# Patient Record
Sex: Female | Born: 1940 | Race: White | Hispanic: No | Smoking: Never smoker
Health system: Southern US, Community
[De-identification: ages and names within clinical notes are randomized; demographics above are authoritative.]

## PROBLEM LIST (undated history)

## (undated) DIAGNOSIS — I701 Atherosclerosis of renal artery: Secondary | ICD-10-CM

## (undated) DIAGNOSIS — S42301A Unspecified fracture of shaft of humerus, right arm, initial encounter for closed fracture: Secondary | ICD-10-CM

## (undated) DIAGNOSIS — N183 Chronic kidney disease, stage 3 unspecified: Secondary | ICD-10-CM

## (undated) DIAGNOSIS — M109 Gout, unspecified: Secondary | ICD-10-CM

## (undated) DIAGNOSIS — L80 Vitiligo: Secondary | ICD-10-CM

## (undated) DIAGNOSIS — I509 Heart failure, unspecified: Secondary | ICD-10-CM

## (undated) DIAGNOSIS — R55 Syncope and collapse: Secondary | ICD-10-CM

## (undated) DIAGNOSIS — E860 Dehydration: Secondary | ICD-10-CM

## (undated) DIAGNOSIS — I10 Essential (primary) hypertension: Secondary | ICD-10-CM

## (undated) DIAGNOSIS — E119 Type 2 diabetes mellitus without complications: Secondary | ICD-10-CM

## (undated) DIAGNOSIS — G629 Polyneuropathy, unspecified: Secondary | ICD-10-CM

## (undated) DIAGNOSIS — I4892 Unspecified atrial flutter: Secondary | ICD-10-CM

## (undated) DIAGNOSIS — I4891 Unspecified atrial fibrillation: Secondary | ICD-10-CM

## (undated) DIAGNOSIS — E039 Hypothyroidism, unspecified: Secondary | ICD-10-CM

## (undated) DIAGNOSIS — I2510 Atherosclerotic heart disease of native coronary artery without angina pectoris: Secondary | ICD-10-CM

## (undated) DIAGNOSIS — N289 Disorder of kidney and ureter, unspecified: Secondary | ICD-10-CM

## (undated) HISTORY — DX: Hypothyroidism, unspecified: E03.9

## (undated) HISTORY — DX: Essential (primary) hypertension: I10

## (undated) HISTORY — PX: CORONARY ARTERY BYPASS GRAFT: SHX141

## (undated) HISTORY — DX: Atherosclerotic heart disease of native coronary artery without angina pectoris: I25.10

## (undated) HISTORY — DX: Disorder of kidney and ureter, unspecified: N28.9

## (undated) HISTORY — DX: Unspecified atrial flutter: I48.92

## (undated) HISTORY — DX: Polyneuropathy, unspecified: G62.9

## (undated) HISTORY — PX: FRACTURE SURGERY: SHX138

## (undated) HISTORY — DX: Atherosclerosis of renal artery: I70.1

## (undated) HISTORY — PX: BREAST EXCISIONAL BIOPSY: SUR124

## (undated) HISTORY — DX: Type 2 diabetes mellitus without complications: E11.9

## (undated) HISTORY — DX: Vitiligo: L80

## (undated) HISTORY — PX: CARDIAC CATHETERIZATION: SHX172

---

## 1997-11-16 ENCOUNTER — Ambulatory Visit (HOSPITAL_COMMUNITY): Admission: RE | Admit: 1997-11-16 | Discharge: 1997-11-16 | Payer: Self-pay | Admitting: *Deleted

## 1998-11-15 ENCOUNTER — Ambulatory Visit (HOSPITAL_COMMUNITY): Admission: RE | Admit: 1998-11-15 | Discharge: 1998-11-15 | Payer: Self-pay | Admitting: Obstetrics & Gynecology

## 1999-11-28 ENCOUNTER — Ambulatory Visit (HOSPITAL_COMMUNITY): Admission: RE | Admit: 1999-11-28 | Discharge: 1999-11-28 | Payer: Self-pay | Admitting: Obstetrics & Gynecology

## 2000-10-22 HISTORY — PX: BREAST BIOPSY: SHX20

## 2000-11-26 ENCOUNTER — Ambulatory Visit (HOSPITAL_COMMUNITY): Admission: RE | Admit: 2000-11-26 | Discharge: 2000-11-26 | Payer: Self-pay | Admitting: *Deleted

## 2001-11-25 ENCOUNTER — Ambulatory Visit (HOSPITAL_COMMUNITY): Admission: RE | Admit: 2001-11-25 | Discharge: 2001-11-25 | Payer: Self-pay | Admitting: *Deleted

## 2004-07-23 ENCOUNTER — Ambulatory Visit: Payer: Self-pay | Admitting: Internal Medicine

## 2004-07-30 ENCOUNTER — Ambulatory Visit: Payer: Self-pay

## 2004-10-15 ENCOUNTER — Observation Stay: Payer: Self-pay | Admitting: Internal Medicine

## 2004-10-15 ENCOUNTER — Other Ambulatory Visit: Payer: Self-pay

## 2005-06-23 ENCOUNTER — Ambulatory Visit: Payer: Self-pay | Admitting: Internal Medicine

## 2005-06-24 ENCOUNTER — Ambulatory Visit: Payer: Self-pay | Admitting: Internal Medicine

## 2005-07-24 ENCOUNTER — Ambulatory Visit: Payer: Self-pay | Admitting: Internal Medicine

## 2005-08-05 ENCOUNTER — Ambulatory Visit: Payer: Self-pay | Admitting: Internal Medicine

## 2006-02-19 ENCOUNTER — Ambulatory Visit: Payer: Self-pay | Admitting: *Deleted

## 2006-06-29 ENCOUNTER — Ambulatory Visit: Payer: Self-pay | Admitting: *Deleted

## 2006-07-08 ENCOUNTER — Ambulatory Visit: Payer: Self-pay | Admitting: *Deleted

## 2006-09-01 ENCOUNTER — Ambulatory Visit: Payer: Self-pay | Admitting: Internal Medicine

## 2006-09-10 ENCOUNTER — Ambulatory Visit: Payer: Self-pay | Admitting: Internal Medicine

## 2006-12-06 ENCOUNTER — Ambulatory Visit: Payer: Self-pay | Admitting: Internal Medicine

## 2007-01-05 ENCOUNTER — Ambulatory Visit: Payer: Self-pay | Admitting: *Deleted

## 2007-01-19 ENCOUNTER — Ambulatory Visit: Payer: Self-pay | Admitting: Pain Medicine

## 2007-03-29 ENCOUNTER — Ambulatory Visit: Payer: Self-pay | Admitting: Pain Medicine

## 2007-04-12 ENCOUNTER — Ambulatory Visit: Payer: Self-pay | Admitting: Physician Assistant

## 2007-04-18 ENCOUNTER — Ambulatory Visit: Payer: Self-pay | Admitting: Pain Medicine

## 2007-04-19 ENCOUNTER — Ambulatory Visit: Payer: Self-pay | Admitting: Pain Medicine

## 2007-05-02 ENCOUNTER — Ambulatory Visit: Payer: Self-pay | Admitting: Pain Medicine

## 2007-05-03 ENCOUNTER — Ambulatory Visit: Payer: Self-pay | Admitting: Pain Medicine

## 2007-05-18 ENCOUNTER — Ambulatory Visit: Payer: Self-pay | Admitting: Physician Assistant

## 2007-07-18 ENCOUNTER — Ambulatory Visit: Payer: Self-pay | Admitting: Physician Assistant

## 2007-08-22 ENCOUNTER — Ambulatory Visit: Payer: Self-pay | Admitting: Pain Medicine

## 2007-08-23 ENCOUNTER — Ambulatory Visit: Payer: Self-pay | Admitting: Internal Medicine

## 2007-10-03 ENCOUNTER — Encounter: Payer: Self-pay | Admitting: Internal Medicine

## 2007-10-11 ENCOUNTER — Ambulatory Visit: Payer: Self-pay | Admitting: Physician Assistant

## 2007-10-11 ENCOUNTER — Ambulatory Visit: Payer: Self-pay | Admitting: Internal Medicine

## 2007-10-23 ENCOUNTER — Encounter: Payer: Self-pay | Admitting: Internal Medicine

## 2007-11-10 ENCOUNTER — Ambulatory Visit: Payer: Self-pay | Admitting: Physician Assistant

## 2007-11-23 ENCOUNTER — Encounter: Payer: Self-pay | Admitting: Internal Medicine

## 2007-11-23 ENCOUNTER — Ambulatory Visit: Payer: Self-pay | Admitting: Oncology

## 2007-12-07 ENCOUNTER — Ambulatory Visit: Payer: Self-pay | Admitting: Physician Assistant

## 2007-12-21 ENCOUNTER — Ambulatory Visit: Payer: Self-pay | Admitting: Oncology

## 2007-12-23 ENCOUNTER — Ambulatory Visit: Payer: Self-pay | Admitting: Oncology

## 2008-01-23 ENCOUNTER — Ambulatory Visit: Payer: Self-pay | Admitting: Oncology

## 2008-02-07 ENCOUNTER — Ambulatory Visit: Payer: Self-pay | Admitting: Physician Assistant

## 2008-02-22 ENCOUNTER — Ambulatory Visit: Payer: Self-pay | Admitting: Oncology

## 2008-03-24 ENCOUNTER — Ambulatory Visit: Payer: Self-pay | Admitting: Oncology

## 2008-04-05 ENCOUNTER — Ambulatory Visit: Payer: Self-pay | Admitting: Oncology

## 2008-04-24 ENCOUNTER — Ambulatory Visit: Payer: Self-pay | Admitting: Oncology

## 2008-05-03 ENCOUNTER — Ambulatory Visit: Payer: Self-pay | Admitting: Physician Assistant

## 2008-07-10 ENCOUNTER — Ambulatory Visit: Payer: Self-pay | Admitting: Gastroenterology

## 2008-11-10 ENCOUNTER — Emergency Department: Payer: Self-pay | Admitting: Emergency Medicine

## 2008-11-15 ENCOUNTER — Ambulatory Visit: Payer: Self-pay | Admitting: Internal Medicine

## 2009-06-11 ENCOUNTER — Ambulatory Visit: Payer: Self-pay | Admitting: Physician Assistant

## 2009-12-25 ENCOUNTER — Ambulatory Visit: Payer: Self-pay | Admitting: Internal Medicine

## 2011-02-27 ENCOUNTER — Ambulatory Visit: Payer: Self-pay | Admitting: Internal Medicine

## 2011-12-15 ENCOUNTER — Ambulatory Visit: Payer: Self-pay

## 2012-01-29 ENCOUNTER — Ambulatory Visit: Payer: Self-pay | Admitting: Anesthesiology

## 2012-02-04 ENCOUNTER — Ambulatory Visit: Payer: Self-pay

## 2012-03-08 ENCOUNTER — Ambulatory Visit: Payer: Self-pay | Admitting: Internal Medicine

## 2012-03-22 ENCOUNTER — Ambulatory Visit: Payer: Self-pay | Admitting: Cardiology

## 2012-03-22 LAB — PROTIME-INR
INR: 1
Prothrombin Time: 13.9 secs (ref 11.5–14.7)

## 2012-10-17 ENCOUNTER — Other Ambulatory Visit: Payer: Self-pay | Admitting: Gastroenterology

## 2012-10-17 LAB — CLOSTRIDIUM DIFFICILE BY PCR

## 2012-11-18 ENCOUNTER — Ambulatory Visit: Payer: Self-pay | Admitting: Gastroenterology

## 2012-11-18 LAB — PROTIME-INR: Prothrombin Time: 15.2 secs — ABNORMAL HIGH (ref 11.5–14.7)

## 2012-11-22 ENCOUNTER — Inpatient Hospital Stay: Payer: Self-pay | Admitting: Family Medicine

## 2012-11-22 LAB — COMPREHENSIVE METABOLIC PANEL
BUN: 23 mg/dL — ABNORMAL HIGH (ref 7–18)
Bilirubin,Total: 0.5 mg/dL (ref 0.2–1.0)
Chloride: 99 mmol/L (ref 98–107)
Co2: 34 mmol/L — ABNORMAL HIGH (ref 21–32)
Creatinine: 1.93 mg/dL — ABNORMAL HIGH (ref 0.60–1.30)
EGFR (African American): 29 — ABNORMAL LOW
EGFR (Non-African Amer.): 25 — ABNORMAL LOW
Osmolality: 280 (ref 275–301)
Potassium: 3.6 mmol/L (ref 3.5–5.1)
SGOT(AST): 34 U/L (ref 15–37)
SGPT (ALT): 19 U/L (ref 12–78)

## 2012-11-22 LAB — CBC
HCT: 33.9 % — ABNORMAL LOW (ref 35.0–47.0)
HGB: 11.7 g/dL — ABNORMAL LOW (ref 12.0–16.0)
RBC: 3.58 10*6/uL — ABNORMAL LOW (ref 3.80–5.20)

## 2012-11-22 LAB — URINALYSIS, COMPLETE
Bilirubin,UR: NEGATIVE
Glucose,UR: 50 mg/dL (ref 0–75)
Ketone: NEGATIVE
Leukocyte Esterase: NEGATIVE
Nitrite: NEGATIVE
Ph: 5 (ref 4.5–8.0)
RBC,UR: <1 /HPF (ref 0–5)
Specific Gravity: 1.004 (ref 1.003–1.030)

## 2012-11-22 LAB — TROPONIN I: Troponin-I: <0.02 ng/mL

## 2012-11-22 LAB — MAGNESIUM: Magnesium: 1.4 mg/dL — ABNORMAL LOW

## 2012-11-23 LAB — BASIC METABOLIC PANEL
Anion Gap: 8 (ref 7–16)
Chloride: 102 mmol/L (ref 98–107)
Creatinine: 1.54 mg/dL — ABNORMAL HIGH (ref 0.60–1.30)
EGFR (Non-African Amer.): 33 — ABNORMAL LOW
Glucose: 149 mg/dL — ABNORMAL HIGH (ref 65–99)
Osmolality: 286 (ref 275–301)
Potassium: 2.8 mmol/L — ABNORMAL LOW (ref 3.5–5.1)

## 2012-11-24 LAB — BASIC METABOLIC PANEL
BUN: 15 mg/dL (ref 7–18)
Chloride: 101 mmol/L (ref 98–107)
Co2: 31 mmol/L (ref 21–32)
Creatinine: 1.4 mg/dL — ABNORMAL HIGH (ref 0.60–1.30)
Glucose: 134 mg/dL — ABNORMAL HIGH (ref 65–99)
Osmolality: 277 (ref 275–301)
Potassium: 3.7 mmol/L (ref 3.5–5.1)

## 2012-11-24 LAB — CBC WITH DIFFERENTIAL/PLATELET
Basophil #: 0 10*3/uL (ref 0.0–0.1)
Basophil %: 1.1 %
Eosinophil #: 0.2 10*3/uL (ref 0.0–0.7)
Eosinophil %: 3.9 %
HCT: 27.1 % — ABNORMAL LOW (ref 35.0–47.0)
HGB: 9.1 g/dL — ABNORMAL LOW (ref 12.0–16.0)
Lymphocyte #: 0.9 10*3/uL — ABNORMAL LOW (ref 1.0–3.6)
Lymphocyte %: 20.7 %
MCH: 32 pg (ref 26.0–34.0)
MCHC: 33.4 g/dL (ref 32.0–36.0)
MCV: 96 fL (ref 80–100)
Monocyte %: 9.2 %
Neutrophil #: 2.9 10*3/uL (ref 1.4–6.5)
Platelet: 79 10*3/uL — ABNORMAL LOW (ref 150–440)

## 2012-11-24 LAB — PROTIME-INR: INR: 1.7

## 2012-11-25 LAB — CBC WITH DIFFERENTIAL/PLATELET
Basophil #: 0 10*3/uL (ref 0.0–0.1)
Basophil %: 1 %
Eosinophil #: 0.2 10*3/uL (ref 0.0–0.7)
Eosinophil %: 4.1 %
HCT: 29 % — ABNORMAL LOW (ref 35.0–47.0)
Lymphocyte #: 1.1 10*3/uL (ref 1.0–3.6)
Lymphocyte %: 22.8 %
MCH: 31.7 pg (ref 26.0–34.0)
Monocyte #: 0.4 x10 3/mm (ref 0.2–0.9)
Monocyte %: 7.8 %
Neutrophil #: 3.1 10*3/uL (ref 1.4–6.5)
Neutrophil %: 64.3 %
Platelet: 89 10*3/uL — ABNORMAL LOW (ref 150–440)
RBC: 3.05 10*6/uL — ABNORMAL LOW (ref 3.80–5.20)
RDW: 17 % — ABNORMAL HIGH (ref 11.5–14.5)
WBC: 4.8 10*3/uL (ref 3.6–11.0)

## 2012-11-25 LAB — BASIC METABOLIC PANEL
Anion Gap: 2 — ABNORMAL LOW (ref 7–16)
BUN: 20 mg/dL — ABNORMAL HIGH (ref 7–18)
Chloride: 104 mmol/L (ref 98–107)
Creatinine: 1.29 mg/dL (ref 0.60–1.30)
EGFR (Non-African Amer.): 41 — ABNORMAL LOW
Glucose: 135 mg/dL — ABNORMAL HIGH (ref 65–99)
Potassium: 4.4 mmol/L (ref 3.5–5.1)
Sodium: 141 mmol/L (ref 136–145)

## 2012-11-25 LAB — FERRITIN: Ferritin (ARMC): 28 ng/mL (ref 8–388)

## 2012-11-25 LAB — FOLATE: Folic Acid: 8.7 ng/mL (ref 3.1–100.0)

## 2012-11-25 LAB — IRON AND TIBC: Iron: 55 ug/dL (ref 50–170)

## 2012-11-25 LAB — PROTIME-INR: Prothrombin Time: 18.3 secs — ABNORMAL HIGH (ref 11.5–14.7)

## 2012-11-26 LAB — CBC WITH DIFFERENTIAL/PLATELET
HCT: 28.6 % — ABNORMAL LOW (ref 35.0–47.0)
HGB: 9.6 g/dL — ABNORMAL LOW (ref 12.0–16.0)
Lymphocyte #: 1.4 10*3/uL (ref 1.0–3.6)
MCH: 32 pg (ref 26.0–34.0)
MCHC: 33.6 g/dL (ref 32.0–36.0)
Monocyte %: 7.5 %
Neutrophil %: 56.4 %
Platelet: 90 10*3/uL — ABNORMAL LOW (ref 150–440)
RDW: 16.6 % — ABNORMAL HIGH (ref 11.5–14.5)
WBC: 4.5 10*3/uL (ref 3.6–11.0)

## 2012-11-26 LAB — BASIC METABOLIC PANEL
Calcium, Total: 9 mg/dL (ref 8.5–10.1)
Creatinine: 1.36 mg/dL — ABNORMAL HIGH (ref 0.60–1.30)
EGFR (Non-African Amer.): 39 — ABNORMAL LOW
Osmolality: 282 (ref 275–301)
Potassium: 4.8 mmol/L (ref 3.5–5.1)

## 2012-11-26 LAB — PROTIME-INR: Prothrombin Time: 17.9 secs — ABNORMAL HIGH (ref 11.5–14.7)

## 2012-11-27 LAB — HEMOGLOBIN: HGB: 9.8 g/dL — ABNORMAL LOW (ref 12.0–16.0)

## 2012-11-27 LAB — PROTIME-INR
INR: 1.5
Prothrombin Time: 18.4 secs — ABNORMAL HIGH (ref 11.5–14.7)

## 2012-12-27 ENCOUNTER — Inpatient Hospital Stay: Payer: Self-pay | Admitting: Internal Medicine

## 2012-12-27 LAB — URINALYSIS, COMPLETE
Bilirubin,UR: NEGATIVE
Glucose,UR: NEGATIVE mg/dL (ref 0–75)
Granular Cast: 5
Leukocyte Esterase: NEGATIVE
Ph: 5 (ref 4.5–8.0)
Protein: NEGATIVE
Specific Gravity: 1.011 (ref 1.003–1.030)

## 2012-12-27 LAB — COMPREHENSIVE METABOLIC PANEL
Alkaline Phosphatase: 121 U/L (ref 50–136)
Anion Gap: 8 (ref 7–16)
Bilirubin,Total: 0.4 mg/dL (ref 0.2–1.0)
Chloride: 112 mmol/L — ABNORMAL HIGH (ref 98–107)
Co2: 20 mmol/L — ABNORMAL LOW (ref 21–32)
EGFR (African American): 22 — ABNORMAL LOW
EGFR (Non-African Amer.): 19 — ABNORMAL LOW
Glucose: 111 mg/dL — ABNORMAL HIGH (ref 65–99)
SGPT (ALT): 16 U/L (ref 12–78)
Sodium: 140 mmol/L (ref 136–145)
Total Protein: 7.1 g/dL (ref 6.4–8.2)

## 2012-12-27 LAB — PROTIME-INR
INR: 4.2
Prothrombin Time: 38.7 secs — ABNORMAL HIGH (ref 11.5–14.7)

## 2012-12-27 LAB — CBC
MCHC: 33.2 g/dL (ref 32.0–36.0)
MCV: 99 fL (ref 80–100)
RBC: 3.21 10*6/uL — ABNORMAL LOW (ref 3.80–5.20)
RDW: 19.2 % — ABNORMAL HIGH (ref 11.5–14.5)

## 2012-12-27 LAB — POTASSIUM: Potassium: 5.9 mmol/L — ABNORMAL HIGH (ref 3.5–5.1)

## 2012-12-28 LAB — BASIC METABOLIC PANEL
Calcium, Total: 8.8 mg/dL (ref 8.5–10.1)
Co2: 22 mmol/L (ref 21–32)
Creatinine: 2.01 mg/dL — ABNORMAL HIGH (ref 0.60–1.30)
EGFR (Non-African Amer.): 24 — ABNORMAL LOW
Glucose: 94 mg/dL (ref 65–99)
Potassium: 5.4 mmol/L — ABNORMAL HIGH (ref 3.5–5.1)

## 2012-12-28 LAB — CBC WITH DIFFERENTIAL/PLATELET
Basophil #: 0 10*3/uL (ref 0.0–0.1)
Basophil %: 1.2 %
Eosinophil %: 3.8 %
HCT: 27.2 % — ABNORMAL LOW (ref 35.0–47.0)
Lymphocyte #: 1.2 10*3/uL (ref 1.0–3.6)
Lymphocyte %: 28.9 %
MCH: 32.6 pg (ref 26.0–34.0)
MCHC: 33.6 g/dL (ref 32.0–36.0)
Monocyte #: 0.3 x10 3/mm (ref 0.2–0.9)
Monocyte %: 8.3 %
Neutrophil %: 57.8 %
Platelet: 75 10*3/uL — ABNORMAL LOW (ref 150–440)
RDW: 19 % — ABNORMAL HIGH (ref 11.5–14.5)
WBC: 4 10*3/uL (ref 3.6–11.0)

## 2012-12-28 LAB — CLOSTRIDIUM DIFFICILE BY PCR

## 2012-12-29 LAB — CBC WITH DIFFERENTIAL/PLATELET
Basophil %: 0.6 %
Eosinophil %: 3.9 %
HGB: 9 g/dL — ABNORMAL LOW (ref 12.0–16.0)
Lymphocyte #: 1 10*3/uL (ref 1.0–3.6)
Lymphocyte %: 29.8 %
MCHC: 34.1 g/dL (ref 32.0–36.0)
MCV: 98 fL (ref 80–100)
Monocyte #: 0.2 x10 3/mm (ref 0.2–0.9)
Monocyte %: 6.8 %
Neutrophil #: 2 10*3/uL (ref 1.4–6.5)
Neutrophil %: 58.9 %
Platelet: 68 10*3/uL — ABNORMAL LOW (ref 150–440)
RBC: 2.71 10*6/uL — ABNORMAL LOW (ref 3.80–5.20)
WBC: 3.4 10*3/uL — ABNORMAL LOW (ref 3.6–11.0)

## 2012-12-29 LAB — BASIC METABOLIC PANEL
Anion Gap: 8 (ref 7–16)
BUN: 30 mg/dL — ABNORMAL HIGH (ref 7–18)
Calcium, Total: 8.5 mg/dL (ref 8.5–10.1)
Co2: 21 mmol/L (ref 21–32)
Creatinine: 1.49 mg/dL — ABNORMAL HIGH (ref 0.60–1.30)
EGFR (African American): 40 — ABNORMAL LOW
EGFR (Non-African Amer.): 34 — ABNORMAL LOW
Glucose: 110 mg/dL — ABNORMAL HIGH (ref 65–99)
Osmolality: 296 (ref 275–301)
Potassium: 5 mmol/L (ref 3.5–5.1)
Sodium: 145 mmol/L (ref 136–145)

## 2012-12-29 LAB — PROTIME-INR: Prothrombin Time: 31.5 secs — ABNORMAL HIGH (ref 11.5–14.7)

## 2012-12-30 LAB — CBC WITH DIFFERENTIAL/PLATELET
Basophil %: 0.6 %
Eosinophil #: 0.1 10*3/uL (ref 0.0–0.7)
HCT: 24.7 % — ABNORMAL LOW (ref 35.0–47.0)
Lymphocyte #: 0.7 10*3/uL — ABNORMAL LOW (ref 1.0–3.6)
Lymphocyte %: 20.8 %
MCH: 32.7 pg (ref 26.0–34.0)
MCHC: 34 g/dL (ref 32.0–36.0)
MCV: 96 fL (ref 80–100)
Monocyte #: 0.2 x10 3/mm (ref 0.2–0.9)
Monocyte %: 7 %
Neutrophil %: 68.1 %
RDW: 19.1 % — ABNORMAL HIGH (ref 11.5–14.5)
WBC: 3.3 10*3/uL — ABNORMAL LOW (ref 3.6–11.0)

## 2012-12-30 LAB — BASIC METABOLIC PANEL
BUN: 18 mg/dL (ref 7–18)
Calcium, Total: 8.4 mg/dL — ABNORMAL LOW (ref 8.5–10.1)
Creatinine: 1.2 mg/dL (ref 0.60–1.30)
EGFR (African American): 52 — ABNORMAL LOW
Glucose: 126 mg/dL — ABNORMAL HIGH (ref 65–99)
Sodium: 139 mmol/L (ref 136–145)

## 2012-12-30 LAB — IRON AND TIBC
Iron: 55 ug/dL (ref 50–170)
Unbound Iron-Bind.Cap.: 185 ug/dL

## 2013-01-19 ENCOUNTER — Ambulatory Visit: Payer: Self-pay | Admitting: Gastroenterology

## 2013-01-27 ENCOUNTER — Other Ambulatory Visit: Payer: Self-pay | Admitting: Gastroenterology

## 2013-01-27 LAB — CLOSTRIDIUM DIFFICILE BY PCR

## 2013-04-12 ENCOUNTER — Ambulatory Visit: Payer: Self-pay | Admitting: Internal Medicine

## 2013-04-24 ENCOUNTER — Inpatient Hospital Stay: Payer: Self-pay | Admitting: Internal Medicine

## 2013-04-24 LAB — CBC
HCT: 30.5 % — ABNORMAL LOW (ref 35.0–47.0)
HGB: 10.2 g/dL — ABNORMAL LOW (ref 12.0–16.0)
MCV: 104 fL — ABNORMAL HIGH (ref 80–100)
Platelet: 108 10*3/uL — ABNORMAL LOW (ref 150–440)
RBC: 2.94 10*6/uL — ABNORMAL LOW (ref 3.80–5.20)
RDW: 15.4 % — ABNORMAL HIGH (ref 11.5–14.5)
WBC: 6.1 10*3/uL (ref 3.6–11.0)

## 2013-04-24 LAB — URINALYSIS, COMPLETE
Bilirubin,UR: NEGATIVE
Glucose,UR: NEGATIVE mg/dL (ref 0–75)
Hyaline Cast: 16
Ketone: NEGATIVE
RBC,UR: 21 /HPF (ref 0–5)
Specific Gravity: 1.016 (ref 1.003–1.030)
Squamous Epithelial: NONE SEEN
WBC UR: 253 /HPF (ref 0–5)

## 2013-04-24 LAB — COMPREHENSIVE METABOLIC PANEL
Alkaline Phosphatase: 125 U/L (ref 50–136)
BUN: 26 mg/dL — ABNORMAL HIGH (ref 7–18)
Bilirubin,Total: 0.6 mg/dL (ref 0.2–1.0)
Calcium, Total: 8.6 mg/dL (ref 8.5–10.1)
Co2: 26 mmol/L (ref 21–32)
EGFR (Non-African Amer.): 24 — ABNORMAL LOW
Osmolality: 282 (ref 275–301)
SGOT(AST): 23 U/L (ref 15–37)
Sodium: 139 mmol/L (ref 136–145)

## 2013-04-24 LAB — BASIC METABOLIC PANEL
Anion Gap: 7 (ref 7–16)
Calcium, Total: 8.3 mg/dL — ABNORMAL LOW (ref 8.5–10.1)
Chloride: 108 mmol/L — ABNORMAL HIGH (ref 98–107)
Co2: 26 mmol/L (ref 21–32)
Creatinine: 1.77 mg/dL — ABNORMAL HIGH (ref 0.60–1.30)
EGFR (African American): 32 — ABNORMAL LOW
EGFR (Non-African Amer.): 28 — ABNORMAL LOW
Osmolality: 285 (ref 275–301)
Sodium: 141 mmol/L (ref 136–145)

## 2013-04-24 LAB — PROTIME-INR: INR: 2.1

## 2013-04-25 LAB — CBC WITH DIFFERENTIAL/PLATELET
Eosinophil #: 0.2 10*3/uL (ref 0.0–0.7)
HCT: 25 % — ABNORMAL LOW (ref 35.0–47.0)
HGB: 8.5 g/dL — ABNORMAL LOW (ref 12.0–16.0)
Lymphocyte #: 0.8 10*3/uL — ABNORMAL LOW (ref 1.0–3.6)
MCH: 35.4 pg — ABNORMAL HIGH (ref 26.0–34.0)
MCHC: 34.1 g/dL (ref 32.0–36.0)
Monocyte #: 0.3 x10 3/mm (ref 0.2–0.9)
Platelet: 87 10*3/uL — ABNORMAL LOW (ref 150–440)
RBC: 2.42 10*6/uL — ABNORMAL LOW (ref 3.80–5.20)
RDW: 14.8 % — ABNORMAL HIGH (ref 11.5–14.5)

## 2013-04-25 LAB — BASIC METABOLIC PANEL
Anion Gap: 8 (ref 7–16)
Calcium, Total: 7.9 mg/dL — ABNORMAL LOW (ref 8.5–10.1)
Creatinine: 1.57 mg/dL — ABNORMAL HIGH (ref 0.60–1.30)
EGFR (Non-African Amer.): 32 — ABNORMAL LOW
Glucose: 109 mg/dL — ABNORMAL HIGH (ref 65–99)
Osmolality: 284 (ref 275–301)
Potassium: 3.5 mmol/L (ref 3.5–5.1)
Sodium: 140 mmol/L (ref 136–145)

## 2013-04-25 LAB — PROTIME-INR: Prothrombin Time: 23.5 secs — ABNORMAL HIGH (ref 11.5–14.7)

## 2013-04-26 LAB — CBC WITH DIFFERENTIAL/PLATELET
Basophil #: 0 10*3/uL (ref 0.0–0.1)
Eosinophil #: 0.2 10*3/uL (ref 0.0–0.7)
Eosinophil %: 4.6 %
HGB: 8 g/dL — ABNORMAL LOW (ref 12.0–16.0)
Lymphocyte %: 21.4 %
Monocyte #: 0.3 x10 3/mm (ref 0.2–0.9)
Monocyte %: 7.9 %
Platelet: 83 10*3/uL — ABNORMAL LOW (ref 150–440)
RDW: 15 % — ABNORMAL HIGH (ref 11.5–14.5)
WBC: 3.7 10*3/uL (ref 3.6–11.0)

## 2013-04-26 LAB — BASIC METABOLIC PANEL
BUN: 20 mg/dL — ABNORMAL HIGH (ref 7–18)
Chloride: 110 mmol/L — ABNORMAL HIGH (ref 98–107)
Co2: 24 mmol/L (ref 21–32)
Osmolality: 281 (ref 275–301)
Potassium: 3.6 mmol/L (ref 3.5–5.1)

## 2013-04-26 LAB — PROTIME-INR: Prothrombin Time: 24.6 secs — ABNORMAL HIGH (ref 11.5–14.7)

## 2013-04-27 LAB — CBC WITH DIFFERENTIAL/PLATELET
Basophil #: 0 10*3/uL (ref 0.0–0.1)
Eosinophil #: 0.2 10*3/uL (ref 0.0–0.7)
Eosinophil %: 4.4 %
HCT: 26.2 % — ABNORMAL LOW (ref 35.0–47.0)
HGB: 8.7 g/dL — ABNORMAL LOW (ref 12.0–16.0)
Lymphocyte %: 18.8 %
MCHC: 33.1 g/dL (ref 32.0–36.0)
Monocyte #: 0.4 x10 3/mm (ref 0.2–0.9)
Monocyte %: 8.7 %
Neutrophil #: 3 10*3/uL (ref 1.4–6.5)
Neutrophil %: 67.4 %
Platelet: 94 10*3/uL — ABNORMAL LOW (ref 150–440)
RBC: 2.51 10*6/uL — ABNORMAL LOW (ref 3.80–5.20)
RDW: 15.4 % — ABNORMAL HIGH (ref 11.5–14.5)
WBC: 4.5 10*3/uL (ref 3.6–11.0)

## 2013-04-27 LAB — BASIC METABOLIC PANEL
BUN: 20 mg/dL — ABNORMAL HIGH (ref 7–18)
Calcium, Total: 8.2 mg/dL — ABNORMAL LOW (ref 8.5–10.1)
Chloride: 113 mmol/L — ABNORMAL HIGH (ref 98–107)
Co2: 24 mmol/L (ref 21–32)
EGFR (African American): 38 — ABNORMAL LOW
EGFR (Non-African Amer.): 32 — ABNORMAL LOW
Osmolality: 286 (ref 275–301)
Potassium: 4.7 mmol/L (ref 3.5–5.1)
Sodium: 142 mmol/L (ref 136–145)

## 2013-04-27 LAB — PROTIME-INR: Prothrombin Time: 24.3 secs — ABNORMAL HIGH (ref 11.5–14.7)

## 2013-04-28 ENCOUNTER — Encounter: Payer: Self-pay | Admitting: Internal Medicine

## 2013-04-28 LAB — CBC WITH DIFFERENTIAL/PLATELET
Basophil #: 0 10*3/uL (ref 0.0–0.1)
Basophil %: 0.7 %
HCT: 25.7 % — ABNORMAL LOW (ref 35.0–47.0)
HGB: 8.8 g/dL — ABNORMAL LOW (ref 12.0–16.0)
MCHC: 34.5 g/dL (ref 32.0–36.0)
MCV: 103 fL — ABNORMAL HIGH (ref 80–100)
Neutrophil #: 3.8 10*3/uL (ref 1.4–6.5)
Neutrophil %: 69.8 %
Platelet: 108 10*3/uL — ABNORMAL LOW (ref 150–440)
RDW: 15 % — ABNORMAL HIGH (ref 11.5–14.5)

## 2013-04-28 LAB — BASIC METABOLIC PANEL WITH GFR
Anion Gap: 7
BUN: 17 mg/dL
Calcium, Total: 8.9 mg/dL
Chloride: 103 mmol/L
Co2: 29 mmol/L
Creatinine: 1.57 mg/dL — ABNORMAL HIGH
EGFR (African American): 37 — ABNORMAL LOW
EGFR (Non-African Amer.): 32 — ABNORMAL LOW
Glucose: 94 mg/dL
Osmolality: 279
Potassium: 4 mmol/L
Sodium: 139 mmol/L

## 2013-04-28 LAB — PROTIME-INR
INR: 2.1
Prothrombin Time: 23.1 secs — ABNORMAL HIGH (ref 11.5–14.7)

## 2013-05-01 LAB — CBC WITH DIFFERENTIAL/PLATELET
Basophil %: 0.8 %
Eosinophil %: 4.5 %
Lymphocyte #: 0.8 10*3/uL — ABNORMAL LOW (ref 1.0–3.6)
Lymphocyte %: 18.1 %
MCH: 34.7 pg — ABNORMAL HIGH (ref 26.0–34.0)
MCHC: 33.9 g/dL (ref 32.0–36.0)
MCV: 102 fL — ABNORMAL HIGH (ref 80–100)
Monocyte #: 0.2 x10 3/mm (ref 0.2–0.9)
Monocyte %: 5.7 %
Neutrophil %: 70.9 %
RBC: 2.53 10*6/uL — ABNORMAL LOW (ref 3.80–5.20)
RDW: 14.9 % — ABNORMAL HIGH (ref 11.5–14.5)
WBC: 4.4 10*3/uL (ref 3.6–11.0)

## 2013-05-01 LAB — BASIC METABOLIC PANEL
Anion Gap: 4 — ABNORMAL LOW (ref 7–16)
BUN: 18 mg/dL (ref 7–18)
Calcium, Total: 8.5 mg/dL (ref 8.5–10.1)
Chloride: 103 mmol/L (ref 98–107)
Co2: 32 mmol/L (ref 21–32)
Creatinine: 1.53 mg/dL — ABNORMAL HIGH (ref 0.60–1.30)
EGFR (African American): 38 — ABNORMAL LOW
EGFR (Non-African Amer.): 33 — ABNORMAL LOW
Glucose: 102 mg/dL — ABNORMAL HIGH (ref 65–99)
Osmolality: 280 (ref 275–301)

## 2013-05-01 LAB — PROTIME-INR: INR: 1.9

## 2013-05-04 LAB — CBC WITH DIFFERENTIAL/PLATELET
Basophil #: 0 10*3/uL (ref 0.0–0.1)
Basophil %: 0.9 %
Eosinophil #: 0.2 10*3/uL (ref 0.0–0.7)
Eosinophil %: 4.6 %
HCT: 25.6 % — ABNORMAL LOW (ref 35.0–47.0)
HGB: 8.4 g/dL — ABNORMAL LOW (ref 12.0–16.0)
Lymphocyte #: 0.8 10*3/uL — ABNORMAL LOW (ref 1.0–3.6)
MCH: 33.7 pg (ref 26.0–34.0)
MCHC: 32.7 g/dL (ref 32.0–36.0)
Monocyte #: 0.3 x10 3/mm (ref 0.2–0.9)
Neutrophil %: 67.7 %
Platelet: 102 10*3/uL — ABNORMAL LOW (ref 150–440)

## 2013-05-04 LAB — PROTIME-INR
INR: 2.7
Prothrombin Time: 27.6 secs — ABNORMAL HIGH (ref 11.5–14.7)

## 2013-05-04 LAB — BASIC METABOLIC PANEL
BUN: 24 mg/dL — ABNORMAL HIGH (ref 7–18)
Calcium, Total: 9.1 mg/dL (ref 8.5–10.1)
Chloride: 105 mmol/L (ref 98–107)
Co2: 32 mmol/L (ref 21–32)
Creatinine: 1.45 mg/dL — ABNORMAL HIGH (ref 0.60–1.30)
Potassium: 4.9 mmol/L (ref 3.5–5.1)
Sodium: 140 mmol/L (ref 136–145)

## 2013-05-09 LAB — BASIC METABOLIC PANEL
BUN: 26 mg/dL — ABNORMAL HIGH (ref 7–18)
Chloride: 101 mmol/L (ref 98–107)
Co2: 32 mmol/L (ref 21–32)
EGFR (African American): 38 — ABNORMAL LOW
Glucose: 113 mg/dL — ABNORMAL HIGH (ref 65–99)
Osmolality: 281 (ref 275–301)
Sodium: 138 mmol/L (ref 136–145)

## 2013-05-09 LAB — CBC WITH DIFFERENTIAL/PLATELET
Basophil #: 0 x10 3/mm 3
Basophil %: 0.7 %
Eosinophil #: 0.2 x10 3/mm 3
Eosinophil %: 3.3 %
HCT: 26.7 % — ABNORMAL LOW
HGB: 8.9 g/dL — ABNORMAL LOW
Lymphocyte %: 16.5 %
Lymphs Abs: 0.8 x10 3/mm 3 — ABNORMAL LOW
MCH: 35 pg — ABNORMAL HIGH
MCHC: 33.4 g/dL
MCV: 105 fL — ABNORMAL HIGH
Monocyte #: 0.3 "x10 3/mm "
Monocyte %: 5.9 %
Neutrophil #: 3.7 x10 3/mm 3
Neutrophil %: 73.6 %
Platelet: 114 x10 3/mm 3 — ABNORMAL LOW
RBC: 2.56 X10 6/mm 3 — ABNORMAL LOW
RDW: 16.5 % — ABNORMAL HIGH
WBC: 5.1 x10 3/mm 3

## 2013-05-09 LAB — PROTIME-INR
INR: 2.3
Prothrombin Time: 24.8 secs — ABNORMAL HIGH (ref 11.5–14.7)

## 2013-05-24 ENCOUNTER — Ambulatory Visit: Payer: Self-pay | Admitting: Oncology

## 2013-06-07 ENCOUNTER — Inpatient Hospital Stay: Payer: Self-pay | Admitting: Internal Medicine

## 2013-06-07 LAB — COMPREHENSIVE METABOLIC PANEL
Alkaline Phosphatase: 94 U/L (ref 50–136)
BUN: 71 mg/dL — ABNORMAL HIGH (ref 7–18)
Bilirubin,Total: 0.3 mg/dL (ref 0.2–1.0)
Calcium, Total: 8.8 mg/dL (ref 8.5–10.1)
Creatinine: 5.9 mg/dL — ABNORMAL HIGH (ref 0.60–1.30)
EGFR (Non-African Amer.): 6 — ABNORMAL LOW
Glucose: 86 mg/dL (ref 65–99)
Potassium: 6.7 mmol/L (ref 3.5–5.1)
SGPT (ALT): 16 U/L (ref 12–78)
Sodium: 134 mmol/L — ABNORMAL LOW (ref 136–145)
Total Protein: 6.8 g/dL (ref 6.4–8.2)

## 2013-06-07 LAB — URINALYSIS, COMPLETE
Bacteria: NONE SEEN
Bilirubin,UR: NEGATIVE
Glucose,UR: NEGATIVE mg/dL (ref 0–75)
Hyaline Cast: 2
Ketone: NEGATIVE
Leukocyte Esterase: NEGATIVE
Nitrite: NEGATIVE
Ph: 5 (ref 4.5–8.0)
Protein: 30
Specific Gravity: 1.014 (ref 1.003–1.030)
WBC UR: 3 /HPF (ref 0–5)

## 2013-06-07 LAB — TROPONIN I: Troponin-I: <0.02 ng/mL

## 2013-06-07 LAB — CBC
HCT: 30.2 % — ABNORMAL LOW (ref 35.0–47.0)
HGB: 10.3 g/dL — ABNORMAL LOW (ref 12.0–16.0)
MCH: 35.5 pg — ABNORMAL HIGH (ref 26.0–34.0)
MCV: 104 fL — ABNORMAL HIGH (ref 80–100)
Platelet: 109 10*3/uL — ABNORMAL LOW (ref 150–440)
RDW: 15.8 % — ABNORMAL HIGH (ref 11.5–14.5)
WBC: 6 10*3/uL (ref 3.6–11.0)

## 2013-06-07 LAB — POTASSIUM: Potassium: 6.3 mmol/L — ABNORMAL HIGH (ref 3.5–5.1)

## 2013-06-08 LAB — PROTEIN / CREATININE RATIO, URINE: Protein/Creat. Ratio: 407 mg/gCREAT — ABNORMAL HIGH (ref 0–200)

## 2013-06-08 LAB — SODIUM, URINE, RANDOM: Sodium, Urine Random: 42 mmol/L (ref 20–110)

## 2013-06-08 LAB — IRON AND TIBC
Iron Bind.Cap.(Total): 307 ug/dL (ref 250–450)
Iron: 73 ug/dL (ref 50–170)
Unbound Iron-Bind.Cap.: 234 ug/dL

## 2013-06-08 LAB — BASIC METABOLIC PANEL
Anion Gap: 9 (ref 7–16)
Chloride: 108 mmol/L — ABNORMAL HIGH (ref 98–107)
Creatinine: 5.55 mg/dL — ABNORMAL HIGH (ref 0.60–1.30)
EGFR (African American): 8 — ABNORMAL LOW
EGFR (Non-African Amer.): 7 — ABNORMAL LOW
Glucose: 75 mg/dL (ref 65–99)
Osmolality: 291 (ref 275–301)
Potassium: 5.8 mmol/L — ABNORMAL HIGH (ref 3.5–5.1)
Sodium: 136 mmol/L (ref 136–145)

## 2013-06-08 LAB — PHOSPHORUS: Phosphorus: 4.8 mg/dL (ref 2.5–4.9)

## 2013-06-08 LAB — POTASSIUM: Potassium: 5.7 mmol/L — ABNORMAL HIGH (ref 3.5–5.1)

## 2013-06-08 LAB — FERRITIN: Ferritin (ARMC): 67 ng/mL (ref 8–388)

## 2013-06-09 LAB — BASIC METABOLIC PANEL
Anion Gap: 5 — ABNORMAL LOW (ref 7–16)
Co2: 26 mmol/L (ref 21–32)
Creatinine: 3.8 mg/dL — ABNORMAL HIGH (ref 0.60–1.30)
EGFR (African American): 13 — ABNORMAL LOW
Glucose: 124 mg/dL — ABNORMAL HIGH (ref 65–99)
Osmolality: 295 (ref 275–301)
Potassium: 5.5 mmol/L — ABNORMAL HIGH (ref 3.5–5.1)
Sodium: 139 mmol/L (ref 136–145)

## 2013-06-09 LAB — CBC WITH DIFFERENTIAL/PLATELET
Basophil #: 0 10*3/uL (ref 0.0–0.1)
Basophil %: 1.2 %
Basophil %: 1.3 %
Eosinophil #: 0.2 10*3/uL (ref 0.0–0.7)
Eosinophil %: 5.2 %
HCT: 23.8 % — ABNORMAL LOW (ref 35.0–47.0)
HGB: 8.2 g/dL — ABNORMAL LOW (ref 12.0–16.0)
HGB: 8.3 g/dL — ABNORMAL LOW (ref 12.0–16.0)
Lymphocyte #: 1 10*3/uL (ref 1.0–3.6)
Lymphocyte %: 24.8 %
MCH: 35.3 pg — ABNORMAL HIGH (ref 26.0–34.0)
MCH: 34.6 pg — ABNORMAL HIGH (ref 26.0–34.0)
Monocyte #: 0.2 x10 3/mm (ref 0.2–0.9)
Monocyte #: 0.2 x10 3/mm (ref 0.2–0.9)
Monocyte %: 6.2 %
Monocyte %: 7.2 %
Neutrophil #: 2.2 10*3/uL (ref 1.4–6.5)
Neutrophil #: 1.5 10*3/uL (ref 1.4–6.5)
Neutrophil %: 64.4 %
Neutrophil %: 52.8 %
Platelet: 66 10*3/uL — ABNORMAL LOW (ref 150–440)
RBC: 2.31 10*6/uL — ABNORMAL LOW (ref 3.80–5.20)
RBC: 2.41 10*6/uL — ABNORMAL LOW (ref 3.80–5.20)
WBC: 3.4 10*3/uL — ABNORMAL LOW (ref 3.6–11.0)
WBC: 2.9 10*3/uL — ABNORMAL LOW (ref 3.6–11.0)

## 2013-06-09 LAB — PROTIME-INR
INR: 7.8
Prothrombin Time: 61.5 secs — ABNORMAL HIGH (ref 11.5–14.7)
Prothrombin Time: 34.4 secs — ABNORMAL HIGH (ref 11.5–14.7)

## 2013-06-09 LAB — LACTATE DEHYDROGENASE: LDH: 175 U/L (ref 81–246)

## 2013-06-09 LAB — URIC ACID: Uric Acid: 3 mg/dL (ref 2.6–6.0)

## 2013-06-10 LAB — PROTIME-INR: Prothrombin Time: 26.1 secs — ABNORMAL HIGH (ref 11.5–14.7)

## 2013-06-10 LAB — BASIC METABOLIC PANEL
BUN: 46 mg/dL — ABNORMAL HIGH (ref 7–18)
Co2: 27 mmol/L (ref 21–32)
EGFR (African American): 20 — ABNORMAL LOW
EGFR (Non-African Amer.): 17 — ABNORMAL LOW
Glucose: 109 mg/dL — ABNORMAL HIGH (ref 65–99)
Osmolality: 292 (ref 275–301)
Sodium: 140 mmol/L (ref 136–145)

## 2013-06-10 LAB — CBC WITH DIFFERENTIAL/PLATELET
Basophil %: 1.3 %
Eosinophil %: 6.8 %
HCT: 24.4 % — ABNORMAL LOW (ref 35.0–47.0)
Lymphocyte #: 0.9 10*3/uL — ABNORMAL LOW (ref 1.0–3.6)
Lymphocyte %: 33.7 %
MCH: 35.1 pg — ABNORMAL HIGH (ref 26.0–34.0)
MCV: 103 fL — ABNORMAL HIGH (ref 80–100)
Monocyte %: 6.3 %
Neutrophil #: 1.4 10*3/uL (ref 1.4–6.5)
RBC: 2.36 10*6/uL — ABNORMAL LOW (ref 3.80–5.20)
RDW: 15.4 % — ABNORMAL HIGH (ref 11.5–14.5)

## 2013-06-11 LAB — CBC WITH DIFFERENTIAL/PLATELET
Basophil #: 0 10*3/uL (ref 0.0–0.1)
Basophil %: 0.8 %
Eosinophil #: 0.2 10*3/uL (ref 0.0–0.7)
Lymphocyte #: 1 10*3/uL (ref 1.0–3.6)
MCV: 103 fL — ABNORMAL HIGH (ref 80–100)
Monocyte #: 0.2 x10 3/mm (ref 0.2–0.9)
Neutrophil #: 2.1 10*3/uL (ref 1.4–6.5)
Platelet: 60 10*3/uL — ABNORMAL LOW (ref 150–440)
RDW: 15.2 % — ABNORMAL HIGH (ref 11.5–14.5)
WBC: 3.5 10*3/uL — ABNORMAL LOW (ref 3.6–11.0)

## 2013-06-11 LAB — COMPREHENSIVE METABOLIC PANEL
Anion Gap: 4 — ABNORMAL LOW (ref 7–16)
Calcium, Total: 9 mg/dL (ref 8.5–10.1)
Chloride: 110 mmol/L — ABNORMAL HIGH (ref 98–107)
Creatinine: 2.14 mg/dL — ABNORMAL HIGH (ref 0.60–1.30)
EGFR (African American): 26 — ABNORMAL LOW
EGFR (Non-African Amer.): 22 — ABNORMAL LOW
Glucose: 71 mg/dL (ref 65–99)
Osmolality: 287 (ref 275–301)
SGPT (ALT): 16 U/L (ref 12–78)
Sodium: 140 mmol/L (ref 136–145)
Total Protein: 5.5 g/dL — ABNORMAL LOW (ref 6.4–8.2)

## 2013-06-11 LAB — PROTIME-INR
INR: 1.6
Prothrombin Time: 18.7 secs — ABNORMAL HIGH (ref 11.5–14.7)

## 2013-06-11 LAB — RENAL FUNCTION PANEL
Anion Gap: 2 — ABNORMAL LOW (ref 7–16)
BUN: 32 mg/dL — ABNORMAL HIGH (ref 7–18)
Calcium, Total: 8.8 mg/dL (ref 8.5–10.1)
Chloride: 108 mmol/L — ABNORMAL HIGH (ref 98–107)
EGFR (African American): 27 — ABNORMAL LOW
Glucose: 84 mg/dL (ref 65–99)
Osmolality: 282 (ref 275–301)
Phosphorus: 3.2 mg/dL (ref 2.5–4.9)
Potassium: 5.9 mmol/L — ABNORMAL HIGH (ref 3.5–5.1)

## 2013-06-12 ENCOUNTER — Ambulatory Visit: Payer: Self-pay | Admitting: Oncology

## 2013-06-12 LAB — BASIC METABOLIC PANEL
BUN: 29 mg/dL — ABNORMAL HIGH (ref 7–18)
Calcium, Total: 9.1 mg/dL (ref 8.5–10.1)
Chloride: 109 mmol/L — ABNORMAL HIGH (ref 98–107)
Creatinine: 2 mg/dL — ABNORMAL HIGH (ref 0.60–1.30)
EGFR (African American): 28 — ABNORMAL LOW
Sodium: 140 mmol/L (ref 136–145)

## 2013-06-12 LAB — PROTEIN / CREATININE RATIO, URINE
Creatinine, Urine: 67 mg/dL (ref 30.0–125.0)
Protein, Random Urine: 44 mg/dL — ABNORMAL HIGH (ref 0–12)

## 2013-06-12 LAB — CBC WITH DIFFERENTIAL/PLATELET
Basophil #: 0 10*3/uL (ref 0.0–0.1)
Basophil %: 0.7 %
Eosinophil #: 0.2 10*3/uL (ref 0.0–0.7)
Eosinophil %: 4.2 %
HCT: 27.4 % — ABNORMAL LOW (ref 35.0–47.0)
HGB: 9.2 g/dL — ABNORMAL LOW (ref 12.0–16.0)
Lymphocyte %: 19.7 %
MCHC: 33.4 g/dL (ref 32.0–36.0)
Monocyte #: 0.2 x10 3/mm (ref 0.2–0.9)
Neutrophil #: 3.2 10*3/uL (ref 1.4–6.5)
Neutrophil %: 70.5 %
RBC: 2.64 10*6/uL — ABNORMAL LOW (ref 3.80–5.20)
RDW: 15.4 % — ABNORMAL HIGH (ref 11.5–14.5)
WBC: 4.6 10*3/uL (ref 3.6–11.0)

## 2013-06-12 LAB — PROTIME-INR: Prothrombin Time: 16.5 secs — ABNORMAL HIGH (ref 11.5–14.7)

## 2013-06-13 LAB — CBC WITH DIFFERENTIAL/PLATELET
Eosinophil #: 0.2 10*3/uL (ref 0.0–0.7)
Eosinophil %: 4.8 %
HCT: 23.4 % — ABNORMAL LOW (ref 35.0–47.0)
HGB: 8.1 g/dL — ABNORMAL LOW (ref 12.0–16.0)
Lymphocyte #: 0.9 10*3/uL — ABNORMAL LOW (ref 1.0–3.6)
Lymphocyte %: 24.5 %
MCH: 35.4 pg — ABNORMAL HIGH (ref 26.0–34.0)
MCV: 102 fL — ABNORMAL HIGH (ref 80–100)
Monocyte #: 0.2 x10 3/mm (ref 0.2–0.9)
Neutrophil #: 2.4 10*3/uL (ref 1.4–6.5)
Platelet: 70 10*3/uL — ABNORMAL LOW (ref 150–440)
WBC: 3.7 10*3/uL (ref 3.6–11.0)

## 2013-06-13 LAB — BASIC METABOLIC PANEL
Anion Gap: 5 — ABNORMAL LOW (ref 7–16)
BUN: 30 mg/dL — ABNORMAL HIGH (ref 7–18)
Calcium, Total: 8.7 mg/dL (ref 8.5–10.1)
Chloride: 108 mmol/L — ABNORMAL HIGH (ref 98–107)
Creatinine: 1.8 mg/dL — ABNORMAL HIGH (ref 0.60–1.30)
Glucose: 112 mg/dL — ABNORMAL HIGH (ref 65–99)
Osmolality: 286 (ref 275–301)
Potassium: 5 mmol/L (ref 3.5–5.1)
Sodium: 140 mmol/L (ref 136–145)

## 2013-06-13 LAB — URINE CULTURE

## 2013-06-13 LAB — PROTIME-INR
INR: 1.3
Prothrombin Time: 16 secs — ABNORMAL HIGH (ref 11.5–14.7)

## 2013-06-14 LAB — CBC WITH DIFFERENTIAL/PLATELET
Basophil %: 0.9 %
Eosinophil %: 4.8 %
HCT: 23.6 % — ABNORMAL LOW (ref 35.0–47.0)
HGB: 8.2 g/dL — ABNORMAL LOW (ref 12.0–16.0)
Lymphocyte #: 1 10*3/uL (ref 1.0–3.6)
Monocyte %: 6.6 %
Neutrophil #: 2.4 10*3/uL (ref 1.4–6.5)
Neutrophil %: 62.5 %
RBC: 2.34 10*6/uL — ABNORMAL LOW (ref 3.80–5.20)
RDW: 15.2 % — ABNORMAL HIGH (ref 11.5–14.5)
WBC: 3.8 10*3/uL (ref 3.6–11.0)

## 2013-06-14 LAB — BASIC METABOLIC PANEL
BUN: 33 mg/dL — ABNORMAL HIGH (ref 7–18)
Chloride: 107 mmol/L (ref 98–107)
Co2: 27 mmol/L (ref 21–32)
Creatinine: 1.82 mg/dL — ABNORMAL HIGH (ref 0.60–1.30)
EGFR (African American): 31 — ABNORMAL LOW
EGFR (Non-African Amer.): 27 — ABNORMAL LOW
Glucose: 127 mg/dL — ABNORMAL HIGH (ref 65–99)
Osmolality: 285 (ref 275–301)
Potassium: 4.8 mmol/L (ref 3.5–5.1)
Sodium: 138 mmol/L (ref 136–145)

## 2013-06-14 LAB — PROTIME-INR: Prothrombin Time: 16 secs — ABNORMAL HIGH (ref 11.5–14.7)

## 2013-06-15 LAB — BASIC METABOLIC PANEL
BUN: 31 mg/dL — ABNORMAL HIGH (ref 7–18)
Calcium, Total: 8.7 mg/dL (ref 8.5–10.1)
Chloride: 106 mmol/L (ref 98–107)
Co2: 27 mmol/L (ref 21–32)
Creatinine: 1.81 mg/dL — ABNORMAL HIGH (ref 0.60–1.30)
EGFR (African American): 31 — ABNORMAL LOW
Sodium: 139 mmol/L (ref 136–145)

## 2013-06-15 LAB — CBC WITH DIFFERENTIAL/PLATELET
Basophil #: 0 10*3/uL (ref 0.0–0.1)
Basophil %: 1 %
Eosinophil #: 0.2 10*3/uL (ref 0.0–0.7)
Eosinophil %: 4.3 %
Lymphocyte %: 27.3 %
MCH: 35.3 pg — ABNORMAL HIGH (ref 26.0–34.0)
MCHC: 34.6 g/dL (ref 32.0–36.0)
MCV: 102 fL — ABNORMAL HIGH (ref 80–100)
Monocyte #: 0.2 x10 3/mm (ref 0.2–0.9)
Monocyte %: 6.1 %
Neutrophil %: 61.3 %
Platelet: 76 10*3/uL — ABNORMAL LOW (ref 150–440)
WBC: 3.6 10*3/uL (ref 3.6–11.0)

## 2013-06-16 LAB — CBC WITH DIFFERENTIAL/PLATELET
Basophil #: 0 10*3/uL (ref 0.0–0.1)
Eosinophil #: 0.2 10*3/uL (ref 0.0–0.7)
HCT: 24.6 % — ABNORMAL LOW (ref 35.0–47.0)
HGB: 8.4 g/dL — ABNORMAL LOW (ref 12.0–16.0)
Lymphocyte #: 0.9 10*3/uL — ABNORMAL LOW (ref 1.0–3.6)
MCH: 35 pg — ABNORMAL HIGH (ref 26.0–34.0)
MCHC: 34.4 g/dL (ref 32.0–36.0)
MCV: 102 fL — ABNORMAL HIGH (ref 80–100)
Monocyte %: 5.5 %
Neutrophil #: 2.7 10*3/uL (ref 1.4–6.5)
Neutrophil %: 66.7 %
WBC: 4 10*3/uL (ref 3.6–11.0)

## 2013-06-16 LAB — BASIC METABOLIC PANEL
Anion Gap: 4 — ABNORMAL LOW (ref 7–16)
BUN: 30 mg/dL — ABNORMAL HIGH (ref 7–18)
Calcium, Total: 8.9 mg/dL (ref 8.5–10.1)
Co2: 28 mmol/L (ref 21–32)
Creatinine: 1.72 mg/dL — ABNORMAL HIGH (ref 0.60–1.30)
EGFR (African American): 33 — ABNORMAL LOW
EGFR (Non-African Amer.): 29 — ABNORMAL LOW
Glucose: 136 mg/dL — ABNORMAL HIGH (ref 65–99)
Potassium: 4.6 mmol/L (ref 3.5–5.1)
Sodium: 138 mmol/L (ref 136–145)

## 2013-06-16 LAB — PROTIME-INR: Prothrombin Time: 20.3 secs — ABNORMAL HIGH (ref 11.5–14.7)

## 2013-11-23 ENCOUNTER — Encounter: Payer: Self-pay | Admitting: Podiatry

## 2013-11-29 ENCOUNTER — Ambulatory Visit (INDEPENDENT_AMBULATORY_CARE_PROVIDER_SITE_OTHER): Payer: Medicare Other | Admitting: Podiatry

## 2013-11-29 ENCOUNTER — Encounter: Payer: Self-pay | Admitting: Podiatry

## 2013-11-29 VITALS — BP 142/82 | HR 66 | Resp 18 | Wt 181.0 lb

## 2013-11-29 DIAGNOSIS — M79609 Pain in unspecified limb: Secondary | ICD-10-CM

## 2013-11-29 DIAGNOSIS — B351 Tinea unguium: Secondary | ICD-10-CM

## 2013-11-29 NOTE — Progress Notes (Signed)
She presents today chief complaint of painful elongated toenails.  Objective: Pulses are palpable bilateral. Nails are thick yellow dystrophic onychomycotic bilateral. Hammertoe deformities bilateral.  Assessment: Pain in limb secondary to onychomycosis 1 through 5 bilateral.  Plan: Debridement of nails 1 through 5 is a covered service.

## 2014-01-04 DIAGNOSIS — I2510 Atherosclerotic heart disease of native coronary artery without angina pectoris: Secondary | ICD-10-CM | POA: Insufficient documentation

## 2014-01-04 DIAGNOSIS — Z794 Long term (current) use of insulin: Secondary | ICD-10-CM

## 2014-01-04 DIAGNOSIS — E119 Type 2 diabetes mellitus without complications: Secondary | ICD-10-CM | POA: Insufficient documentation

## 2014-01-04 DIAGNOSIS — E785 Hyperlipidemia, unspecified: Secondary | ICD-10-CM | POA: Insufficient documentation

## 2014-01-04 DIAGNOSIS — I4891 Unspecified atrial fibrillation: Secondary | ICD-10-CM | POA: Insufficient documentation

## 2014-01-04 DIAGNOSIS — E039 Hypothyroidism, unspecified: Secondary | ICD-10-CM | POA: Insufficient documentation

## 2014-01-04 DIAGNOSIS — G629 Polyneuropathy, unspecified: Secondary | ICD-10-CM | POA: Insufficient documentation

## 2014-01-04 DIAGNOSIS — I10 Essential (primary) hypertension: Secondary | ICD-10-CM | POA: Insufficient documentation

## 2014-02-07 DIAGNOSIS — R600 Localized edema: Secondary | ICD-10-CM | POA: Insufficient documentation

## 2014-02-07 DIAGNOSIS — I701 Atherosclerosis of renal artery: Secondary | ICD-10-CM | POA: Insufficient documentation

## 2014-02-07 DIAGNOSIS — Z9889 Other specified postprocedural states: Secondary | ICD-10-CM | POA: Insufficient documentation

## 2014-02-07 DIAGNOSIS — I351 Nonrheumatic aortic (valve) insufficiency: Secondary | ICD-10-CM | POA: Insufficient documentation

## 2014-02-07 DIAGNOSIS — Z951 Presence of aortocoronary bypass graft: Secondary | ICD-10-CM | POA: Insufficient documentation

## 2014-03-05 ENCOUNTER — Ambulatory Visit (INDEPENDENT_AMBULATORY_CARE_PROVIDER_SITE_OTHER): Payer: Medicare Other | Admitting: Podiatry

## 2014-03-05 ENCOUNTER — Encounter: Payer: Self-pay | Admitting: Podiatry

## 2014-03-05 VITALS — BP 152/82 | HR 66 | Resp 12

## 2014-03-05 DIAGNOSIS — M79609 Pain in unspecified limb: Secondary | ICD-10-CM

## 2014-03-05 DIAGNOSIS — B351 Tinea unguium: Secondary | ICD-10-CM

## 2014-03-05 DIAGNOSIS — M79676 Pain in unspecified toe(s): Secondary | ICD-10-CM

## 2014-03-05 NOTE — Progress Notes (Signed)
She presents today with a chief complaint of painful elongated toenails one through 5 bilateral.  Objective: Pulses are palpable bilateral. Nails are thick yellow dystrophic with mycotic and painful palpation.  Assessment: Pain in limb secondary to onychomycosis

## 2014-06-06 ENCOUNTER — Ambulatory Visit (INDEPENDENT_AMBULATORY_CARE_PROVIDER_SITE_OTHER): Payer: Medicare Other | Admitting: Podiatry

## 2014-06-06 ENCOUNTER — Encounter: Payer: Self-pay | Admitting: Podiatry

## 2014-06-06 DIAGNOSIS — M79676 Pain in unspecified toe(s): Secondary | ICD-10-CM

## 2014-06-06 DIAGNOSIS — B351 Tinea unguium: Secondary | ICD-10-CM

## 2014-06-06 NOTE — Progress Notes (Signed)
Presents today chief complaint of painful elongated toenails.  Objective: Pulses are palpable bilateral nails are thick, yellow dystrophic onychomycosis and painful palpation.   Assessment: Onychomycosis with pain in limb.  Plan: Treatment of nails in thickness and length as covered service secondary to pain.  

## 2014-06-11 ENCOUNTER — Ambulatory Visit: Payer: Self-pay | Admitting: Physician Assistant

## 2014-09-05 ENCOUNTER — Ambulatory Visit (INDEPENDENT_AMBULATORY_CARE_PROVIDER_SITE_OTHER): Payer: Medicare Other | Admitting: Podiatry

## 2014-09-05 DIAGNOSIS — L97521 Non-pressure chronic ulcer of other part of left foot limited to breakdown of skin: Secondary | ICD-10-CM

## 2014-09-05 DIAGNOSIS — M79676 Pain in unspecified toe(s): Secondary | ICD-10-CM

## 2014-09-05 DIAGNOSIS — B351 Tinea unguium: Secondary | ICD-10-CM

## 2014-09-05 DIAGNOSIS — L89891 Pressure ulcer of other site, stage 1: Secondary | ICD-10-CM

## 2014-09-05 NOTE — Progress Notes (Signed)
Presents today chief complaint of painful elongated toenails. She also has a sore to the posterior lateral aspect of her left heel. She states that this come about from ill fitting shoes. She states that it was red but she's been soaking it and since also warm water and putting alcohol on the lesion. She states that it seems to be doing much better.  Objective: Pulses are palpable bilateral nails are thick, yellow dystrophic onychomycosis and painful palpation. Pulses are palpable left. Superficial ulceration posterior lateral aspect of the left foot with granulation tissue tissue and epithelialization. This appears to be healing though there is some reactive hyperkeratosis around the margin of the granulation tissue.  Assessment: Onychomycosis with pain in limb. Superficial ulceration posterior lateral left heel.  Plan: Treatment of nails in thickness and length as covered service secondary to pain. Debrided all reactive hyperkeratosis continue current therapies for her left heel and rid herself of the shoes to cause the problem.

## 2014-09-25 DIAGNOSIS — I509 Heart failure, unspecified: Secondary | ICD-10-CM | POA: Insufficient documentation

## 2014-12-05 ENCOUNTER — Ambulatory Visit (INDEPENDENT_AMBULATORY_CARE_PROVIDER_SITE_OTHER): Payer: Medicare Other | Admitting: Podiatry

## 2014-12-05 ENCOUNTER — Encounter: Payer: Self-pay | Admitting: Podiatry

## 2014-12-05 DIAGNOSIS — B351 Tinea unguium: Secondary | ICD-10-CM

## 2014-12-05 DIAGNOSIS — L97521 Non-pressure chronic ulcer of other part of left foot limited to breakdown of skin: Secondary | ICD-10-CM

## 2014-12-05 DIAGNOSIS — M79676 Pain in unspecified toe(s): Secondary | ICD-10-CM | POA: Diagnosis not present

## 2014-12-05 DIAGNOSIS — Q828 Other specified congenital malformations of skin: Secondary | ICD-10-CM

## 2014-12-05 DIAGNOSIS — L89891 Pressure ulcer of other site, stage 1: Secondary | ICD-10-CM

## 2014-12-05 NOTE — Progress Notes (Signed)
Haley Hill presents today for follow-up of her painfully elongated toenails. As well as a soft tissue lesion to the lateral aspect of her left heel.  Objective: Vital signs are stable she is alert and oriented 3. Pulses are palpable bilateral. Nails are thick yellow dystrophic with mycotic and painful palpation. She also has a superficial ulceration lateral aspect of the left foot which appears to be healing and I no longer see any skin breakdown. The area still tender to touch.  Assessment: Pain in limb secondary to onychomycosis in a decubitus lateral heel left.  Plan: Debridement of nails 1 through 5 bilateral and dispensed a heel pad. Follow up with her in 3 months

## 2014-12-14 NOTE — H&P (Signed)
PATIENT NAME:  Haley Hill, Haley Hill MR#:  [id number] DATE OF BIRTH:  June 07, 1941  DATE OF ADMISSION:  06/07/2013  HISTORY OF PRESENT ILLNESS: Haley Hill is a 74 year old Haley Hill lady followed by Dr. Hillis RangeVish Hande. She woke up this morning have palpitations and not feeling well. She presented to the Emergency Room where she was found to be in a slow atrial fib with a rate in the low 40s. In addition, she was found to have a BUN of 71 with a creatinine of 5.9. Potassium was 6.7.   The patient was recently in the hospital with congestive heart failure and cellulitis at which time her creatinine was in the 1.5-to-1.7 range. The patient was given Kayexalate in the Emergency Room. She was also started on an insulin and blood sugar drip to get her potassium down.   The patient'Hill past medical history is notable for hypertension, type 2 diabetes, coronary artery disease, chronic atrial flutter, hypothyroidism, vitiligo, diabetic peripheral neuropathy, and a history of right renal artery stenosis with a PTCA in 2002.   PAST SURGICAL HISTORY: Notable only for 2 benign breast biopsies.   FAMILY AND SOCIAL HISTORY: Unchanged since her admission in September.   MEDICATIONS: Include lisinopril 20 mg daily, diltiazem extended release 240 mg daily,  calcium 500 mg daily, gabapentin 800 mg t.i.d., Levoxyl 100 mcg daily, furosemide 40 mg b.i.d., Imdur 30 mg daily, allopurinol 300 mg daily, metformin 1000 mg b.i.d., simvastatin 40 mg at bedtime, metoprolol succinate 150 mg daily, vitamin B12 500 mcg twice a day, warfarin 1.5 mg daily, omeprazole 20 mg daily, Atarax 25 mg t.i.d. p.r.n. itching, Colcrys 0.6 mg daily, and glipizide 10 mg b.i.d.   ALLERGIES:  ALENDRONATE.   REVIEW OF SYSTEMS: Essentially unremarkable with the exception of the present illness as per the ER questionnaire.   ADMISSION PHYSICAL EXAMINATION:  VITAL SIGNS: Blood pressure 102/44. Pulse is 42. Respirations 18, and pulse oximetry is 95% on room air.   GENERAL: An elderly lady who appears her stated age. She is alert and oriented in no acute distress.  SKIN: Normal in color and turgor. She does have extensive vitiligo. There is no lymphadenopathy.  HEENT: Notable for marked arterial narrowing on funduscopic exam.  NECK: Supple. Thyroid is not palpable. There is no JVD. There are no carotid bruits.  LUNGS: Clear to auscultation.  BREASTS: Not examined.  CARDIOVASCULAR: Heart sounds to be distant. There was an irregular rhythm but no murmurs or gallops were appreciated. S1, S2 were normal.  ABDOMEN: Soft and nontender. Liver and spleen are not enlarged. Bowel sounds were normal.  GENITORECTAL: Deferred.  EXTREMITIES: there are stasis changes in the lower extremities. The distal pulses were 1+. There were generalized changes of osteoarthritis.  NEUROLOGIC: Examination was felt to be physiological.   EKG shows atrial flutter with a slow ventricular response.   Comprehensive metabolic panel is notable for a BUN of 71, creatinine of 5.9, a sodium of 134, a potassium of 6.7, chloride 108, and an estimated GFR of 6. Anion gap is 8.   CBC showed a hemoglobin of 10.3 with a hematocrit of 30.2. Indices are macrocytic.   Urinalysis was notable for 30 mg/dL of protein.   IMPRESSION:  1.  Acute renal failure, possibly secondary to medication.  2.  Hyperkalemia secondary to #1.  3.  Marked bradycardia, probably secondary to the hyperkalemia.  4.  Chronic atrial flutter fibrillation.  5.  Hypertension.  6.  Type 2 diabetes.   PLAN: The patient is  being admitted to the intensive care unit where her glucose/insulin drip will be continued. Nephrology has been consulted. A repeat potassium is scheduled for later this evening.   Because of her impaired renal function, the patient'Hill furosemide will be suspended. We will also discontinue the lisinopril as it may be aggravating renal artery stenosis. Because of her bradycardia, her diltiazem and metoprolol  will be discontinued.   Because of her impaired renal function, the metformin will be held. The patient will be placed on a sliding scale of insulin. Will check daily pro times and leave her on Coumadin at the present time.   Cardiology consult has been requested as per her bradycardia but this is most likely due to her electrolyte imbalance.    ____________________________ Letta Pate. Danne Harbor, MD [id number] D: 06/07/2013 20:16:33 ET T: 06/07/2013 20:25:28 ET JOB#: [id number]  cc: Jonny Ruiz B. Danne Harbor, MD, <Dictator> Elmo Putt III MD ELECTRONICALLY SIGNED 06/08/2013 7:48

## 2014-12-14 NOTE — H&P (Signed)
PATIENT NAME:  [name], Felicite S MR#:  [id number] DATE OF BIRTH:  1940/11/20  DATE OF ADMISSION:  12/27/2012  PRIMARY CARE PHYSICIAN: Barbette ReichmannVishwanath Hande, MD  CHIEF COMPLAINT: Acute hyperkalemia and acute renal failure.   HISTORY OF PRESENT ILLNESS: This is a 74 year old female who presents to the hospital sent over from her primary care physician's office due to abnormal labs. The patient says that she did have some diarrhea on Sunday when she was at a restaurant. She denies any abdominal pain, fevers, chills, chest pain, shortness of  breath or any other associated symptoms. She went to see her primary care physician for lab work today and was noted to have a potassium of 7 and a creatinine of 2.5, which is significantly unusual from her baseline labs. She was then urgently sent over to the ER for further evaluation. In the ER, the patient's potassium was noted to be 6.6, and she was noted to be in acute renal failure with a creatinine of 2.4. She was urgently given some insulin D50, Kayexalate and calcium gluconate. Hospitalist services were contacted for further treatment and evaluation.   REVIEW OF SYSTEMS: CONSTITUTIONAL: No documented fever. No weight gain, no weight loss.  EYES: No blurry or double vision.  ENT: No tinnitus. No postnasal drip. No redness of the oropharynx.  RESPIRATORY: No cough, no wheeze, no hemoptysis, no dyspnea.  CARDIOVASCULAR: No chest pain, no orthopnea, no palpitations or syncope.  GASTROINTESTINAL: No nausea, no vomiting, no diarrhea, no abdominal pain, no melena or hematochezia.  GENITOURINARY: No dysuria or hematuria.  ENDOCRINE: No polyuria or nocturia, heat or cold intolerance.  HEMATOLOGIC: No anemia, no bruising, no bleeding.  INTEGUMENTARY: No rashes, no lesions.  MUSCULOSKELETAL: No arthritis. No swelling. No gout.  NEUROLOGIC: No numbness, no tingling. No ataxia. No seizure-type activity.  PSYCHIATRIC: No anxiety. No insomnia. No ADD.   PAST MEDICAL  HISTORY: Consistent with chronic atrial fibrillation, diabetes, hypertension, hyperlipidemia, history of hypothyroidism, history of gout, GERD.  ALLERGIES: FOSAMAX, WHICH CAUSES ESOPHAGITIS.   SOCIAL HISTORY: No smoking. No alcohol abuse. No illicit drug abuse. She lives at home with her son.   FAMILY HISTORY: Mother died from old age. She had some osteoarthritis. She cannot recall what her father died from.   CURRENT MEDICATIONS: Tylenol 650 q. 4 hours as needed, allopurinol 100 mg 2.5 tabs daily, Cardizem CD 240 mg daily, colchicine 0.6 mg daily, Coumadin 2 mg daily, Lasix 40 mg b.i.d., gabapentin 800 mg t.i.d., glipizide 10 mg b.i.d., Synthroid 100 mcg daily, Imdur 30 mg daily, lisinopril 20 mg daily, metformin 1000 mg b.i.d., Toprol 150 mg daily, omeprazole 20 mg daily, potassium 20 mEq b.i.d. and simvastatin 40 mg daily.   PHYSICAL EXAMINATION:  VITAL SIGNS:  On admission, temperature is 97.2, pulse 68, respirations 18, blood pressure 150/80, sats 96% on room air.  GENERAL: She is a pleasant-appearing female in no apparent distress.  HEENT: She is atraumatic, normocephalic. Her extraocular muscles are intact. Her pupils are equal and reactive to light. Her sclerae are anicteric. No conjunctival injection. No pharyngeal erythema.  NECK: Supple. There is no jugular venous distention, no bruits, no lymphadenopathy, no thyromegaly.  HEART: Irregular rate and rhythm. No murmurs, no rubs, no clicks.  LUNGS: Clear to auscultation bilaterally. No rales or rhonchi. No wheezes.  ABDOMEN: Soft, flat, nontender, nondistended, has good bowel sounds. No hepatosplenomegaly appreciated.  EXTREMITIES: No evidence of any cyanosis or clubbing. She does have lower extremity changes consistent with peripheral vascular disease and chronic  venous stasis but no cyanosis, no clubbing, +2 pedal and radial pulses bilaterally.  NEUROLOGICAL: The patient is alert, awake and oriented x3 with no focal motor or sensory  deficits appreciated bilaterally.  SKIN: Moist and warm with no rashes appreciated.  LYMPHATIC: There is no cervical or axillary lymphadenopathy.   LABORATORY DATA: Serum glucose 111, BUN 56, creatinine 2.4, sodium 140, potassium 6.6, chloride 112, bicarbonate 20. The patient's LFTs are within normal limits. Kozikowski cell count 4.2, hemoglobin 10.5, hematocrit 31.7, platelet count of 84. INR is 4.2.  ASSESSMENT AND PLAN: This is a 74 year old female with a history of diabetes, hypertension, hyperlipidemia, history of hypothyroidism, chronic atrial fibrillation, diabetic neuropathy, gastroesophageal reflux disease, gout who presents to the hospital due to acute hyperkalemia and acute renal failure.   1.  Acute hyperkalemia:  The likely cause of this is acute renal failure in patient taking p.o. potassium supplements. Her potassium at the PCP's office was 7 and here at 6.6. The patient does not have any EKG changes consistent with hyperkalemia but is at high risk for developing life-threatening arrhythmia given her severe hyperkalemia. The patient has received some Kayexalate, insulin D50 and calcium gluconate in the ER. I will repeat her potassium now and later on tonight. Hold her potassium supplements.  Hydrate her with IV fluids for her renal failure and follow her clinically.  2.  Acute renal failure: This is likely secondary to dehydration from some diarrhea that she has been having. For now, I will hydrate her with IV fluids, follow BUN and creatinine, urine output, hold her ACE and Lasix for now.  3.  Diabetes:  Given her acute renal failure, hold her glipizide and metformin and place her on sliding scale insulin and a carb-controlled diet. Follow blood sugars. 4.  Thrombocytopenia:  This seems to be chronic for the patient. She is followed by Dr. Orlie Dakin.  He thinks it is possible myelodysplastic syndrome, although the patient has refused a bone marrow in the past. She shows no evidence of acute  bleeding. This can be further followed as an outpatient.  5.  Chronic atrial fibrillation:  The patient is currently rate -controlled. I will continue her Cardizem and Toprol, hold her Coumadin as the INR is supratherapeutic presently.  6.  Gastroesophageal reflux disease:  Continue Prilosec.  7.  Hypothyroidism:   Continue Synthroid.  8.  Hyperlipidemia:  Continue simvastatin.  9.  Acute diarrhea, etiology unclear:  We will check stool for C. diff and comprehensive culture.  The patient has not been on any recent antibiotics.   CODE STATUS:  The patient is a FULL CODE.     TIME SPENT WITH ADMISSION:  55 minutes. Critical care time spent: 55 minutes.  ____________________________ Rolly Pancake. Cherlynn Kaiser, MD vjs:cb D: 12/27/2012 20:37:55 ET T: 12/27/2012 20:52:50 ET JOB#: [id number] cc: Rolly Pancake. Cherlynn Kaiser, MD, <Dictator> Houston Siren MD ELECTRONICALLY SIGNED 12/28/2012 15:28

## 2014-12-14 NOTE — Discharge Summary (Signed)
PATIENT NAME:  Haley Hill, Haley Hill MR#:  [id number] DATE OF BIRTH:  July 10, 1941  DATE OF ADMISSION:  06/12/2013 DATE OF DISCHARGE:  06/22/2013   DIAGNOSES AT TIME OF DISCHARGE: 1.  Acute renal failure.  2.  Hyperkalemia.  3.  Bradycardia.  4.  Atrial fibrillation.  5.  History of type 2 diabetes.  6.  Pulmonary hypertension.  7.  Coronary artery disease.  8.  Pancytopenia.  9.  Peripheral neuropathy.   CHIEF COMPLAINT: Palpitations, weakness.   HISTORY OF PRESENT ILLNESS: Haley Hill is a 74 year old female who presented to the ER after she was noted to be bradycardic with a heart rate in the 40s. The patient was also noted to have an elevated BUN of 71 and creatinine of 5.9. The patient had been recently admitted to the hospital with CHF and cellulitis, and her baseline creatinine was about 1.5. She received Kayexalate in the Emergency Room and also insulin and glucose drip to get her potassium down.   PAST MEDICAL HISTORY: Significant for hypertension, type 2 diabetes, CAD, chronic atrial fibrillation, hypothyroidism, vitiligo, diabetic peripheral neuropathy and history of right renal artery stenosis status post PTCA in 2002.   PHYSICAL EXAMINATION: GENERAL: She was alert and oriented.  SKIN: Normal in color. She had extensive vitiligo.  HEENT: NCAT.  NECK: Supple. No JVD.   HEART: S1, S2.  ABDOMEN: Soft, nontender.  EXTREMITIES: Evidence of stasis dermatitis. Peripheral pulses were felt. Evidence of generalized osteoarthritis.  NEUROLOGIC: Nonfocal.   LABORATORY DATA:  BUN 71, creatinine 5.9, sodium 134, potassium 6.7, chloride 108, GFR 6, anion gap 8. CBC showed a hemoglobin of 10.3, hematocrit of 30.2.   HOSPITAL COURSE: [facility] and cardiology and nephrology consults were done. She was seen in consultation by Dr. Cherylann RatelLateef and also Dr. Thedore MinsSingh. It was felt that her bradycardia was most likely related to medications, and her metoprolol and diltiazem were discontinued.  She also had episodes of hypoglycemia and required dextrose intravenous. Her renal function gradually improved with hydration and at the time of discharge, creatinine was 1.72. She was also seen by physical therapy. After discussion with family, it was felt that she could go home and be followed by home health. The patient was  also seen in consultation by a hematologist for microcytosis anemia. She was started on B12. Her metformin was held because of renal impairment, and she was continued on levothyroxine and advised to follow up with nephrologist, Dr. Wynelle LinkKolluru. She was discharged in stable condition on the following medications.   DISCHARGE MEDICATIONS: Levothyroxine 100 mcg once a day, cyanocobalamin 500 mcg once a day, metoprolol succinate 50 mg once a day, gabapentin 400 mg 2 capsules every 8 hours, clonidine 0.1 mg every 8 hours, simvastatin 40 mg once a day, docusate sodium 1 capsule b.i.d., isosorbide mononitrate 30 mg once a day, glipizide 5 mg once a day, fish oil capsule once a day, Coumadin 2 mg once a day and omeprazole 20 mg once a day.   Home health nurse was also arranged and followup with Dr. Thedore MinsSingh and myself in 1 to 2 weeks' time.   The patient is stable at the time of discharge.   Total time spent in discharge of this pt: 35 minutes  ____________________________ Barbette ReichmannVishwanath Kerston Landeck, MD vh:jm D: 06/22/2013 17:22:52 ET T: 06/22/2013 19:41:17 ET JOB#: [id number]  cc: Barbette ReichmannVishwanath Marchele Decock, MD, <Dictator> Barbette ReichmannVISHWANATH Lashaunda Schild MD ELECTRONICALLY SIGNED 06/30/2013 13:06

## 2014-12-14 NOTE — Discharge Summary (Signed)
PATIENT NAME:  [name], Kamillah S MR#:  [id number] DATE OF BIRTH:  [birth year]  DATE OF ADMISSION:  12/27/2012 DATE OF DISCHARGE:  12/30/2012  DISCHARGE DIAGNOSES:  1.  Acute renal failure secondary to dehydration and diarrhea.  2.  Clostridium difficile colitis.  3.  Hypothyroidism.  4.  Chronic atrial fibrillation.  5.  Coronary artery disease.  6.  Generalized weakness and anemia.   CHIEF COMPLAINT: Elevated potassium, weakness, diarrhea.   HISTORY OF PRESENT ILLNESS: Irma NewnessJean Holladay is a 74 year old female who was sent to the ER after on her labs she was noted to have elevated potassium of 7.0. The patient also had been complaining of diarrhea which started after she had gone to eat in a restaurant. She denies any abdominal pain.  No fevers, no chills, no shortness of breath.  Her creatinine was also elevated at 2.5.  In the ED, potassium was noted to be 6.6 and creatinine was still elevated at 2.4, and she was given D50, Kayexalate, calcium gluconate.   PAST MEDICAL HISTORY: Significant for a-fib, type 2 diabetes, hypertension, hyperlipidemia, hypothyroidism, history of gout, GERD.   PHYSICAL EXAMINATION: VITAL SIGNS: Temperature was 97.2, pulse was 68, respirations 18, blood pressure 150/80, O2 sats 96% on room air.  GENERAL: She was pleasant, not in distress.  HEENT: NCAT.  NECK: Supple.  HEART: Irregular rhythm, no murmurs or rubs.  LUNGS: Clear to auscultation.  ABDOMEN: Soft, nontender.  EXTREMITIES: Negative edema.  NEUROLOGICAL: Nonfocal.   LABORATORY AND RADIOLOGICAL DATA:  Glucose 111, BUN 56, creatinine 2.4, sodium 140, potassium 6.6, chloride 112, bicarbonate 20. LFTs were normal. WBC count 4.2, hemoglobin 10.5, hematocrit 31.7, platelets 84, INR of 4.2.   HOSPITAL COURSE: [facility] and continued on IV fluids.  Her serum creatinine gradually improved and came back to baseline of 1.2. She was also seen by Physical Therapy.  She was started on Flagyl for C.  diff colitis, and her diarrhea gradually improved and she was discharged in stable condition on the following medications.   DISCHARGE MEDICATIONS:  Lisinopril 20 mg once a day, Cardizem CD 240 once a day, gabapentin 800 mg t.i.d., isosorbide mononitrate 30 mg once a day, metformin 1000 mg p.o. b.i.d., allopurinol 100 mg 2.5 tablets once a day, simvastatin 40 mg once a day, glipizide 10 mg b.i.d., omeprazole 20 mg once a day, Coumadin 2 mg once a day, metoprolol succinate 150 mg once a day, Levothroid 100 mcg once a day, metronidazole 500 mg every 8 hours for 2 weeks.   FOLLOWUP:  The patient has been advised to follow up with me, Dr. Marcello FennelHande, in 1 to 2 weeks' time, advised to call back with any questions or concerns.   Total time spent in discharge and co rodination of care: 35 minutes  ____________________________ Barbette ReichmannVishwanath Yale Golla, MD vh:cb D: 01/05/2013 13:12:01 ET T: 01/05/2013 14:12:30 ET JOB#: [id number]  cc: Barbette ReichmannVishwanath Tylar Merendino, MD, <Dictator> Barbette ReichmannVISHWANATH Leroi Haque MD ELECTRONICALLY SIGNED 01/11/2013 18:03

## 2014-12-14 NOTE — Consult Note (Signed)
PATIENT NAME:  Haley Hill, Deltha S MR#:  [id number] DATE OF BIRTH:  [birth year]  DATE OF CONSULTATION:  06/08/2013  REFERRING PHYSICIAN:  John B. Danne HarborWalker III, MD CONSULTING PHYSICIAN:  Lamar BlinksBruce J. Kennette Cuthrell, MD  REASON FOR CONSULTATION: Atrial fibrillation with sick sinus syndrome, coronary artery bypass surgery with bradycardia.   CHIEF COMPLAINT: Feels sick.   HISTORY OF PRESENT ILLNESS: This is a 74 year old female with known coronary artery disease, status post coronary artery bypass graft, with history of possible myocardial infarction in the past. The patient has had no current evidence of myocardial infarction, with a normal troponin. She has had atrial fibrillation with controlled heart rate with Cardizem and metoprolol. Recently, has had significant renal dysfunction and acute renal failure with a creatinine of 5.9 and a potassium of 5.8. At this time, the patient has had significant bradycardia into the 30 beat per minute range, but no evidence of true syncope, although there was dizziness. The patient is relatively stable at this time after discontinuation of metoprolol and diltiazem and improvement of other parameters. The patient's INR is 7.4, with no overt bleeding issues. She does not have any overt congestive heart failure or anginal symptoms at this time.   REVIEW OF SYSTEMS: The remainder of review of systems negative for vision change, ringing in the ears, hearing loss, cough, congestion, heartburn, nausea, vomiting, diarrhea, bloody stools, stomach pain, extremity pain, leg weakness, cramping of the buttocks, known blood clots, headaches, blackouts, dizzy spells, nosebleeds, congestion, trouble swallowing, frequent urination, urination at night, muscle weakness, numbness, anxiety, depression, skin lesions or skin rashes.   PAST MEDICAL HISTORY:  1. Bypass surgery.  2. Atrial fibrillation.  3. Hypertension.  4. Valvular heart disease.   FAMILY HISTORY: No family members with early onset of  cardiovascular disease or hypertension.   SOCIAL HISTORY: Currently denies alcohol or tobacco use.   ALLERGIES: As listed.   MEDICATIONS: As listed.   PHYSICAL EXAMINATION:  VITAL SIGNS: Blood pressure is 122/68 bilaterally, heart rate is 50 upright and reclining and irregular.  GENERAL: She is a well-appearing female in no acute distress.  HEAD, EYES, EARS, NOSE AND THROAT: No icterus, thyromegaly, ulcers, hemorrhage or xanthelasma.  CARDIOVASCULAR: Irregularly irregular with normal S1 and S2, with a 2/6 apical murmur consistent with mitral regurgitation. PMI is diffuse. Carotid upstroke normal without bruit. Jugular venous pressure is normal.  LUNGS: Have a few basilar crackles with normal respirations.  ABDOMEN: Soft, nontender, without hepatosplenomegaly or masses. Abdominal aorta is normal in size without bruit.  EXTREMITIES: 2+ radial, femoral, dorsal pedal pulses, with trace to 1+ lower extremity edema. No cyanosis, clubbing or ulcers.  NEUROLOGICAL: Oriented to time, place and person, with normal mood and affect.   ASSESSMENT: A 74 year old with atrial fibrillation with sick sinus syndrome, likely secondary to renal failure and medications, without evidence of myocardial infarction, angina or congestive heart failure.   RECOMMENDATIONS:  1. Discontinue metoprolol and diltiazem.  2. Further nephrology consult and further treatment of renal failure, hyperkalemia.  3. Discontinue Coumadin due to elevated INR and reinstate after improvements.  4. Echocardiogram for LV systolic dysfunction, valvular heart disease contributing to above.  5. Continue serial ECG and enzymes to assess for possible myocardial infarction.  6. Further treatment options after above.    ____________________________ Lamar BlinksBruce J. Sonu Kruckenberg, MD bjk:lb D: 06/08/2013 09:16:15 ET T: 06/08/2013 09:31:34 ET JOB#: [id number]  cc: Lamar BlinksBruce J. Karelyn Brisby, MD, <Dictator> Lamar BlinksBRUCE J Iyan Flett MD ELECTRONICALLY SIGNED 06/18/2013  8:53

## 2014-12-14 NOTE — H&P (Signed)
PATIENT NAME:  [name], Haley S MR#:  [id number] DATE OF BIRTH:  May 02, 1941  DATE OF ADMISSION:  04/24/2013  PRIMARY CARE PHYSICIAN:  Dr. Barbette ReichmannVishwanath Hande  REQUESTING PHYSICIAN:  Dr. Fanny BienQuale  CHIEF COMPLAINT: Lower extremity swelling, pain, and dehydration.   HISTORY OF PRESENT ILLNESS: The patient is a 74 year old female who fell on Saturday night after bending her neck accidentally, fell on the floor, and started having pain in her left knee and generalized right lower extremity pain. She also started noticing some redness and open sores in the bilateral lower extremities after leg shaving, which was done the day before yesterday. She came down to the Emergency Department. While in the ED she was found to have acute renal failure with creatinine of 2, and she is being admitted for further evaluation and management.   PAST MEDICAL HISTORY:  1.  Chronic atrial fibrillation.  2.  Diabetes.  3.  Hypertension.  4.  Hyperlipidemia.  5.  Hypothyroidism.  6.  GERD.   ALLERGIES: FOSAMAX.   SOCIAL HISTORY: No smoking, alcohol use, or drug use. She lives at home with her son.   FAMILY HISTORY: Mother died from old age. She also had osteoarthritis.   REVIEW OF SYSTEMS:  CONSTITUTIONAL: No fever. Positive fatigue and weakness. She reports 30-35-pound weight loss since April 2014, unintentional. She reports having some diarrhea since April, which is slowly improving.  EYES: No blurred or double vision.  ENT: No tinnitus or ear pain.  RESPIRATORY: No cough, wheezing, hemoptysis.  CARDIOVASCULAR: No chest pain, orthopnea, edema.  GASTROINTESTINAL: No nausea or vomiting. Did have some diarrhea, which is improving. No abdominal pain. Her appetite is improving.  GENITOURINARY: No dysuria or hematuria.  ENDOCRINE: No polyuria or nocturia.  HEMATOLOGY: No anemia or easy bruising.  SKIN: She has lower extremity depigmentation below mid shin and also some open lesions from recent shaving with minimal redness  around there.  MUSCULOSKELETAL: No arthritis or muscle cramp.  NEUROLOGICAL: No tingling, numbness, weakness.  PSYCHIATRIC: No history of anxiety or depression.   PHYSICAL EXAMINATION:  VITAL SIGNS: Temperature 98.7, heart rate 74 per minute, respirations 20 per minute, blood pressure 129/66. she is saturating 99% on room air. GENERAL: The patient is a 74 year old female lying in the bed comfortably without any acute distress.  EYES: Pupils equal, round, reactive to light and accommodation. No scleral icterus. Extraocular muscles intact.  HENT: Head atraumatic, normocephalic. Oropharynx and nasopharynx clear.  NECK: Supple, No JVD or tenderness.  LUNGS: Clear to auscultation bilaterally. No wheezing, rales, rhonchi, or crepitation.  CARDIOVASCULAR: Irregularly irregular heart sounds. No murmurs, rubs, or gallops.  ABDOMEN: Soft, nontender, nondistended. Bowel sounds present. No organomegaly or masses.  EXTREMITIES: Show 1+ pedal edema. No cyanosis or clubbing. She has lower extremity chronic venostasis changes with depigmentation midshin below. Lower extremity changes consistent with peripheral vascular disease. She has multiple superficial abrasions and scratching on both lower extremities, seems secondary to recent shaving. She does have some erythema around her right lower extremity, which is somewhat warm also, and has minimal papular lesions.  NEUROLOGICAL: Cranial nerves II through XII intact. Muscle strength 5/5 in all extremities. Sensation intact.  PSYCHIATRIC: The patient is alert and oriented x3.  SKIN: Lesions in both lower extremities mid shin below with depigmented lesions and some erythema and warmth on the right lower extremity.   MEDICATIONS AT HOME:  1.  Simvastatin 40 mg p.o. daily.  2.  Allopurinol 250 mg p.o. daily.  3.  Calcium with  vitamin D one tablet p.o. daily. 4.  Cardizem CD 240 mg p.o. daily.  5.  Coumadin 2 mg p.o. daily.  6.  Iron sulfate 325 mg p.o. daily. 7.   Fish Oil 1000 mg p.o. daily.  8.  Lasix 40 mg p.o. daily. 9.  Gabapentin 800 mg p.o. three times a day.  10.  Glipizide 10 mg p.o. b.i.d.  11.  Isosorbide mononitrate 30 mg p.o. daily.  12.  Levothyroxine 100 mcg p.o. daily.  13.  Lisinopril 20 mg daily. 14.  Lyrica 75 mg p.o. daily.  15.  Metformin 1,000 mg p.o. b.i.d.  16.  Metoprolol ER 150 mg p.o. daily.  17.  Omeprazole 20 mg p.o. daily.    LABORATORY PANEL:  1.  Normal CBC except hemoglobin of 10.2, hematocrit of 30.5, platelets 108.  2.  PT of 23.5, INR 2.1.   3.  UA showed 2+ bacteria, 253 WBCs, 3+ leukocyte esterase.  4.  Normal liver function tests, normal CK.  5.  BMP within normal limits, except BUN of 26, creatinine 2. Repeat creatinine is 1.77 after 1 liter of fluid.  6.  Left knee x-ray showed no acute bony abnormality.  7.  CT scan of the head without contrast showed atrophy with chronic microvascular ischemic changes. No acute intracranial abnormality.   IMPRESSION AND PLAN:  1.  Acute renal failure, likely prerenal. We will hydrate her with IV fluids, hold nephrotoxins including Lasix and ACE inhibitor, monitor her renal function.  2.  Cellulitis, mainly in the right lower extremity, might have some in the left lower extremity. We will start her on IV vancomycin for the time being and have pharmacy dose it.  3.  Chronic atrial fibrillation, on Coumadin. Continue Coumadin with daily coagulations. Continue metoprolol and Cardizem.  4.  Diabetes. We will continue glipizide, hold metformin considering her renal failure, start her on sliding scale insulin.   CODE STATUS: FULL CODE.   Total time taking care of this patient is 55 minutes.    ____________________________ Haley Sia. Sherryll Burger, MD vss:ms D: 04/24/2013 20:25:44 ET T: 04/24/2013 20:48:06 ET JOB#: [id number]  cc: Mehreen Azizi S. Sherryll Burger, MD, <Dictator> Barbette Reichmann, MD Haley Sia [facility] MD ELECTRONICALLY SIGNED 04/27/2013 12:18

## 2014-12-14 NOTE — H&P (Signed)
PATIENT NAME:  [name], Mechell S MR#:  [id number] DATE OF BIRTH:  1940/12/18  DATE OF ADMISSION:  11/22/2012  PRIMARY CARE PHYSICIAN: Dr. Barbette ReichmannVishwanath Hande   CHIEF COMPLAINT: Altered mental status and syncope and also hypoxia.   HISTORY OF PRESENT ILLNESS: This is a 74 year old female who presented to the hospital from home by EMS as she was found down for unknown period of time in her bathroom. Her son says that he left this morning she was in her usual state of health. The patient woke up, went to the bathroom and apparently had a syncopal episode there. She was pretty much there most of the time until the son found her later this afternoon. He called EMS and EMS arrived. Her blood sugar blood sugar at that point was noted to be 39. She received an amp of D50 and her sugars came up. She was brought to the ER. The patient in the Emergency Room has had no further episodes of hypoglycemia. Her CT head has been negative for any acute abnormalities. She was going to be discharged to home, although upon ambulation, she was noted to be hypoxic with room air oxygen saturation in the mid 80s. She does have history of chronic atrial fibrillation is on Coumadin and has a subtherapeutic INR. There is some concern for a possible pulmonary embolism, although we cannot do a CT chest as she is renal failure. Hospitalist services were contacted for further treatment and evaluation. The patient does say that she has been having some exertional dyspnea ongoing for the past few days to a few weeks. Denies any fevers, chills, cough. Denies any chest pain, denies any nausea, vomiting, abdominal pain or any other associated symptoms.   REVIEW OF SYSTEMS: CONSTITUTIONAL: No documented fever. No weight gain or weight loss.  EYES: No blurred or double vision.  ENT: No tinnitus, no postnasal drip, no redness of the oropharynx.  RESPIRATORY: No cough, no wheeze. No hemoptysis. Positive dyspnea on exertion.  CARDIOVASCULAR: No chest  pain. No orthopnea. No palpitations. Positive syncope.  GASTROINTESTINAL: No nausea. No vomiting, no diarrhea. No abdominal pain, no melena or hematochezia.  GENITOURINARY: No dysuria or hematuria.  ENDOCRINE: No polyuria or nocturia. No heat or heat or cold intolerance.  HEMATOLOGIC: No anemia, no bruising, no bleeding.   INTEGUMENTARY: No rashes. No lesions.  MUSCULOSKELETAL: No arthritis, no swelling, no gout.  NEUROLOGIC: No numbness or tingling. No ataxia. No seizure-type activity.  PSYCHIATRIC: No anxiety, no insomnia, no ADD.   PAST MEDICAL HISTORY: Consistent with diabetes, hypertension, hyperlipidemia, chronic atrial fibrillation, diabetic neuropathy, gout, history of recent urinary tract infection gastroesophageal reflux disease.   ALLERGIES: FOSAMAX.   SOCIAL HISTORY: No smoking. No alcohol abuse. No illicit drug abuse. Lives at home with her son.   FAMILY HISTORY: Mother died from congestive heart failure. She cannot recall what her father died from.   CURRENT MEDICATIONS: Are as follows:   Allopurinol 250 mg daily, Bactrim double strength 1 tab b.i.d., Cardizem 240 mg daily, Coumadin 2 mg daily, gabapentin 800 mg t.i.d., glipizide 10 mg b.i.d., Imdur 30 mg daily, lisinopril 20 mg daily, Lyrica 75 mg b.i.d., metformin 1000 mg 1 tab b.i.d. Toprol 200 mg daily, omeprazole 20 mg daily and simvastatin 40 mg daily.   PHYSICAL EXAMINATION:  On admission is as follows: VITAL SIGNS:  Temperature 97.9, pulse 78, respirations 18, blood pressure 170/69, sats 94% on room air.  GENERAL: The patient is a pleasant appearing female in no apparent distress.  HEENT: Atraumatic, normocephalic. Extraocular muscles are intact. Pupils equal, reactive to light. Sclerae anicteric. No conjunctival injection. No oropharyngeal erythema.  NECK: Supple. No jugular venous distention, no bruits, no lymphadenopathy, no thyromegaly.  HEART: Irregular. No murmurs, no rubs, no clicks.  LUNGS: Clear to  auscultation bilaterally. No rales or rhonchi. No wheezes.  ABDOMEN: Soft, flat, nontender, nondistended. Has good bowel sounds. No hepatosplenomegaly appreciated.  EXTREMITIES: No evidence of any cyanosis or clubbing. She does have +2 pitting edema from the knees to the ankles bilaterally. She also has changes of her lower extremities consistent with chronic venous stasis. +2 pedal and radial pulses bilaterally.  NEUROLOGIC: The patient is alert, awake, and oriented x 3 with no focal motor or sensory deficits appreciated bilaterally.  SKIN: Moist and warm with no rash appreciated.   LYMPHATIC: There is no cervical or axillary lymphadenopathy.   LABORATORY, DIAGNOSTIC AND RADIOLOGIC DATA: Showed a serum glucose 115, BUN 23, creatinine 1.9, sodium 138, potassium 3.6, chloride 99, bicarbonate 34, magnesium is 1.4. LFTs are within normal limits. Troponin less than 0.02. Petrey cell count 5.6, hemoglobin 11.7, hematocrit 33.9, platelet count 101. INR is 1.6. The patient's urinalysis is within normal limits.   The patient also had a CT head which showed no evidence of an acute intracranial process.   ASSESSMENT: History of chronic atrial fibrillation, diabetes, hypertension, diabetic neuropathy, gastroesophageal reflux disease, hyperlipidemia, history of coronary artery disease status post CABG, gout, recent urinary tract infection presented to the hospital for fall/syncopal episode and found to be in her bathroom. Apparently had been there for the past few hours. She was also found by EMS to be hypoglycemic, which has now resolved, although when she was ambulated ER, she was noted to be hypoxemic.  1.   Acute hypoxic respiratory failure. The exact etiology of this is unclear. The patient says that she has been having some exertional dyspnea on off for the past few weeks. She has no history of chronic obstructive pulmonary disease. she does have a history of congestive heart failure although her lungs are  clear. She does have a subtherapeutic INR and therefore suspicious for having possible pulmonary embolism, but cannot do CT chest as she is in acute renal failure. Therefore, I will hydrate her with IV fluids, repeat her BUN and creatinine in morning, and get a CT chest in the morning. Continue Coumadin for now. The patient got one dose of Lovenox in the ER.  2.   Hypoglycemia. Likely the cause of the patient's syncope and altered mental status. CT head is negative. Her mental status is now back to baseline. She has had no further episodes of hypoglycemia. Hold metformin and glipizide for now, given her renal failure. Follow blood sugars closely.  3.   Diabetic neuropathy. Continue Neurontin and Lyrica  4.   Gastroesophageal reflux disease. Continue Prilosec.  5.   Hyperlipidemia. Continue simvastatin.  6.  Hypertension. Continue Cardizem, continue Imdur, hold an ACE inhibitor given her acute renal failure.  7.  History of chronic atrial fibrillation, currently rate controlled. Continue Cardizem, continue Coumadin. INR is subtherapeutic. She did receive one dose of Lovenox here in the Emergency Room.   8.  Acute renal failure. This is likely secondary to dehydration and likely acute tubular necrosis from being found down for a long period of time. Give her gentle intravenous fluids, follow BUN and creatinine, urine output. Renal dose medications and avoid nephrotoxins. Hold her angiotensin-converting enzyme inhibitor and metformin for now.  9.  Recent urinary tract infection. This was diagnosed at the [facility]. Urine culture grew out Escherichia coli. She was started on Bactrim, but Bactrim can raise creatinine; therefore, I will suspend that for now. Put her on IV ceftriaxone for now. She can be discharged on oral. Ceftin probably.   CODE STATUS: The patient is a full code.   TIME SPENT WITH THE ADMISSION: 50 minutes.    ____________________________ Rolly Pancake. Cherlynn Kaiser,  MD vjs:cc D: 11/22/2012 21:49:56 ET T: 11/22/2012 22:44:53 ET JOB#: [id number]  cc: Rolly Pancake. Cherlynn Kaiser, MD, <Dictator> Houston Siren MD ELECTRONICALLY SIGNED 11/23/2012 21:19

## 2014-12-14 NOTE — Consult Note (Signed)
Brief Consult Note: Diagnosis: ANEMIA THROMBOCYTOPENIA  ARF CRF FEVER COAGULOPATHY BENCE JONES PROTEINS SPLENOMEGALY.   Patient was seen by consultant.   Consult note dictated.   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  MILD SPLENOMEGALY, NO ADENOPATHY ON CT ABDO AND PELVIS  MAY BE INFECTION, OR PASSIVE CONGESTION LIVER, LESS LIKELY HEMATOLOGIC DISEASE, COAGULOPATHY DUE TO COUMADIN......PLAN CHECK CBC, LDH, URIC ACID B12 LEVEL, PROTIME .  IF PROTIME NOT IMPROVING WOULD GIVE 2.5 MG VIT K PO, SERIAL CBC. IF PLTS DO NOT RECONSTITUTE THEN LATER CHECK HIV, HEP C. LATER WOULD F/U SPLEEN SIZE, LATER , WOULD REPEAT UIEP AND SERUM FREE LIGHT CHAINS. WATCH TEMPS.  Electronic Signatures: Marin RobertsGittin, Shivam Mestas G (MD)  (Signed 17-Oct-14 20:56)  Authored: Brief Consult Note   Last Updated: 17-Oct-14 20:56 by Marin RobertsGittin, Madiline Saffran G (MD)

## 2014-12-14 NOTE — Consult Note (Signed)
PATIENT NAME:  Haley Hill, Haley Hill MR#:  [id number] DATE OF BIRTH:  12-Oct-1940  DATE OF CONSULTATION:  06/09/2013  REFERRING PHYSICIAN:   CONSULTING PHYSICIAN:  Knute Neu. Gittin, MD  HISTORY OF PRESENT ILLNESS:  Ms. Tadros is a 74 year old patient who was admitted on October 15th with palpitations, slow atrial fib and acute renal failure, creatinine of 5.9 and hyperkalemia.  She was treated for hyperkalemia, she was admitted.  She has had medications adjusted including holding Lasix, holding metformin, judicious fluids, renal consultation.  She was noted to have elevated Pro Time and her chronic warfarin was held.  Cardiology has seen and she had an echocardiogram, revealed severely dilated right and left atrium and valvular regurgitant disease.  Renal ultrasound was done, showed a possible small 1 cm lesion.  The CT scan did not see any pathology.  It was felt that the patient could have an angiomyolipoma.  The patient is being followed by medicine, nephrology.  Early this morning she had a fever spike.  Blood cultures were sent.  Urine has not yet been collected.  Has not had recurrent fever, currently temperature is 98.9.  The patient was found to have a normal SIP and a very low level of monoclonal urine protein.   PAST MEDICAL HISTORY: 1.  Hypertension.  2.  Diabetes.  3.  Coronary artery disease.  4.  Atrial flutter.  5.  Hypothyroidism.  6.  Vitiligo.  7.  Diabetes.  8.  Diabetic peripheral neuropathy. 9.  Renal artery stenosis.  10.  Two benign breast biopsies.   SOCIAL HISTORY:  Not a smoker.  No alcohol.   MEDICATIONS AT THE TIME OF ADMISSION:  Lisinopril 20 mg daily, diltiazem 240 mg daily, calcium 500 mg daily, gabapentin 800 three times daily, Levoxyl 100 mg daily, Lasix 40 twice daily, Imdur 30 daily, allopurinol 300 daily, metformin 1000 twice daily, simvastatin 40 at night, metoprolol 150 daily.  Vitamin B orally, warfarin 1.5 mg daily, Prilosec 20 mg daily, Atarax 25 three times daily,  colchicine 0.6 daily, glipizide 10 twice daily.   ALLERGIES:  ALENDRONATE.   REVIEW OF SYSTEMS:  When I saw the patient, she had no acute complaints.  She had stated she had some lower abdominal pain earlier.  Otherwise, she denied headache, no dizziness.  No visual disturbances.  No chills, no sweats, no cough.  No ear or jaw pain, no sinus or nasal congestion.  No retrosternal chest pain and no wheezing.  No hemoptysis.  No mouth or gum bleeding.  No epistaxis.  No vomiting, no nausea, no diarrhea.  Was not aware of increased bruising and not seeing blood in the stools.  No edema.  Denied bone pain.  Denied chills or sweats.  No rash.   PHYSICAL EXAMINATION: GENERAL:  Alert and cooperative.  Vitiligo on the face.  She has pallor.  There is no thrush.  LYMPHATIC:  No palpable lymph nodes in the neck, supraclavicular, submandibular, axilla.  LUNGS:  Clear.   HEART:  Regular.  ABDOMEN:  Nontender.  No palpable mass or organomegaly.  Distended and tympanitic.  EXTREMITIES:  Stasis or diabetic pigment changes in the lower extremities, 1+ symmetric edema.  NEUROLOGIC:  Grossly nonfocal.  Alert and cooperative.  Has some memory lapses.   ADMISSION LABORATORY DATA:  Creatinine was 5.9, it had come down to 3.8, admission potassium of 6.7, it had come down to 5.5, admission hemoglobin was 10.3, it had come down to 8.2, admission platelets were 109, have come  down to 66.  Urinalysis had a low level of protein, a very low Bence-Jones protein was positive.  The SIEP was normal.  ANA was negative.  Serum iron studies were normal.  Ferritin was 67.  An INR was 7.8.  CT of the abdomen and pelvis showed borderline splenomegaly at 13 cm.  No other pathology.  No adenopathy.  Liver chemistries on admission were normal.  Vitamin D level was low.  The electrophoresis >> was normal, was SPEP, not in immunoelectrophoresis, but there was no M spike.  A chest x-ray on admission showed no significant disease.   IMPRESSION  AND PLAN:  From the hematology point of view, the patient has a high Pro Time attributed to Coumadin.    She has not been on Coumadin since admission.  There has been no bleeding.  If the Pro Time remains significantly elevated without any improvement, I would recommend low-dose oral vitamin K, with severely dilated atrium and the echo results would defer to medicine or cardiology if they felt it was contraindicated to possibly reversing anticoagulation.  I would hold allopurinol later, might want to give a lower dose with azotemia.  Serial CBCs, serial creatinines.  Got to anemia is multifactorial.  Acute and chronic illness, possibly acute infection, azotemia would certainly cause anemia, some hydration effects.  There is no sign of bleeding or guaiac positive stools, but with high Pro Time and hemoglobin falling, would want to perform stool guaiacs.  Would check the B12 levels.  Electrophoresis has already been noted.  With thrombocytopenia multifactorial, could be infection, hypersplenism, possible idiopathic thrombocytopenia purpura, doubt thrombotic thrombocytopenic purpura with the kidney functions improving, but we will check a smear.  We will check an LDH and we will repeat the hemoglobin and platelet count.  If the platelet count does not normalize after acute illness would want to then look at HIV and hepatitis C serologies.  For the slightly enlarged spleen, would follow up in the future, consider liver disease, consider passive congestion of the liver from right heart disease, would consider, but currently unlikely be a lymphoma or significant hematologic disease, the patient could have an early myelodysplasia.   PLAN:  I have repeated the CBC and the Pro Time.  Check a uric acid, check an LDH, check the peripheral smear.  Defer to medicine about vitamin K given the fibrillation and dilated atria and stroke risk if the patient is not anticoagulated.  I will review the recent hospitalization, from the  record labs in April and May the platelets were low in the 80,000 range, hemoglobin was 9 to 10.5, although the patient had infections, most recently from prior hospitalization.  There is a low B12 level of 194 and platelets had dropped again as low as the 60s, would review the work-up from that visit.      ____________________________ Knute Neuobert G. Lorre NickGittin, MD rgg:ea D: 06/09/2013 21:07:45 ET T: 06/10/2013 00:33:37 ET JOB#: [id number]  cc: Knute Neuobert G. Lorre NickGittin, MD, <Dictator> Marin RobertsOBERT G GITTIN MD ELECTRONICALLY SIGNED 07/27/2013 17:07

## 2014-12-14 NOTE — Discharge Summary (Signed)
PATIENT NAME:  Haley Hill, Haley Hill MR#:  [id number] DATE OF BIRTH:  08-03-1941  DATE OF ADMISSION:  06/07/2013 DATE OF DISCHARGE:  06/16/2013   DIAGNOSES AT TIME OF DISCHARGE: 1.  Acute renal failure.  2.  Hyperkalemia.  3.  Bradycardia.  4.  Atrial fibrillation.  5.  History of type 2 diabetes.  6.  Pulmonary hypertension.  7.  Coronary artery disease.  8.  Pancytopenia.  9.  Peripheral neuropathy.   CHIEF COMPLAINT: Palpitations, weakness.   HISTORY OF PRESENT ILLNESS: Haley Hill is a 74 year old female who presented to the ER after she was noted to be bradycardic with a heart rate in the 40s. The patient was also noted to have an elevated BUN of 71 and creatinine of 5.9. The patient had been recently admitted to the hospital with CHF and cellulitis, and her baseline creatinine was about 1.5. She received Kayexalate in the Emergency Room and also insulin and glucose drip to get her potassium down.   PAST MEDICAL HISTORY: Significant for hypertension, type 2 diabetes, CAD, chronic atrial fibrillation, hypothyroidism, vitiligo, diabetic peripheral neuropathy and history of right renal artery stenosis status post PTCA in 2002.   PHYSICAL EXAMINATION: GENERAL: She was alert and oriented.  SKIN: Normal in color. She had extensive vitiligo.  HEENT: NCAT.  NECK: Supple. No JVD.   HEART: S1, S2.  ABDOMEN: Soft, nontender.  EXTREMITIES: Evidence of stasis dermatitis. Peripheral pulses were felt. Evidence of generalized osteoarthritis.  NEUROLOGIC: Nonfocal.   LABORATORY DATA:  BUN 71, creatinine 5.9, sodium 134, potassium 6.7, chloride 108, GFR 6, anion gap 8. CBC showed a hemoglobin of 10.3, hematocrit of 30.2.   HOSPITAL COURSE: The [facility] and cardiology and nephrology consults were done. She was seen in consultation by Dr. Cherylann RatelLateef and also Dr. Thedore MinsSingh. It was felt that her bradycardia was most likely related to medications, and her metoprolol and diltiazem were discontinued.  She also had episodes of hypoglycemia and required dextrose intravenous. Her renal function gradually improved with hydration and at the time of discharge, creatinine was 1.72. She was also seen by physical therapy. After discussion with family, it was felt that she could go home and be followed by home health. The patient was  also seen in consultation by a hematologist for microcytosis anemia. She was started on B12. Her metformin was held because of renal impairment, and she was continued on levothyroxine and advised to follow up with nephrologist, Dr. Wynelle LinkKolluru. She was discharged in stable condition on the following medications.   DISCHARGE MEDICATIONS: Levothyroxine 100 mcg once a day, cyanocobalamin 500 mcg once a day, metoprolol succinate 50 mg once a day, gabapentin 400 mg 2 capsules every 8 hours, clonidine 0.1 mg every 8 hours, simvastatin 40 mg once a day, docusate sodium 1 capsule b.i.d., isosorbide mononitrate 30 mg once a day, glipizide 5 mg once a day, fish oil capsule once a day, Coumadin 2 mg once a day and omeprazole 20 mg once a day.   Home health nurse was also arranged and followup with Dr. Thedore MinsSingh and myself in 1 to 2 weeks' time.   The patient is stable at the time of discharge.   Total time spent in discharge of this pt: 35 minutes  ____________________________ Barbette ReichmannVishwanath Lillieann Pavlich, MD vh:jm D: 06/22/2013 17:22:52 ET T: 06/22/2013 19:41:17 ET JOB#: [id number]  cc: Barbette ReichmannVishwanath Marylynne Keelin, MD, <Dictator> Barbette ReichmannVISHWANATH Aberdeen Hafen MD ELECTRONICALLY SIGNED 06/30/2013 13:06

## 2014-12-14 NOTE — Discharge Summary (Signed)
DATE OF BIRTH:  12-30-1940  DATE OF ADMISSION:  11/22/2012  DATE OF DISCHARGE:  11/27/2012  DISCHARGE DIAGNOSES: 1.  Syncope.  2.  Right humeral fracture.  3.  Hypoxia that is resolved.  4.  Chronic atrial fibrillation, on Coumadin.  5.  Diabetes with neuropathy.  6.  Gastroesophageal reflux disease.   7.  Hyperlipidemia.  8.  Hypertension.   DISCHARGE MEDICATIONS: 1.  Lisinopril 20 mg p.o. daily.  2.  Cardizem 250 mg p.o. daily.  3.  Lyrica 75 mg p.o. b.i.d.  4.  Metoprolol succinate 200 mg p.o. daily.  5.  Gabapentin 800 mg 1 tab p.o. t.i.d.  6.  Imdur 30 mg p.o. daily. 7.  Metformin 1000 mg p.o. b.i.d.  8.  Allopurinol 100 mg 2-1/2 tabs p.o. daily.  9.  Simvastatin 40 mg p.o. daily.  10.  Glipizide 10 mg p.o. b.i.d.  11.  Omeprazole 20 mg p.o. daily.  12.  Acetaminophen 2 tabs p.o. q. 4 hours as needed for pain.  13.  Coumadin 3 mg p.o. daily.  14.  Furosemide 40 mg p.o. b.i.d.  15.  Potassium chloride 20 mEq p.o. twice a day.   CONSULTS:  Orthopedics per Dr. Ernest PineHooten.   PROCEDURES:  None.   PERTINENT LABS PRIOR TO DISCHARGE:  Sodium 139, potassium 4.8, creatinine 1.36, glucose 120. Hemoglobin 9.8. INR 1.5.  Did get an echocardiogram that showed an EF of 50% to 55%, moderate to severe tricuspid regurgitation, moderate mitral regurgitation. CT of the head was negative. V/Q scan showed low probability for PE. Chest x-ray was negative for any infiltrate, did show some edema.   BRIEF HOSPITAL COURSE:  1. Syncope. Patient initially came in with syncopal episode, most likely due to hypoglycemia. Other work up was negative. CT of the head was negative. Echo did not show any acute abnormalities that would contribute to that. Electrolytes were stable, and no signs of infection could be seen. With this episode, she did suffer a right humeral fracture that was mildly displaced. She was seen by Dr. Ernest PineHooten, who did not recommend surgery at this time, but did place her in a sling. Will  need followup with him. She is pain controlled while she is in the sling.   2.  Hypoxia. Patient was initially admitted because her O2 sats were in the 80% range on room air. She was given oxygen, her O2 sat did come up. She had a chest x-ray that was negative for any acute infiltrate. It was thought that this was most likely some degree of fluid. She had an echocardiogram that did not show any congestive heart failure, but did show some regurgitation of the tricuspid and mitral valve. She was placed on Lasix, and responded appropriately. On day of discharge, she was 94% on room air.   3.  Chronic atrial fibrillation, on Coumadin. The patient remained subtherapeutic, INR of 1.5. This can be followed up as outpatient with Dr. Marcello FennelHande, please note that I did increase her Coumadin level to 3 mg daily.   Other chronic medical issues remain stable at this point. She is going to require 24-hour assistance, which is provided by her family members. She will also get physical therapy at home.   DISPOSITION:  She is in stable condition to be discharged to home. Follow up with Dr. Marcello FennelHande within 10 days. Will need to follow up with Dr. Ernest PineHooten as an outpatient as well.   Please note that it took longer than 30 minutes to do  her discharge.    ____________________________ Marisue Ivan, MD kl:mr D: 11/27/2012 10:50:00 ET T: 11/27/2012 19:35:28 ET JOB#: [id number]  cc: Marisue Ivan, MD, <Dictator> Marisue Ivan MD ELECTRONICALLY SIGNED 12/02/2012 10:01

## 2014-12-14 NOTE — Discharge Summary (Signed)
PATIENT NAME:  Haley Hill, Haley Hill MR#:  [id number] DATE OF BIRTH:  05/06/1941  DATE OF ADMISSION:  04/24/2013 DATE OF DISCHARGE:  04/28/2013   DISCHARGE DIAGNOSES: 1.  Cellulitis of both lower extremities.  2.  Chronic atrial fibrillation.  3.  Acute on chronic renal failure.  4.  Type 2 diabetes.  5.  History of atrial fibrillation.  6.  History of hypothyroidism.  7.  History of congestive heart failure.   CHIEF COMPLAINT: Lower extremity swelling. Pain associated with itching of both lower extremities.   HISTORY OF PRESENT ILLNESS: Haley NewnessJean Hill is a 74 year old female who was admitted complaining of generalized right lower extremity pain and also pain in the left knee. The patient also noted some redness and open sores on both extremities, especially after she shaved her legs. The patient was noted to have acute renal failure with serum creatinine of 2 on admission.   PAST MEDICAL HISTORY: Significant for chronic atrial fibrillation, type 2 diabetes, hypertension, hyperlipidemia, hypothyroidism and GERD.   PHYSICAL EXAMINATION: VITAL SIGNS: Temperature was 98.7, heart rate 74, respirations 20, blood pressure 129/66, O2 sat 99% on room air.  GENERAL: She was not in distress. Pupils equal and reactive to light.  HEENT: Normocephalic, atraumatic.  NECK: Supple. No JVD.  LUNGS: Clear to auscultation.  HEART: Irregular rhythm. No rubs or murmurs.  ABDOMEN: Soft, nontender.  EXTREMITIES: 1+ pedal edema with significant venous stasis dermatitis and areas of superficial abrasion, scratch marks, and also hyperpigmentation and cellulitis.  NEUROLOGIC: Nonfocal.   HOSPITAL COURSE: [facility], initially received IV fluids. Subsequently became short of breath and IV fluids were discontinued and she was given Lasix. Her renal function did improve to some extent and creatinine came back down to 1.29 but subsequently went up to 1.49 before settling at 1.2 on day of discharge. She was  placed on IV vancomycin. This was subsequently switched to p.o. Levaquin at the time of discharge. A CT of the head showed evidence of atrophy with chronic microvascular ischemic changes. No intracranial abnormality was noted. Chest x-ray showed mild cardiomegaly with mild interstitial edema and some right lung base infiltrate versus atelectasis. The patient was discharged in stable condition on the following medications.   DISCHARGE MEDICATIONS: Cardizem CD 240 mg once a day, gabapentin 800 mg p.o. t.i.d., isosorbide mononitrate 30 mg once a day, allopurinol 100 mg 2-1/2 tablets once a day, simvastatin 40 mg once a day, omeprazole delayed-release 20 mg 1 capsule once a day, Coumadin 2 mg once a day, levothyroxine 100 mcg once a day, furosemide 40 mg once a day, fish oil capsule once a day, ferrous sulfate 325 mg once a day, docusate sodium 1 capsule b.i.d. as needed for constipation, glipizide 5 mg once a day, ammonium lactate topically to be applied to both lower extremities b.i.d. for 7 to 10 days, Levaquin 250 mg p.o. daily for 7 days.   FOLLOWUP: The patient has been advised to have physical therapy and she will be transferred in stable condition to rehab and follow up with me, Dr. Marcello FennelHande, in 1 to 2 weeks' time.   Total time spent in discharge of this patient : 35 minutes  ____________________________ Barbette ReichmannVishwanath Gissell Barra, MD vh:jm D: 04/28/2013 13:18:35 ET T: 04/28/2013 14:19:22 ET JOB#: [id number]  cc: Barbette ReichmannVishwanath Equan Cogbill, MD, <Dictator> Barbette ReichmannVISHWANATH Katlyn Muldrew MD ELECTRONICALLY SIGNED 05/11/2013 18:26

## 2014-12-14 NOTE — Consult Note (Signed)
PATIENT NAME:  Haley Hill, Haley Hill MR#:  [id number] DATE OF BIRTH:  Dec 25, 1940  DATE OF CONSULTATION:  11/23/2012  REFERRING PHYSICIAN:   CONSULTING PHYSICIAN:  Illene Labrador. Angie Fava., MD  CHIEF COMPLAINT:  Right elbow and shoulder pain.   HISTORY OF PRESENT ILLNESS:  The patient is a 74 year old right-hand dominant female who apparently sustained an unwitnessed fall in her bathroom at home. The patient apparently had a syncopal episode while in the bathroom. She presented to [facility] Emergency Room at which time she was noted be hypoxic and hypoglycemic. Radiographs obtained at [facility] Emergency Room demonstrated a nondisplaced fracture of the right lateral epicondyle. The patient actually complained more this evening of pain to the right shoulder. She had some pain that extended along the trapezius musculature. She does not recall the exact mechanism of injury. She was apparently found on the floor of the bathroom.   PAST MEDICAL HISTORY:  Diabetes, hypertension, hyperlipidemia, chronic atrial fibrillation, diabetic neuropathy, gout, urinary tract infection, gastroesophageal reflux disease.   ALLERGIES:  FOSAMAX.   ADMISSION MEDICATIONS IN THE HOSPITAL:  Tylenol 650 mg q. 4 hours p.r.n., Rocephin 1 gram IV piggyback q. 24 hours, Cardizem CD 240 mg daily, gabapentin 800 mg t.i.d., isosorbide mononitrate 30 mg daily, metoprolol succinate 200 mg daily, Prilosec 20 mg q.a.m., Zofran 4 mg q. 4 hours p.r.n. nausea, Lyrica 75 mg b.i.d., simvastatin 40 mg daily, warfarin 2 mg q. 5 p.m., potassium chloride ER 20 mEq b.i.d.   SOCIAL HISTORY:  The patient denies any smoking, alcohol use or illicit drug use. She lives at home with her son.   FAMILY HISTORY:  Positive for congestive heart failure in mother.   REVIEW OF SYSTEMS:  Pertinent musculoskeletal review of systems is consistent with the diabetic neuropathy, right shoulder pain and right elbow pain.   PHYSICAL EXAMINATION: GENERAL: The patient is a well-developed,  well-nourished elderly female seen in no acute distress sitting at the edge of the bed.  NECK: Supple, nontender, with good range of motion.  RIGHT UPPER EXTREMITY: There is tenderness to palpation along the trapezius musculature on the right. No gross crepitus is noted with gentle range of motion of the shoulder, although the patient does demonstrate guarding with active or passive range of motion of the right shoulder. There is tenderness to palpation along the proximal humerus. Some ecchymosis is noted along the shoulder as well as along the shoulder girdle. Minimal swelling is noted. The skin is intact. Examination of the right elbow demonstrates some tenderness to palpation along the lateral condyle. Good pronation and supination is appreciated. Gentle flexion and extension is well tolerated. Ecchymosis is noted along the extensor surface of the elbow. Good grip strength is noted.  NEUROLOGIC: Awake, alert and oriented. Sensory function is grossly intact. Motor strength is felt to be 5/5 with the exception of the shoulder and elbow which were limited secondary to guarding. No clonus or tremor.   X-RAYS:  AP, lateral and oblique radiographs of the right elbow from November 22, 2012 were reviewed. There appears to be a nondisplaced fracture of the right lateral epicondyle. There does not appear to be any extension into the joint.   No radiographs of the shoulder were available for review.   IMPRESSION:   1.  Nondisplaced fracture of the right lateral epicondyle, distal humerus.  2.  Right shoulder pain.   PLAN:  Findings were discussed in detail with the patient. A sling has been ordered for comfort. I would like to obtain  radiographs of the right shoulder to make certain that there is no additional bony injury to the shoulder itself. Ice has been ordered to the shoulder and elbow on an as-needed basis.   ____________________________ Illene LabradorJames P. Angie FavaHooten Jr., MD jph:si D: 11/23/2012 21:31:42  ET T: 11/23/2012 21:52:25 ET JOB#: [id number]  cc: Illene LabradorJames P. Angie FavaHooten Jr., MD, <Dictator> JAMES P Angie FavaHOOTEN JR MD ELECTRONICALLY SIGNED 11/27/2012 12:06

## 2014-12-14 NOTE — Consult Note (Signed)
Chief Complaint:  Subjective/Chief Complaint NO ACUTE COMPLAINTS, GENEAL WEAKNESS, HAS WALKED TO BATHROOM, NO SOB AT MINIMAL EXERTION, NO CP, NO PALPITATION, NO EDEMA   VITAL SIGNS/ANCILLARY NOTES: **Vital Signs.:   19-Oct-14 08:37  Vital Signs Type Q 4hr  Temperature Temperature (F) 98.8  Celsius 37.1  Pulse Pulse 80  Respirations Respirations 20  Systolic BP Systolic BP 045  Diastolic BP (mmHg) Diastolic BP (mmHg) 76  Mean BP 99  Pulse Ox % Pulse Ox % 95  Pulse Ox Activity Level  At rest  Oxygen Delivery Room Air/ 21 %   Brief Assessment:  Cardiac Irregular   Respiratory normal resp effort  clear BS   Gastrointestinal details normal Nontender   EXTR negative edema, STASIS CHANGES   Lab Results: Hepatic:  19-Oct-14 04:35   Bilirubin, Total 0.4  Alkaline Phosphatase 79  SGPT (ALT) 16  SGOT (AST) 29  Total Protein, Serum  5.5  Albumin, Serum  3.0  Routine Chem:  19-Oct-14 04:35   Glucose, Serum 71  BUN  38  Creatinine (comp)  2.14  Sodium, Serum 140  Potassium, Serum  5.8  Chloride, Serum  110  CO2, Serum 26  Calcium (Total), Serum 9.0  Osmolality (calc) 287  eGFR (African American)  26  eGFR (Non-African American)  22 (eGFR values <46m/min/1.73 m2 may be an indication of chronic kidney disease (CKD). Calculated eGFR is useful in patients with stable renal function. The eGFR calculation will not be reliable in acutely ill patients when serum creatinine is changing rapidly. It is not useful in  patients on dialysis. The eGFR calculation may not be applicable to patients at the low and high extremes of body sizes, pregnant women, and vegetarians.)  Anion Gap  4  Routine Coag:  19-Oct-14 04:35   Prothrombin  18.7  INR 1.6 (INR reference interval applies to patients on anticoagulant therapy. A single INR therapeutic range for coumarins is not optimal for all indications; however, the suggested range for most indications is 2.0 - 3.0. Exceptions to the INR  Reference Range may include: Prosthetic heart valves, acute myocardial infarction, prevention of myocardial infarction, and combinations of aspirin and anticoagulant. The need for a higher or lower target INR must be assessed individually. Reference: The Pharmacology and Management of the Vitamin K  antagonists: the seventh ACCP Conference on Antithrombotic and Thrombolytic Therapy. CWUJWJ.1914Sept:126 (3suppl): [id number] A HCT value >55% may artifactually increase the PT.  In one study,  the increase was an average of 25%. Reference:  "Effect on Routine and Special Coagulation Testing Values of Citrate Anticoagulant Adjustment in Patients with High HCT Values." American Journal of Clinical Pathology 2006;126:400-405.)  Routine Hem:  19-Oct-14 04:35   WBC (CBC)  3.5  RBC (CBC)  2.40  Hemoglobin (CBC)  8.4  Hematocrit (CBC)  24.7  Platelet Count (CBC)  60  MCV  103  MCH  34.9  MCHC 33.9  RDW  15.2  Neutrophil % 60.1  Lymphocyte % 27.4  Monocyte % 6.7  Eosinophil % 5.0  Basophil % 0.8  Neutrophil # 2.1  Lymphocyte # 1.0  Monocyte # 0.2  Eosinophil # 0.2  Basophil # 0.0 (Result(s) reported on 11 Jun 2013 at 05:17AM.)   Assessment/Plan:  Assessment/Plan:  Assessment MILD LEUKOPENIA BUT TOTAL POLYS AND LYMPHS OK.  ANEMIA, HGB, 8.4, STABLE IN LAST FEW DAYS, COMPARES TO 8.4 TO 8.7 IN SEPT, DOWN FROM 11.7 IN APRIL. IRON STUDIES OK, AND HAD GI W/U APRIL 2014. NO ACTIVE BLEEDING. GENERAL  WEAKNESS BUT TOLERATED, CARDIOPULMONARY CURRENTLY STABLE. LOOKS LIKE DUE TO CHRONIC DISEASE, RECENT INFECTION/CELLULITIS, AND CHRONIC RENAL FAILURE, CRE CURRENTLY BETTER BUT STILL 2.14 AND RECENT BASELINE OVER 1.5. ALSO HAS B12 DEFICIENCY, RECENT B12 LEVEL 194, PUT ON PO B12, MCV STILL HIGH.  THROMBOCYTOPENIA STABLE AT 60K, VS 79K IN SEPT AND 101 IN APRIL, ETIOLOGY UNKNOWN, MAY ALL BE B12 DEFICIENCY, ALSO POSSIBLE MEDICATION EFFECT,  CAN NOT R/O MYELODYSPLASIA BUT WOULD NOT DO BM AT THIS TIME   Plan 1.  B12 SUBCUT TODAY, REPEAT IN 1 WEEK, THEN MONTHLY    2. CHECK B12 LEVEL DONE 2 DAYS AGO  3. DAILY CBC  4. IF PLTS HOLDING 60K OR HIGHER, CAN CONTINUE COUMADIN BUT RISK IS INCREASED FOR BLEED, IF PLTS LOWER THAN 60K NEED TO REVIEW WITH CARDIOLOGY THE INDICATIONS FOR AND RISKS OF STOPPING COUMADIN. 5. CHECK HEP C AND HIV AND EPO LEVEL  6. CANDIDATE FOR EPO FOR ANEMIA OF RENAL FAILURE IF SYMPTOMATIC, WITH SOME RISK F INCREASED THROMBOSIS AND INCREASED BP, WOULD DISCUSS WITH CARDIOLOGY 7. NO ROLE FOR BM EXAMM AT THIS TIME   Electronic Signatures: Dallas Schimke (MD)  (Signed 19-Oct-14 12:49)  Authored: Chief Complaint, VITAL SIGNS/ANCILLARY NOTES, Brief Assessment, Lab Results, Assessment/Plan   Last Updated: 19-Oct-14 12:49 by Dallas Schimke (MD)

## 2014-12-14 NOTE — Consult Note (Signed)
PATIENT NAME:  Haley Hill, Haley Hill MR#:  [id number] DATE OF BIRTH:  Jun 27, 1941  DATE OF CONSULTATION:  06/08/2013  REFERRING PHYSICIAN:  Dr. Yates DecampJohn Hill. CONSULTING PHYSICIAN:  Haley Everitt Lizabeth LeydenN. Christy Ehrsam, MD   REASON FOR CONSULTATION:  Acute renal failure, hyperkalemia.   HISTORY OF PRESENT ILLNESS:  The patient is a pleasant 74 year old Caucasian female with past medical history of hypertension, diabetes mellitus type 2, coronary artery disease, chronic atrial fibrillation, hypothyroidism, vitiligo, peripheral neuropathy, history of right renal artery stenosis, chronic kidney disease stage III with baseline creatinine 1.5, who presented to [facility] after being seen by her home health nurse. It appears that the home health nurse was concerned about bradycardia and subsequently called the patient'Hill physician. She was advised to come to the Emergency Department. Upon arrival here, the patient was indeed found to be bradycardic with a heart rate primarily in the 40s. Initial laboratory studies demonstrated severe acute renal failure with a creatinine of 5.9, potassium of 6.7, serum bicarbonate of 18. She was given calcium gluconate x 2, insulin and D50 x 2, and also received Kayexalate. Serum potassium is down to 5.8 this morning. Serum bicarbonate has also improved to 19. She is receiving IV fluid hydration with 0.9 normal saline. Urinalysis showed urine protein of 30 mg/dL, 2 RBCs per high-power field and 3 WBCs per high-power field. The patient also has evidence for coagulopathy with an INR of 7.4.   PAST MEDICAL HISTORY: 1.  Hypertension.  2.  Diabetes mellitus type 2.  3.  Coronary artery disease.  4.  Chronic atrial flutter.  5.  Hypothyroidism.  6.  Peripheral neuropathy.  7.  Right renal artery stenosis with PTCA in 2002.  8.  Chronic kidney disease stage III, baseline creatinine 1.5.  9.  Peripheral neuropathy.  10.  History of 2 benign breast biopsies.  11.  Hyperlipidemia.  12.   Gout.   ALLERGIES: FOSAMAX.   CURRENT INPATIENT MEDICATIONS:  Include allopurinol 300 mg p.o. daily, cyanocobalamin 500 mcg p.o. q. 6:00 a.m., gabapentin 800 mg p.o. q.8 hours, glipizide 10 mg p.o. b.i.d., Atarax 25 mg p.o. q.8 hours, sliding scale insulin, Imdur 30 mg p.o. daily, Synthroid 0.1 mg p.o. q. 6:00 a.m., omeprazole 20 mg p.o. q. 6:00 a.m., Zocor 40 mg p.o. at bedtime.   SOCIAL HISTORY: The patient resides in [place]. She is divorced. She has 1 son. She denies tobacco, alcohol or illicit drug use.   FAMILY HISTORY: Mother is deceased and had history of osteoarthritis. She is unclear how her father passed away.   REVIEW OF SYSTEMS:  CONSTITUTIONAL:  Denies fevers, chills, weight loss, but endorses malaise.  EYES: Denies diplopia or blurred vision.  HEENT:  Denies headaches, hearing loss. Denies epistaxis.  CARDIOVASCULAR: Currently denies chest pains or palpitations, was admitted with bradycardia.  RESPIRATORY:  Denies cough, shortness of breath or hemoptysis.  GASTROINTESTINAL:  Denies nausea, vomiting, diarrhea.  GENITOURINARY:  Denies frequency, urgency or dysuria.  MUSCULOSKELETAL:  Denies joint pain, swelling or redness.  INTEGUMENTARY:  Denies skin rashes or lesions, but does have history of vitiligo.  NEUROLOGIC: Denies focal extremity numbness, but does have generalized weakness.  PSYCHIATRIC:   Denies depression, bipolar disorder.  ENDOCRINE:  Denies polyuria or polydipsia. Has history of diabetes mellitus.  HEMATOLOGIC AND LYMPHATIC:  Denies easy bruisability, bleeding or swollen lymph nodes.  ALLERGY AND IMMUNOLOGIC:  Denies seasonal allergies or history of immunodeficiency.   PHYSICAL EXAMINATION: VITAL SIGNS:  Temperature 97.4, pulse 66, respirations 34. Blood  pressure was 138/66.  GENERAL:  Reveals ill-appearing female, currently in no acute distress.  HEENT:  Normocephalic, atraumatic. Extraocular movements are intact. Pupils equal, round, reactive to light. No  scleral icterus. Conjunctivae are pink. No epistaxis noted. Gross hearing intact. Oral mucosa moist.  NECK:  Supple without JVD or lymphadenopathy.  LUNGS:  Clear to auscultation bilaterally with normal respiratory effort.  CARDIOVASCULAR:  S1, S2. Heart rate currently 66. Periods of irregularity noted. No murmurs or rubs appreciated.  ABDOMEN:  Soft, nontender, nondistended. Bowel sounds positive. No rebound or guarding. No gross organomegaly appreciated.  EXTREMITIES:  No clubbing, cyanosis or edema noted.  NEUROLOGIC:  The patient is currently awake, alert and oriented to time, person and place. Strength is 5/5 in both upper and lower extremities.  SKIN:  Warm and dry. Areas of vitiligo noted.  MUSCULOSKELETAL:  No joint redness, swelling or tenderness appreciated.  GENITOURINARY:  No suprapubic tenderness noted at this time.  PSYCHIATRIC:  The patient with an appropriate affect and appears to have good insight into her current illness.   LABORATORY DATA:  Sodium 136, potassium 5.8, chloride 108, CO2 of 19, BUN 70, creatinine 5.5, glucose 75. LFTs are unremarkable. CBC shows WBC 6.0, hemoglobin 10.3, hematocrit 30, platelets 109. INR 7.4. Urinalysis shows urine protein of 30 mg/dL, 2 RBCs per high-power field, 3 WBCs per high-power field.   IMPRESSION: This is a 74 year old Caucasian female with past medical history of hypertension, diabetes mellitus, hyperlipidemia, hypothyroidism, gout, gastroesophageal reflux disease, history of chronic atrial flutter, chronic kidney disease stage III, baseline creatinine of 1.5, who presented to [facility] with bradycardia, acute renal failure, hyperkalemia, metabolic acidosis   PLAN:   1.  Acute renal failure/chronic kidney disease, stage III. The patient'Hill baseline creatinine is 1.5; therefore, she does appear to have underlying chronic kidney disease, stage III. Her presenting creatinine this admission was 5.9. Currently, it is  unclear as to what led to her acute renal failure. We will proceed with further workup including SPEP then a UPEP, ANA, renal ultrasound, urine sodium, urine protein to creatinine ratio. Creatinine has started to improve. I discussed the case with the nurse and advised her to keep strict ins and outs. At present, no acute indication for dialysis. We will continue to monitor renal function over the course of the hospitalization. Agree with discontinuation of furosemide as well as metformin at this time, given renal function.  2.  Hyperkalemia. Serum potassium was high upon admission at 6.7. Potassium is now down to 5.8 with conservative measures. We will give another dose of Kayexalate today. We will also start the patient on sodium bicarbonate drip to treat the acidosis, as this is contributing to her underlying hyperkalemia.  3.  Metabolic acidosis. Serum bicarbonate noted as being 19. We will place the patient on sodium bicarbonate 150 mEq IV to be administered at 75 mL per hour.  4.  Bradycardia. Likely related to electrolyte disturbances. Potassium now down to 5.8 and heart rate is up in the 60s.  5.  Anemia of chronic kidney disease. Hemoglobin noted as being 10.3. No indication for Procrit at present. Check SPEP, UPEP and serum iron studies.   I would like to thank Dr. Dan Humphreys for this kind referral. Further plan as the patient progresses.   ____________________________ Lennox Pippins, MD mnl:dmm D: 06/08/2013 10:08:12 ET T: 06/08/2013 10:32:21 ET JOB#: [id number]  cc: Lennox Pippins, MD, <Dictator> Ria Comment Guerin Lashomb MD ELECTRONICALLY SIGNED 07/12/2013 10:03

## 2014-12-14 NOTE — Consult Note (Signed)
Brief Consult Note: Diagnosis: Right lateral epicondyle fracture, nondisplaced; Right shoulder pain.   Patient was seen by consultant.   Consult note dictated.   Comments: Sling to RUE. Ice to elbow and shoulder prn. Will obtain radiographs of the right shoulder.  Electronic Signatures: Donato HeinzHooten, James P (MD)  (Signed 02-Apr-14 21:22)  Authored: Brief Consult Note   Last Updated: 02-Apr-14 21:22 by Donato HeinzHooten, James P (MD)

## 2015-01-17 ENCOUNTER — Ambulatory Visit: Payer: Medicare Other | Admitting: Podiatry

## 2015-01-25 ENCOUNTER — Encounter: Payer: Self-pay | Admitting: Internal Medicine

## 2015-01-25 ENCOUNTER — Emergency Department: Payer: Medicare Other

## 2015-01-25 ENCOUNTER — Inpatient Hospital Stay
Admission: EM | Admit: 2015-01-25 | Discharge: 2015-01-28 | DRG: 641 | Disposition: A | Discharge status: Skilled Nursing Facility | Payer: Medicare Other | Attending: Internal Medicine | Admitting: Internal Medicine

## 2015-01-25 DIAGNOSIS — E785 Hyperlipidemia, unspecified: Secondary | ICD-10-CM | POA: Diagnosis present

## 2015-01-25 DIAGNOSIS — N178 Other acute kidney failure: Secondary | ICD-10-CM | POA: Diagnosis present

## 2015-01-25 DIAGNOSIS — Z7901 Long term (current) use of anticoagulants: Secondary | ICD-10-CM

## 2015-01-25 DIAGNOSIS — Z823 Family history of stroke: Secondary | ICD-10-CM

## 2015-01-25 DIAGNOSIS — S42309A Unspecified fracture of shaft of humerus, unspecified arm, initial encounter for closed fracture: Secondary | ICD-10-CM | POA: Insufficient documentation

## 2015-01-25 DIAGNOSIS — N183 Chronic kidney disease, stage 3 (moderate): Secondary | ICD-10-CM | POA: Diagnosis present

## 2015-01-25 DIAGNOSIS — Z951 Presence of aortocoronary bypass graft: Secondary | ICD-10-CM | POA: Diagnosis not present

## 2015-01-25 DIAGNOSIS — N179 Acute kidney failure, unspecified: Secondary | ICD-10-CM | POA: Diagnosis present

## 2015-01-25 DIAGNOSIS — M545 Low back pain: Secondary | ICD-10-CM | POA: Diagnosis present

## 2015-01-25 DIAGNOSIS — E1142 Type 2 diabetes mellitus with diabetic polyneuropathy: Secondary | ICD-10-CM | POA: Diagnosis present

## 2015-01-25 DIAGNOSIS — I129 Hypertensive chronic kidney disease with stage 1 through stage 4 chronic kidney disease, or unspecified chronic kidney disease: Secondary | ICD-10-CM | POA: Diagnosis present

## 2015-01-25 DIAGNOSIS — I2510 Atherosclerotic heart disease of native coronary artery without angina pectoris: Secondary | ICD-10-CM | POA: Diagnosis present

## 2015-01-25 DIAGNOSIS — R55 Syncope and collapse: Secondary | ICD-10-CM

## 2015-01-25 DIAGNOSIS — Y92002 Bathroom of unspecified non-institutional (private) residence single-family (private) house as the place of occurrence of the external cause: Secondary | ICD-10-CM

## 2015-01-25 DIAGNOSIS — W1839XA Other fall on same level, initial encounter: Secondary | ICD-10-CM | POA: Diagnosis present

## 2015-01-25 DIAGNOSIS — Z9181 History of falling: Secondary | ICD-10-CM

## 2015-01-25 DIAGNOSIS — S42291A Other displaced fracture of upper end of right humerus, initial encounter for closed fracture: Secondary | ICD-10-CM | POA: Diagnosis present

## 2015-01-25 DIAGNOSIS — E039 Hypothyroidism, unspecified: Secondary | ICD-10-CM | POA: Diagnosis present

## 2015-01-25 DIAGNOSIS — I482 Chronic atrial fibrillation: Secondary | ICD-10-CM | POA: Diagnosis present

## 2015-01-25 DIAGNOSIS — M109 Gout, unspecified: Secondary | ICD-10-CM | POA: Diagnosis present

## 2015-01-25 DIAGNOSIS — E860 Dehydration: Secondary | ICD-10-CM | POA: Diagnosis present

## 2015-01-25 DIAGNOSIS — S42302A Unspecified fracture of shaft of humerus, left arm, initial encounter for closed fracture: Secondary | ICD-10-CM

## 2015-01-25 DIAGNOSIS — W19XXXA Unspecified fall, initial encounter: Secondary | ICD-10-CM

## 2015-01-25 DIAGNOSIS — S42301A Unspecified fracture of shaft of humerus, right arm, initial encounter for closed fracture: Secondary | ICD-10-CM | POA: Diagnosis present

## 2015-01-25 DIAGNOSIS — T796XXA Traumatic ischemia of muscle, initial encounter: Secondary | ICD-10-CM

## 2015-01-25 HISTORY — DX: Chronic kidney disease, stage 3 unspecified: N18.30

## 2015-01-25 HISTORY — DX: Chronic kidney disease, stage 3 (moderate): N18.3

## 2015-01-25 HISTORY — DX: Gout, unspecified: M10.9

## 2015-01-25 HISTORY — DX: Syncope and collapse: R55

## 2015-01-25 LAB — ETHANOL: Alcohol, Ethyl (B): <5 mg/dL (ref <5)

## 2015-01-25 LAB — GLUCOSE, CAPILLARY
Glucose-Capillary: 212 mg/dL — ABNORMAL HIGH (ref 65–99)
Glucose-Capillary: 180 mg/dL — ABNORMAL HIGH (ref 65–99)

## 2015-01-25 LAB — URINALYSIS COMPLETE WITH MICROSCOPIC (ARMC ONLY)
BILIRUBIN URINE: NEGATIVE
Glucose, UA: >500 mg/dL — AB
Ketones, ur: NEGATIVE mg/dL
Leukocytes, UA: NEGATIVE
NITRITE: NEGATIVE
PH: 5 (ref 5.0–8.0)
PROTEIN: 100 mg/dL — AB
SPECIFIC GRAVITY, URINE: 1.013 (ref 1.005–1.030)
SQUAMOUS EPITHELIAL / LPF: NONE SEEN

## 2015-01-25 LAB — COMPREHENSIVE METABOLIC PANEL
ALK PHOS: 131 U/L — AB (ref 38–126)
ALT: 26 U/L (ref 14–54)
ANION GAP: 11 (ref 5–15)
AST: 44 U/L — AB (ref 15–41)
Albumin: 3.9 g/dL (ref 3.5–5.0)
BUN: 35 mg/dL — ABNORMAL HIGH (ref 6–20)
CO2: 30 mmol/L (ref 22–32)
CREATININE: 2.01 mg/dL — AB (ref 0.44–1.00)
Calcium: 8.8 mg/dL — ABNORMAL LOW (ref 8.9–10.3)
Chloride: 97 mmol/L — ABNORMAL LOW (ref 101–111)
GFR calc Af Amer: 27 mL/min — ABNORMAL LOW (ref >60)
GFR calc non Af Amer: 23 mL/min — ABNORMAL LOW (ref >60)
Glucose, Bld: 336 mg/dL — ABNORMAL HIGH (ref 65–99)
Potassium: 3.7 mmol/L (ref 3.5–5.1)
Sodium: 138 mmol/L (ref 135–145)
TOTAL PROTEIN: 7.3 g/dL (ref 6.5–8.1)
Total Bilirubin: 1.3 mg/dL — ABNORMAL HIGH (ref 0.3–1.2)

## 2015-01-25 LAB — URINE DRUG SCREEN, QUALITATIVE (ARMC ONLY)
Amphetamines, Ur Screen: NOT DETECTED
BARBITURATES, UR SCREEN: NOT DETECTED
Benzodiazepine, Ur Scrn: NOT DETECTED
COCAINE METABOLITE, UR ~~LOC~~: NOT DETECTED
Cannabinoid 50 Ng, Ur ~~LOC~~: NOT DETECTED
MDMA (Ecstasy)Ur Screen: NOT DETECTED
Methadone Scn, Ur: NOT DETECTED
OPIATE, UR SCREEN: NOT DETECTED
Phencyclidine (PCP) Ur S: NOT DETECTED
Tricyclic, Ur Screen: NOT DETECTED

## 2015-01-25 LAB — CK: CK TOTAL: 707 U/L — AB (ref 38–234)

## 2015-01-25 LAB — CBC WITH DIFFERENTIAL/PLATELET
Basophils Absolute: 0.1 10*3/uL (ref 0–0.1)
Basophils Relative: 1 %
EOS ABS: 0 10*3/uL (ref 0–0.7)
Eosinophils Relative: 0 %
HCT: 38.5 % (ref 35.0–47.0)
HEMOGLOBIN: 12.7 g/dL (ref 12.0–16.0)
Lymphocytes Relative: 4 %
Lymphs Abs: 0.5 10*3/uL — ABNORMAL LOW (ref 1.0–3.6)
MCH: 33.6 pg (ref 26.0–34.0)
MCHC: 33 g/dL (ref 32.0–36.0)
MCV: 101.7 fL — ABNORMAL HIGH (ref 80.0–100.0)
Monocytes Absolute: 0.3 10*3/uL (ref 0.2–0.9)
Monocytes Relative: 3 %
Neutro Abs: 10.9 10*3/uL — ABNORMAL HIGH (ref 1.4–6.5)
Neutrophils Relative %: 92 %
Platelets: 102 10*3/uL — ABNORMAL LOW (ref 150–440)
RBC: 3.79 MIL/uL — ABNORMAL LOW (ref 3.80–5.20)
RDW: 16.7 % — ABNORMAL HIGH (ref 11.5–14.5)
WBC: 11.8 10*3/uL — ABNORMAL HIGH (ref 3.6–11.0)

## 2015-01-25 LAB — APTT: aPTT: 39 seconds — ABNORMAL HIGH (ref 24–36)

## 2015-01-25 LAB — MAGNESIUM: Magnesium: 2.1 mg/dL (ref 1.7–2.4)

## 2015-01-25 LAB — PROTIME-INR
INR: 1.78
PROTHROMBIN TIME: 20.9 s — AB (ref 11.4–15.0)

## 2015-01-25 LAB — TROPONIN I: Troponin I: 0.03 ng/mL (ref <0.031)

## 2015-01-25 MED ORDER — WARFARIN - PHARMACIST DOSING INPATIENT: Freq: Every day | Status: DC

## 2015-01-25 MED ORDER — INSULIN ASPART 100 UNIT/ML ~~LOC~~ SOLN
SUBCUTANEOUS | Status: AC
  Administered 2015-01-25: 4 [IU] via SUBCUTANEOUS
  Filled 2015-01-25: qty 4

## 2015-01-25 MED ORDER — ONDANSETRON HCL 4 MG/2ML IJ SOLN
INTRAMUSCULAR | Status: AC
  Administered 2015-01-25: 4 mg via INTRAVENOUS
  Filled 2015-01-25: qty 2

## 2015-01-25 MED ORDER — INSULIN ASPART 100 UNIT/ML ~~LOC~~ SOLN
0.0000 [IU] | Freq: Three times a day (TID) | SUBCUTANEOUS | Status: DC
  Administered 2015-01-25: 5 [IU] via SUBCUTANEOUS
  Administered 2015-01-26: 8 [IU] via SUBCUTANEOUS
  Administered 2015-01-26: 3 [IU] via SUBCUTANEOUS
  Administered 2015-01-26 – 2015-01-27 (×3): 5 [IU] via SUBCUTANEOUS
  Administered 2015-01-27 – 2015-01-28 (×2): 3 [IU] via SUBCUTANEOUS
  Filled 2015-01-25 (×3): qty 5
  Filled 2015-01-25: qty 3
  Filled 2015-01-25: qty 7
  Filled 2015-01-25: qty 8
  Filled 2015-01-25: qty 5
  Filled 2015-01-25: qty 3

## 2015-01-25 MED ORDER — MORPHINE SULFATE 4 MG/ML IJ SOLN
INTRAMUSCULAR | Status: AC
  Administered 2015-01-25: 4 mg via INTRAVENOUS
  Filled 2015-01-25: qty 1

## 2015-01-25 MED ORDER — CIPROFLOXACIN IN D5W 200 MG/100ML IV SOLN
200.0000 mg | Freq: Two times a day (BID) | INTRAVENOUS | Status: DC
  Administered 2015-01-25 – 2015-01-26 (×2): 200 mg via INTRAVENOUS
  Filled 2015-01-25 (×6): qty 100

## 2015-01-25 MED ORDER — ACETAMINOPHEN 650 MG RE SUPP: 650.0000 mg | Freq: Four times a day (QID) | RECTAL | Status: DC | PRN

## 2015-01-25 MED ORDER — INSULIN ASPART 100 UNIT/ML ~~LOC~~ SOLN
0.0000 [IU] | Freq: Every day | SUBCUTANEOUS | Status: DC
  Filled 2015-01-25: qty 3

## 2015-01-25 MED ORDER — ONDANSETRON HCL 4 MG/2ML IJ SOLN: 4.0000 mg | Freq: Four times a day (QID) | INTRAMUSCULAR | Status: DC | PRN

## 2015-01-25 MED ORDER — SODIUM CHLORIDE 0.9 % IV BOLUS (SEPSIS)
1000.0000 mL | Freq: Once | INTRAVENOUS | Status: AC
  Administered 2015-01-25: 1000 mL via INTRAVENOUS

## 2015-01-25 MED ORDER — INSULIN ASPART 100 UNIT/ML ~~LOC~~ SOLN
4.0000 [IU] | Freq: Once | SUBCUTANEOUS | Status: AC
  Administered 2015-01-25: 4 [IU] via SUBCUTANEOUS

## 2015-01-25 MED ORDER — ONDANSETRON HCL 4 MG/2ML IJ SOLN
4.0000 mg | Freq: Once | INTRAMUSCULAR | Status: AC
  Administered 2015-01-25: 4 mg via INTRAVENOUS

## 2015-01-25 MED ORDER — ONDANSETRON HCL 4 MG PO TABS: 4.0000 mg | ORAL_TABLET | Freq: Four times a day (QID) | ORAL | Status: DC | PRN

## 2015-01-25 MED ORDER — SODIUM CHLORIDE 0.9 % IJ SOLN
3.0000 mL | Freq: Two times a day (BID) | INTRAMUSCULAR | Status: DC
  Administered 2015-01-25 – 2015-01-28 (×4): 3 mL via INTRAVENOUS

## 2015-01-25 MED ORDER — WARFARIN SODIUM 2 MG PO TABS: 2.0000 mg | ORAL_TABLET | Freq: Every day | ORAL | Status: DC

## 2015-01-25 MED ORDER — ALBUTEROL SULFATE (2.5 MG/3ML) 0.083% IN NEBU: 2.5000 mg | INHALATION_SOLUTION | RESPIRATORY_TRACT | Status: DC | PRN

## 2015-01-25 MED ORDER — SODIUM CHLORIDE 0.9 % IV SOLN
INTRAVENOUS | Status: DC
  Administered 2015-01-25 – 2015-01-27 (×3): via INTRAVENOUS

## 2015-01-25 MED ORDER — HYDROCODONE-ACETAMINOPHEN 5-325 MG PO TABS
1.0000 | ORAL_TABLET | ORAL | Status: DC | PRN
  Administered 2015-01-26 (×3): 1 via ORAL
  Administered 2015-01-27: 2 via ORAL
  Administered 2015-01-28: 1 via ORAL
  Filled 2015-01-25 (×3): qty 1
  Filled 2015-01-25: qty 2
  Filled 2015-01-25: qty 1

## 2015-01-25 MED ORDER — MORPHINE SULFATE 2 MG/ML IJ SOLN: 2.0000 mg | INTRAMUSCULAR | Status: DC | PRN

## 2015-01-25 MED ORDER — WARFARIN SODIUM 2 MG PO TABS
2.0000 mg | ORAL_TABLET | ORAL | Status: AC
  Administered 2015-01-25: 2 mg via ORAL
  Filled 2015-01-25: qty 1

## 2015-01-25 MED ORDER — DOCUSATE SODIUM 100 MG PO CAPS
100.0000 mg | ORAL_CAPSULE | Freq: Two times a day (BID) | ORAL | Status: DC
  Administered 2015-01-25 – 2015-01-28 (×6): 100 mg via ORAL
  Filled 2015-01-25 (×6): qty 1

## 2015-01-25 MED ORDER — MORPHINE SULFATE 4 MG/ML IJ SOLN
4.0000 mg | Freq: Once | INTRAMUSCULAR | Status: AC
  Administered 2015-01-25: 4 mg via INTRAVENOUS

## 2015-01-25 MED ORDER — ACETAMINOPHEN 325 MG PO TABS
650.0000 mg | ORAL_TABLET | Freq: Four times a day (QID) | ORAL | Status: DC | PRN
  Administered 2015-01-25: 650 mg via ORAL
  Filled 2015-01-25: qty 2

## 2015-01-25 NOTE — H&P (Signed)
[facility] [place] [facility]   PATIENT NAME: Haley Hill    MR#:  [id number]  DATE OF BIRTH:  [birth year]  DATE OF ADMISSION:  01/25/2015  PRIMARY CARE PHYSICIAN: Barbette Reichmann, MD   REQUESTING/REFERRING PHYSICIAN: Dr. Governor Rooks  CHIEF COMPLAINT:   Chief Complaint  Patient presents with  . Fall  . Shoulder Pain    HISTORY OF PRESENT ILLNESS:  Haley Hill is a 74 y.o. female with history of hypertension, coronary artery disease status post CABG, hypothyroidism, hyperlipidemia, atrial fibrillation on Coumadin presents for evaluation of pain after fall. The patient and she went into the bathroom and then "I don't know what happened I must have fallen". She thinks she may have fainted.She does not know when this occurred but she was found on the floor of her bathroom by her daughter several hours later. Urinary incontinence. No witnesses seizure.  She had similar presentation in 2014 with syncope and Right humerus fracture which she has today.  She has been found to have acute renal failure and syncope. Right humerus fracture. In the emergency room.    PAST MEDICAL HISTORY:   Past Medical History  Diagnosis Date  . Diabetes   . Hypertension   . Coronary disease   . Atrial flutter   . Hypothyroidism   . Right renal artery stenosis   . Vitiligo   . Peripheral neuropathy   . Renal insufficiency   . Syncope and collapse   . CKD (chronic kidney disease) stage 3, GFR 30-59 ml/min   . Gout     PAST SURGICAL HISTORY:   Past Surgical History  Procedure Laterality Date  . Breast biopsy  10/2000    negative for cancer    SOCIAL HISTORY:   History  Substance Use Topics  . Smoking status: Never Smoker   . Smokeless tobacco: Never Used  . Alcohol Use: No    FAMILY HISTORY:   Family History  Problem Relation Age of Onset  . Gout Other   . Stroke Other   . Arthritis Other   . Osteoporosis Other     DRUG ALLERGIES:   Allergies   Allergen Reactions  . Fosamax [Alendronate Sodium] Other (See Comments)    Reaction: Ulcers in mouth and esophagus    REVIEW OF SYSTEMS:   Review of Systems  Constitutional: Positive for malaise/fatigue. Negative for fever, chills and weight loss.  HENT: Negative for hearing loss and nosebleeds.   Eyes: Negative for blurred vision, double vision and pain.  Respiratory: Negative for cough, hemoptysis, sputum production, shortness of breath and wheezing.   Cardiovascular: Negative for chest pain, palpitations, orthopnea and leg swelling.  Gastrointestinal: Negative for nausea, vomiting, abdominal pain, diarrhea and constipation.  Genitourinary: Positive for frequency (incontinence). Negative for dysuria and hematuria.  Musculoskeletal: Positive for back pain and joint pain (right shoulder). Negative for myalgias and falls.  Skin: Negative for rash.  Neurological: Negative for dizziness, tremors, sensory change, speech change, focal weakness, seizures and headaches.  Endo/Heme/Allergies: Does not bruise/bleed easily.  Psychiatric/Behavioral: Negative for depression and memory loss. The patient is not nervous/anxious.     MEDICATIONS AT HOME:   Prior to Admission medications   Medication Sig Start Date End Date Taking? Authorizing Provider  allopurinol (ZYLOPRIM) 100 MG tablet Take 200 mg by mouth daily.   Yes Historical Provider, MD  cloNIDine (CATAPRES) 0.1 MG tablet Take 0.1 mg by mouth 3 (three) times daily.   Yes Historical Provider, MD  cyanocobalamin  500 MCG tablet Take 500 mcg by mouth daily.   Yes Historical Provider, MD  ferrous sulfate 325 (65 FE) MG tablet Take 325 mg by mouth daily with breakfast.   Yes Historical Provider, MD  furosemide (LASIX) 40 MG tablet Take 80 mg by mouth 2 (two) times daily.    Yes Historical Provider, MD  gabapentin (NEURONTIN) 800 MG tablet Take 800 mg by mouth 3 (three) times daily.   Yes Historical Provider, MD  glipiZIDE (GLUCOTROL) 10 MG  tablet Take 10 mg by mouth 2 (two) times daily before a meal.   Yes Historical Provider, MD  hydrOXYzine (VISTARIL) 25 MG capsule Take 25 mg by mouth 3 (three) times daily as needed for itching.   Yes Historical Provider, MD  isosorbide mononitrate (IMDUR) 30 MG 24 hr tablet Take 30 mg by mouth every morning.    Yes Historical Provider, MD  levothyroxine (SYNTHROID, LEVOTHROID) 100 MCG tablet Take 100 mcg by mouth daily before breakfast.   Yes Historical Provider, MD  metoprolol succinate (TOPROL-XL) 100 MG 24 hr tablet Take 50 mg by mouth daily. Take with or immediately following a meal.   Yes Historical Provider, MD  omeprazole (PRILOSEC) 20 MG capsule Take 20 mg by mouth 2 (two) times daily.   Yes Historical Provider, MD  potassium chloride SA (K-DUR,KLOR-CON) 20 MEQ tablet Take 20 mEq by mouth daily.   Yes Historical Provider, MD  pregabalin (LYRICA) 150 MG capsule Take 150 mg by mouth 2 (two) times daily.   Yes Historical Provider, MD  rOPINIRole (REQUIP) 0.5 MG tablet Take 0.5 mg by mouth at bedtime.   Yes Historical Provider, MD  simvastatin (ZOCOR) 40 MG tablet Take 40 mg by mouth at bedtime.    Yes Historical Provider, MD  warfarin (COUMADIN) 1 MG tablet Take 1.5 mg by mouth daily.   Yes Historical Provider, MD      VITAL SIGNS:  Blood pressure 136/77, pulse 76, temperature 100.1 F (37.8 C), temperature source Oral, resp. rate 18, height  (1.6 m), weight 95.709 kg (211 lb), SpO2 93 %.  PHYSICAL EXAMINATION:  Physical Exam  GENERAL:  74 y.o.-year-old patient lying in the bed with no acute distress. Foul urine smell EYES: Pupils equal, round, reactive to light and accommodation. No scleral icterus. Extraocular muscles intact.  HEENT: Head atraumatic, normocephalic. Oropharynx and nasopharynx clear. No oropharyngeal erythema, moist oral mucosa  NECK:  Supple, no jugular venous distention. No thyroid enlargement, no tenderness.  LUNGS: Normal breath sounds bilaterally, no  wheezing, rales, rhonchi. No use of accessory muscles of respiration.  CARDIOVASCULAR: S1, S2 normal. No murmurs, rubs, or gallops.  ABDOMEN: Soft, nontender, nondistended. Bowel sounds present. No organomegaly or mass.  EXTREMITIES: No pedal edema, cyanosis, or clubbing. + 2 pedal & radial pulses b/l.  Right upper arm tender. No erythema or swelling. Right index finger swollen NEUROLOGIC: Cranial nerves II through XII are intact. No focal Motor or sensory deficits appreciated b/l PSYCHIATRIC: The patient is alert and oriented x 3. Good affect.  SKIN: No obvious rash, lesion, or ulcer.   LABORATORY PANEL:   CBC  Recent Labs Lab 01/25/15 1407  WBC 11.8*  HGB 12.7  HCT 38.5  PLT 102*   ------------------------------------------------------------------------------------------------------------------  Chemistries   Recent Labs Lab 01/25/15 1407  NA 138  K 3.7  CL 97*  CO2 30  GLUCOSE 336*  BUN 35*  CREATININE 2.01*  CALCIUM 8.8*  AST 44*  ALT 26  ALKPHOS 131*  BILITOT 1.3*   ------------------------------------------------------------------------------------------------------------------  Cardiac Enzymes  Recent Labs Lab 01/25/15 1407  TROPONINI 0.03   ------------------------------------------------------------------------------------------------------------------  RADIOLOGY:  Dg Chest 1 View  01/25/2015   CLINICAL DATA:  Fall. Loss of consciousness. Right chest pain. Coronary artery disease.  EXAM: CHEST  1 VIEW  COMPARISON:  06/11/2013  FINDINGS: Mild cardiomegaly is stable. No evidence of congestive heart failure. No evidence of pulmonary infiltrate. No evidence of pneumothorax or hemothorax. Prior CABG again noted.  IMPRESSION: Stable mild cardiomegaly.  No active disease.   Electronically Signed   By: Myles Rosenthal M.D.   On: 01/25/2015 14:51   Dg Thoracic Spine 2 View  01/25/2015   CLINICAL DATA:  Found on bathroom floor by daughter. Now with low back pain.   EXAM: THORACIC SPINE - 2 VIEW  COMPARISON:  Chest radiograph - earlier same day ; 11/22/2012  FINDINGS: Evaluation of the superior aspect of the thoracic spine as well as the cervical thoracic junction is degraded secondary to overlying osseous and soft tissue structures.  Unchanged severe (approximately 70%) compression deformity involving a mid/lower thoracic vertebral body, with associated focal kyphosis at this location, similar to the 11/2012 examination.  Unchanged mild (approximately 25%) compression deformity involving a mid thoracic vertebral body, also similar to the 11/2012 examination.  No anterolisthesis or retrolisthesis.  Remaining thoracic vertebral body heights appear preserved.  Thoracic intervertebral disc space heights appear preserved.  Limited visualization adjacent thorax demonstrates sequela of prior median sternotomy and CABG. Atherosclerotic plaque within thoracic aorta. Surgical clips overlie the left upper abdominal quadrant. Vascular calcifications within the splenic artery.  IMPRESSION: 1. No definite acute findings. 2. Grossly unchanged compression deformities of the mid and lower thoracic spine, similar to the 11/2012 examination.   Electronically Signed   By: Simonne Come M.D.   On: 01/25/2015 14:54   Dg Lumbar Spine 2-3 Views  01/25/2015   CLINICAL DATA:  Fall in bathroom.  Low back pain.  EXAM: LUMBAR SPINE - 2-3 VIEW  COMPARISON:  None.  FINDINGS: There is no evidence of lumbar spine fracture. Alignment is normal. Mild lumbar vertebral osteophytosis seen at all levels without significant disc space narrowing. Probable lower lumbar facet DJD noted as well as generalized osteopenia. Diffuse atherosclerotic calcification of abdominal aorta and iliac arteries noted.  IMPRESSION: No acute findings.  Mild degenerative spondylosis, as described above.   Electronically Signed   By: Myles Rosenthal M.D.   On: 01/25/2015 14:53   Dg Shoulder Right  01/25/2015   CLINICAL DATA:  Found on  bathroom floor by daughter, now with right arm pain.  EXAM: RIGHT SHOULDER - 2+ VIEW  COMPARISON:  None  FINDINGS: There is a moderately displaced complete oblique fracture involving the proximal diaphysis of the humerus with very minimal foreshortening. No intra-articular extension. Limited visualization of the adjacent glenohumeral joint appears normal. No radiopaque foreign body. Limited visualization the adjacent thorax demonstrates sequela of prior median sternotomy and CABG.  IMPRESSION: Moderately displaced complete oblique fracture involving the proximal diaphysis of the humerus without intra-articular extension.   Electronically Signed   By: Simonne Come M.D.   On: 01/25/2015 14:50   Ct Head Wo Contrast  01/25/2015   CLINICAL DATA:  Post fall. Patient found on bathroom floor by daughter. Now with right shoulder pain.  EXAM: CT HEAD WITHOUT CONTRAST  CT CERVICAL SPINE WITHOUT CONTRAST  TECHNIQUE: Multidetector CT imaging of the head and cervical spine was performed following the standard protocol without intravenous contrast. Multiplanar CT image reconstructions of the cervical  spine were also generated.  COMPARISON:  Head CT - 04/24/2013; 11/22/2012; 10/31/2008  FINDINGS: CT HEAD FINDINGS  Similar findings of advanced atrophy with sulcal prominence centralized volume loss with commensurate ex vacuo dilatation of the ventricular system. Unchanged punctate (approximately 0.6 cm) lipoma within the anterior aspect the midline falx (image 17, series 2). Scattered minimal periventricular hypodensities compatible with microvascular ischemic disease. The gray-Lafoe differentiation is otherwise well maintained without CT evidence acute large territory infarct. No intraparenchymal or extra-axial mass or hemorrhage. Unchanged size and configuration of the ventricles and basilar cisterns. No midline shift. Intracranial atherosclerosis. There is under pneumatization of the bilateral frontal sinuses, left greater than  right. The remaining paranasal sinuses and mastoid air cells are normally aerated. No air-fluid levels.  Regional soft tissues appear normal. No displaced calvarial fracture.  CT CERVICAL SPINE FINDINGS  C1 to the inferior endplate of T1 is imaged.  Normal alignment of the cervical spine. No anterolisthesis or retrolisthesis. The bilateral facets are normally aligned. The dens is normally positioned between the lateral masses of C1. Mild degenerative change of the atlantodental articulation. Normal atlantoaxial articulations.  No fracture or static subluxation of the cervical spine. Cervical vertebral body heights are preserved. Prevertebral soft tissues are normal.  There is mild-to-moderate multilevel cervical spine DDD, likely worse at C6-C7 and to a lesser extent, C4-C5 and C5-C6 with disc space height loss, endplate irregularity and small anteriorly directed disc osteophyte complexes at these locations.  Atherosclerotic plaque within the bilateral carotid bulbs. No bulky cervical lymphadenopathy on this noncontrast examination. Limited visualization of the lung apices is normal.  IMPRESSION: 1. 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 2. No fracture or static subluxation of cervical spine. 3. 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   Electronically Signed   By: Simonne ComeJohn  Watts M.D.   On: 01/25/2015 15:15   Ct Cervical Spine Wo Contrast  01/25/2015   CLINICAL DATA:  Post fall. Patient found on bathroom floor by daughter. Now with right shoulder pain.  EXAM: CT HEAD WITHOUT CONTRAST  CT CERVICAL SPINE WITHOUT CONTRAST  TECHNIQUE: Multidetector CT imaging of the head and cervical spine was performed following the standard protocol without intravenous contrast. Multiplanar CT image reconstructions of the cervical spine were also generated.  COMPARISON:  Head CT - 04/24/2013; 11/22/2012; 10/31/2008  FINDINGS: CT HEAD FINDINGS  Similar findings of  advanced atrophy with sulcal prominence centralized volume loss with commensurate ex vacuo dilatation of the ventricular system. Unchanged punctate (approximately 0.6 cm) lipoma within the anterior aspect the midline falx (image 17, series 2). Scattered minimal periventricular hypodensities compatible with microvascular ischemic disease. The gray-Hodo differentiation is otherwise well maintained without CT evidence acute large territory infarct. No intraparenchymal or extra-axial mass or hemorrhage. Unchanged size and configuration of the ventricles and basilar cisterns. No midline shift. Intracranial atherosclerosis. There is under pneumatization of the bilateral frontal sinuses, left greater than right. The remaining paranasal sinuses and mastoid air cells are normally aerated. No air-fluid levels.  Regional soft tissues appear normal. No displaced calvarial fracture.  CT CERVICAL SPINE FINDINGS  C1 to the inferior endplate of T1 is imaged.  Normal alignment of the cervical spine. No anterolisthesis or retrolisthesis. The bilateral facets are normally aligned. The dens is normally positioned between the lateral masses of C1. Mild degenerative change of the atlantodental articulation. Normal atlantoaxial articulations.  No fracture or static subluxation of the cervical spine. Cervical vertebral body heights are preserved. Prevertebral soft tissues are  normal.  There is mild-to-moderate multilevel cervical spine DDD, likely worse at C6-C7 and to a lesser extent, C4-C5 and C5-C6 with disc space height loss, endplate irregularity and small anteriorly directed disc osteophyte complexes at these locations.  Atherosclerotic plaque within the bilateral carotid bulbs. No bulky cervical lymphadenopathy on this noncontrast examination. Limited visualization of the lung apices is normal.  IMPRESSION: 1. 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 2. No fracture or static subluxation of  cervical spine. 3. 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   Electronically Signed   By: Simonne Come M.D.   On: 01/25/2015 15:15   Dg Hand Complete Right  01/25/2015   CLINICAL DATA:  Unwitnessed fall  EXAM: RIGHT HAND - COMPLETE 3+ VIEW  COMPARISON:  None.  FINDINGS: Frontal, oblique, and lateral views obtained. There is diffuse soft tissue swelling of the second digit. There is no appreciable fracture or dislocation. There is mild narrowing of all PIP and DIP joints as well as the first MCP joint. No erosive change. There is extensive arterial vascular calcification.  IMPRESSION: Diffuse swelling of the second digit. No fracture or dislocation. Multiple areas of joint space narrowing distally. Extensive arterial vascular calcification.   Electronically Signed   By: Bretta Bang III M.D.   On: 01/25/2015 14:52     IMPRESSION AND PLAN:   * Acute renal failure over CKD stage III Due to dehydration. Patient has continued to take Lasix 2 times a day. Start IV fluids, hold Lasix. Monitor input and output along with BUN and creatinine tomorrow. So has possible UTI.  * Syncope Patient had some dizziness prior to her episode of syncope. This is possibly secondary to dehydration. Check orthostatic vitals. Continue telemetry. With her history of atrial fibrillation have been monitor for any severe bradycardia or tachycardia that could have caused her syncope.  * Dehydration On IV fluids. An discharge patient's Lasix needs to be decreased from 2 times a day to once a day.  * Possible UTI Foul-smelling urine, chills, leukocytosis. Possible UTI and she will be started on ciprofloxacin. Await culture results and urinalysis results.  * Atrial fibrillation. Rate controlled. Continue home medications and Coumadin.  * Right humerus fracture. Consult orthopedics. Unlikely to be a surgical candidate at this time with her elevated INR. Shoulder immobilizer ordered.  * DVT  prophylaxis - On coumadin.  * Gout right index finger- started on a medication by her PCP but not clear which one. Start prednisone. She has ARF and do not want NSAIDs or Colchicine.  All the records are reviewed and case discussed with ED provider. Management plans discussed with the patient, family and they are in agreement.  CODE STATUS: FULL CODE  TOTAL TIME TAKING CARE OF THIS PATIENT: 45 minutes.    Milagros Loll R M.D on 01/25/2015 at 6:47 PM  Between 7am to 6pm - Pager - [phone]  After 6pm go to [email or web address] - password [email or web address] [facility]  [facility]  Office  [phone]  CC: Primary care physician; Barbette Reichmann, MD

## 2015-01-25 NOTE — ED Provider Notes (Signed)
[facility] Emergency Department Provider Note  ____________________________________________  Time seen: Approximately 1:54 PM  I have reviewed the triage vital signs and the nursing notes.   HISTORY  Chief Complaint No chief complaint on file.    HPI SARISSA DERN is a 74 y.o. female with history of hypertension, coronary artery disease status post CABG, hypothyroidism, hyperlipidemia, atrial fibrillation on Coumadin presents for evaluation of pain after fall. The patient reports she went into the bathroom and then "I don't know what happened... I must have fallen". She thinks she may have fainted.She does not know when this occurred but she was found on the floor of her bathroom by her daughter several hours later. She had become incontinent of urine while on the floor. Currently she is complaining of pain in the right shoulder, right index finger and pain in her back. Severity is moderate. Movement makes the pain worse. She denies any recent illness including no cough, sneezing, runny nose, congestion, vomiting, diarrhea, fevers or chills. She denies any chest pain or difficulty breathing. Onset was sudden.   Past Medical History  Diagnosis Date  . Diabetes   . Hypertension   . Coronary disease   . Atrial flutter   . Hypothyroidism   . Right renal artery stenosis   . Vitiligo   . Peripheral neuropathy   . Renal insufficiency     There are no active problems to display for this patient.   Past Surgical History  Procedure Laterality Date  . Breast biopsy  10/2000    negative for cancer    Current Outpatient Rx  Name  Route  Sig  Dispense  Refill  . allopurinol (ZYLOPRIM) 100 MG tablet   Oral   Take by mouth.         . Blood Glucose Calibration (OT ULTRA/FASTTK CNTRL SOLN) SOLN               . Blood Glucose Monitoring Suppl (ONE TOUCH ULTRA 2) W/DEVICE KIT               . calcitRIOL (ROCALTROL) 0.25 MCG capsule               .  CALCIUM CARBONATE PO   Oral   Take by mouth. Take 1,250 by mouth daily         . ciprofloxacin (CIPRO) 250 MG tablet               . cloNIDine (CATAPRES) 0.1 MG tablet               . colchicine (COLCRYS) 0.6 MG tablet   Oral   Take 0.6 mg by mouth daily.         . cyanocobalamin 500 MCG tablet   Oral   Take 500 mcg by mouth daily.         Marland Kitchen diltiazem (CARDIZEM CD) 240 MG 24 hr capsule   Oral   Take 240 mg by mouth daily.         Marland Kitchen docusate sodium (COLACE) 100 MG capsule   Oral   Take 100 mg by mouth 2 (two) times daily.         . ferrous sulfate 325 (65 FE) MG tablet   Oral   Take 325 mg by mouth daily with breakfast.         . furosemide (LASIX) 40 MG tablet   Oral   Take 40 mg by mouth daily.         Marland Kitchen  gabapentin (NEURONTIN) 800 MG tablet   Oral   Take 800 mg by mouth 3 (three) times daily.         Marland Kitchen glipiZIDE (GLUCOTROL) 10 MG tablet   Oral   Take 10 mg by mouth 2 (two) times daily before a meal.         . HYDROcodone-acetaminophen (NORCO/VICODIN) 5-325 MG per tablet   Oral   Take by mouth.         Marland Kitchen HYDROcodone-acetaminophen (VICODIN) 5-500 MG per tablet   Oral   Take 1 tablet by mouth daily as needed for pain.         . isosorbide mononitrate (IMDUR) 30 MG 24 hr tablet   Oral   Take 30 mg by mouth daily.         Marland Kitchen levothyroxine (SYNTHROID, LEVOTHROID) 100 MCG tablet   Oral   Take 100 mcg by mouth daily before breakfast.         . lisinopril (PRINIVIL,ZESTRIL) 20 MG tablet   Oral   Take 20 mg by mouth daily.         . metFORMIN (GLUCOPHAGE) 1000 MG tablet   Oral   Take 1,000 mg by mouth 2 (two) times daily with a meal.         . metolazone (ZAROXOLYN) 5 MG tablet   Oral   Take 5 mg by mouth daily.         . metoprolol (TOPROL-XL) 200 MG 24 hr tablet   Oral   Take 200 mg by mouth daily.         . nitrofurantoin, macrocrystal-monohydrate, (MACROBID) 100 MG capsule               . Omega-3 Fatty  Acids (FISH OIL) 1000 MG CPDR   Oral   Take by mouth.         Marland Kitchen omeprazole (PRILOSEC) 20 MG capsule               . ONE TOUCH ULTRA TEST test strip               . pregabalin (LYRICA) 75 MG capsule   Oral   Take by mouth. Take one by mouth twice a day by mouth, 2 at bedtime         . rOPINIRole (REQUIP) 0.5 MG tablet               . simvastatin (ZOCOR) 40 MG tablet   Oral   Take 40 mg by mouth daily.         . vitamin E 100 UNIT capsule   Oral   Take by mouth daily.         Marland Kitchen warfarin (COUMADIN) 2 MG tablet   Oral   Take 2 mg by mouth daily.         Marland Kitchen zolpidem (AMBIEN) 5 MG tablet                 Allergies Fosamax and Iron  Family History  Problem Relation Age of Onset  . Gout Other   . Stroke Other   . Arthritis Other   . Osteoporosis Other     Social History History  Substance Use Topics  . Smoking status: Never Smoker   . Smokeless tobacco: Never Used  . Alcohol Use: No    Review of Systems Constitutional: No fever/chills Eyes: No visual changes. ENT: No sore throat. Cardiovascular: Denies chest pain. Respiratory: Denies shortness of breath. Gastrointestinal:  No abdominal pain.  No nausea, no vomiting.  No diarrhea.  No constipation. Genitourinary: Negative for dysuria. Musculoskeletal: + for back pain. Skin: Negative for rash. Neurological: Negative for headaches, focal weakness or numbness.  10-point ROS otherwise negative.  ____________________________________________   PHYSICAL EXAM:  Filed Vitals:   01/25/15 1400  BP: 162/98  Pulse: 80  Temp: 99.4 F (37.4 C)  TempSrc: Oral  Resp: 17  Height: 5' 3"  (1.6 m)  Weight: 211 lb (95.709 kg)  SpO2: 95%     Constitutional: Alert and oriented. Well appearing and in no acute distress. + smells of urine Eyes: Conjunctivae are normal. PERRL. EOMI. Head: Atraumatic. Nose: No congestion/rhinnorhea. Mouth/Throat: Mucous membranes are moist.  Oropharynx  non-erythematous. Neck: No stridor.  No midline C-spine tenderness to palpation Cardiovascular: Normal rate, regular rhythm. Grossly normal heart sounds.  Good peripheral circulation. Respiratory: Normal respiratory effort.  No retractions. Lungs CTAB. Gastrointestinal: Soft and nontender. No distention. No abdominal bruits. No CVA tenderness. Genitourinary: deferred Musculoskeletal: Severe tenderness to palpation throughout the right shoulder and right proximal forearm. The left index finger is swollen. Mild midline tenderness to palpation throughout the T and L-spine. Pelvis stable to rock and compression, we'll range of motion of bilateral hips. Neurologic:  Normal speech and language. No gross focal neurologic deficits are appreciated. Speech is normal. No gait instability. Skin:  Skin is warm, dry and intact. No rash noted. Psychiatric: Mood and affect are normal. Speech and behavior are normal.  ____________________________________________   LABS (all labs ordered are listed, but only abnormal results are displayed)  Labs Reviewed  CULTURE, BLOOD (ROUTINE X 2)  CULTURE, BLOOD (ROUTINE X 2)  CBC WITH DIFFERENTIAL/PLATELET  COMPREHENSIVE METABOLIC PANEL  TROPONIN I  URINALYSIS COMPLETEWITH MICROSCOPIC ([facility] ONLY)  CK   ____________________________________________  EKG  ED ECG REPORT I, Joanne Gavel, the attending physician, personally viewed and interpreted this ECG.   Date: 01/25/2015  EKG Time: 15:26  Rate: 84  Rhythm: atrial fibrillation, rate 84  Axis: Normal  Intervals:none  ST&T Change: No acute ST segment change  ____________________________________________  RADIOLOGY   CT head and c-spine IMPRESSION: 1. 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 2. No fracture or static subluxation of cervical spine. 3. 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  Xray right  shoulder IMPRESSION: Moderately displaced complete oblique fracture involving the proximal diaphysis of the humerus without intra-articular extension.  Xray right hand IMPRESSION: Diffuse swelling of the second digit. No fracture or dislocation. Multiple areas of joint space narrowing distally. Extensive arterial vascular calcification.  Xray thoracic spine IMPRESSION: 1. No definite acute findings. 2. Grossly unchanged compression deformities of the mid and lower thoracic spine, similar to the 11/2012 examination.  Xay Lumbar spine FINDINGS: There is no evidence of lumbar spine fracture. Alignment is normal. Mild lumbar vertebral osteophytosis seen at all levels without significant disc space narrowing. Probable lower lumbar facet DJD noted as well as generalized osteopenia. Diffuse atherosclerotic calcification of abdominal aorta and iliac arteries noted.  IMPRESSION: No acute findings. Mild degenerative spondylosis, as described above.  CXR IMPRESSION: Stable mild cardiomegaly. No active disease.   ____________________________________________   PROCEDURES  Procedure(s) performed: None  Critical Care performed: No  ____________________________________________   INITIAL IMPRESSION / ASSESSMENT AND PLAN / ED COURSE  Pertinent labs & imaging results that were available during my care of the patient were reviewed by me and considered in my medical decision making (see chart for details).  Romie Minus  AISHA GREENBERGER is a 74 y.o. female with history of hypertension, coronary artery disease status post CABG, hypothyroidism, hyperlipidemia, atrial fibrillation on Coumadin presents for evaluation of pain after fall with concern for preceding syncopal episode. Midshaft humeral fracture on the right is noted on x-rays but the patient is neurovascular intact. Discussed with Dr. Marry Guan who recommends shoulder immobilizer. CK elevated at 707, IV fluids ordered. Mild creatinine elevation.  Given mild rhabdo, syncope without prodrome, will admit to hospitalist for further workup. ____________________________________________   FINAL CLINICAL IMPRESSION(S) / ED DIAGNOSES Rhabdomyolysis Syncope Humerus fracture, right, closed  Final diagnoses:  None      Joanne Gavel, MD 01/25/15 2240

## 2015-01-25 NOTE — ED Notes (Signed)
EMS reports pt found on floor in bathroom by daughter. Pt reports unsure of how long she was down or what happened. C/o right shoulder pain, lower back pain and right index finger pain. Pt with strong urine smell, incontinent while on floor.

## 2015-01-25 NOTE — Progress Notes (Signed)
ANTICOAGULATION CONSULT NOTE - Initial Consult  Pharmacy Consult for Warfarin  Indication: atrial fibrillation  Allergies  Allergen Reactions  . Fosamax [Alendronate Sodium] Other (See Comments)    Reaction: Ulcers in mouth and esophagus    Patient Measurements: Height:  (160 cm) Weight: 211 lb (95.709 kg) IBW/kg (Calculated) : 52.4 Heparin Dosing Weight:   Vital Signs: Temp: 100.1 F (37.8 C) (06/03 1830) Temp Source: Oral (06/03 1830) BP: 136/77 mmHg (06/03 1830) Pulse Rate: 76 (06/03 1830)  Labs:  Recent Labs  01/25/15 1407  HGB 12.7  HCT 38.5  PLT 102*  APTT 39*  LABPROT 20.9*  INR 1.78  CREATININE 2.01*  CKTOTAL 707*  TROPONINI 0.03    Estimated Creatinine Clearance: 26.2 mL/min (by C-G formula based on Cr of 2.01).   Medical History: Past Medical History  Diagnosis Date  . Diabetes   . Hypertension   . Coronary disease   . Atrial flutter   . Hypothyroidism   . Right renal artery stenosis   . Vitiligo   . Peripheral neuropathy   . Renal insufficiency   . Syncope and collapse   . CKD (chronic kidney disease) stage 3, GFR 30-59 ml/min   . Gout     Medications:  Prescriptions prior to admission  Medication Sig Dispense Refill Last Dose  . allopurinol (ZYLOPRIM) 100 MG tablet Take 200 mg by mouth daily.   01/24/2015 at 0900  . cloNIDine (CATAPRES) 0.1 MG tablet Take 0.1 mg by mouth 3 (three) times daily.   01/24/2015 at Unknown time  . cyanocobalamin 500 MCG tablet Take 500 mcg by mouth daily.   01/24/2015 at Unknown time  . ferrous sulfate 325 (65 FE) MG tablet Take 325 mg by mouth daily with breakfast.   01/24/2015 at Unknown time  . furosemide (LASIX) 40 MG tablet Take 80 mg by mouth 2 (two) times daily.    01/24/2015 at Unknown time  . gabapentin (NEURONTIN) 800 MG tablet Take 800 mg by mouth 3 (three) times daily.   01/24/2015 at Unknown time  . glipiZIDE (GLUCOTROL) 10 MG tablet Take 10 mg by mouth 2 (two) times daily before a meal.   01/24/2015 at  Unknown time  . hydrOXYzine (VISTARIL) 25 MG capsule Take 25 mg by mouth 3 (three) times daily as needed for itching.   PRN at PRN  . isosorbide mononitrate (IMDUR) 30 MG 24 hr tablet Take 30 mg by mouth every morning.    01/24/2015 at Unknown time  . levothyroxine (SYNTHROID, LEVOTHROID) 100 MCG tablet Take 100 mcg by mouth daily before breakfast.   01/24/2015 at Unknown time  . metoprolol succinate (TOPROL-XL) 100 MG 24 hr tablet Take 50 mg by mouth daily. Take with or immediately following a meal.   01/24/2015 at 0900  . omeprazole (PRILOSEC) 20 MG capsule Take 20 mg by mouth 2 (two) times daily.   01/24/2015 at Unknown time  . potassium chloride SA (K-DUR,KLOR-CON) 20 MEQ tablet Take 20 mEq by mouth daily.   01/24/2015 at Unknown time  . pregabalin (LYRICA) 150 MG capsule Take 150 mg by mouth 2 (two) times daily.   01/24/2015 at Unknown time  . rOPINIRole (REQUIP) 0.5 MG tablet Take 0.5 mg by mouth at bedtime.   01/24/2015 at Unknown time  . simvastatin (ZOCOR) 40 MG tablet Take 40 mg by mouth at bedtime.    01/24/2015 at Unknown time  . warfarin (COUMADIN) 1 MG tablet Take 1.5 mg by mouth daily.   01/24/2015  at Unknown time    Assessment: Atrial fibrillation Warfarin 1.5 mg PO daily at home.  6/3 INR:  1.8  Goal of Therapy:  INR 2-3   Plan:  Will increase dose to Warfarin 2 mg PO daily and recheck INR on 6/4 with AM labs.  Haley Hill D 01/25/2015,7:49 PM

## 2015-01-25 NOTE — Consult Note (Signed)
ORTHOPAEDIC CONSULTATION  REQUESTING PHYSICIAN: Milagros LollSrikar Sudini, MD  Chief Complaint: Right upper arm pain  HPI: Fredrik RiggerJean S Mcmurtry is a 74 y.o. right hand dominant female who had a syncopal episode when going into her bathroom earlier today and fell. She remained on the floor for an extended period of time before being found by her family. She complained of right shoulder and upper arm pain. She denied any gross numbness to the arm. She was admitted by the hospitalist service for acute renal failure.  Past Medical History  Diagnosis Date  . Diabetes   . Hypertension   . Coronary disease   . Atrial flutter   . Hypothyroidism   . Right renal artery stenosis   . Vitiligo   . Peripheral neuropathy   . Renal insufficiency   . Syncope and collapse   . CKD (chronic kidney disease) stage 3, GFR 30-59 ml/min   . Gout    Past Surgical History  Procedure Laterality Date  . Breast biopsy  10/2000    negative for cancer   History   Social History  . Marital Status: Divorced    Spouse Name: N/A  . Number of Children: N/A  . Years of Education: N/A   Social History Main Topics  . Smoking status: Never Smoker   . Smokeless tobacco: Never Used  . Alcohol Use: No  . Drug Use: No  . Sexual Activity: Not on file   Other Topics Concern  . None   Social History Narrative   Family History  Problem Relation Age of Onset  . Gout Other   . Stroke Other   . Arthritis Other   . Osteoporosis Other    Allergies  Allergen Reactions  . Fosamax [Alendronate Sodium] Other (See Comments)    Reaction: Ulcers in mouth and esophagus   Prior to Admission medications   Medication Sig Start Date End Date Taking? Authorizing Provider  allopurinol (ZYLOPRIM) 100 MG tablet Take 200 mg by mouth daily.   Yes Historical Provider, MD  cloNIDine (CATAPRES) 0.1 MG tablet Take 0.1 mg by mouth 3 (three) times daily.   Yes Historical Provider, MD  cyanocobalamin 500 MCG tablet Take 500 mcg by mouth daily.   Yes  Historical Provider, MD  ferrous sulfate 325 (65 FE) MG tablet Take 325 mg by mouth daily with breakfast.   Yes Historical Provider, MD  furosemide (LASIX) 40 MG tablet Take 80 mg by mouth 2 (two) times daily.    Yes Historical Provider, MD  gabapentin (NEURONTIN) 800 MG tablet Take 800 mg by mouth 3 (three) times daily.   Yes Historical Provider, MD  glipiZIDE (GLUCOTROL) 10 MG tablet Take 10 mg by mouth 2 (two) times daily before a meal.   Yes Historical Provider, MD  hydrOXYzine (VISTARIL) 25 MG capsule Take 25 mg by mouth 3 (three) times daily as needed for itching.   Yes Historical Provider, MD  isosorbide mononitrate (IMDUR) 30 MG 24 hr tablet Take 30 mg by mouth every morning.    Yes Historical Provider, MD  levothyroxine (SYNTHROID, LEVOTHROID) 100 MCG tablet Take 100 mcg by mouth daily before breakfast.   Yes Historical Provider, MD  metoprolol succinate (TOPROL-XL) 100 MG 24 hr tablet Take 50 mg by mouth daily. Take with or immediately following a meal.   Yes Historical Provider, MD  omeprazole (PRILOSEC) 20 MG capsule Take 20 mg by mouth 2 (two) times daily.   Yes Historical Provider, MD  potassium chloride SA (K-DUR,KLOR-CON) 20 MEQ  tablet Take 20 mEq by mouth daily.   Yes Historical Provider, MD  pregabalin (LYRICA) 150 MG capsule Take 150 mg by mouth 2 (two) times daily.   Yes Historical Provider, MD  rOPINIRole (REQUIP) 0.5 MG tablet Take 0.5 mg by mouth at bedtime.   Yes Historical Provider, MD  simvastatin (ZOCOR) 40 MG tablet Take 40 mg by mouth at bedtime.    Yes Historical Provider, MD  warfarin (COUMADIN) 1 MG tablet Take 1.5 mg by mouth daily.   Yes Historical Provider, MD   Positive ROS: All other systems have been reviewed and were otherwise negative with the exception of those mentioned in the HPI and as above.  Physical Exam: General: Alert, awake, and in no acute distress HEENT: Atraumatic and normocephalic. Sclera clear. Oropharynx clear with moist mucosa. Neck:  Supple, non-tender, and with good range of motion. Cardiovascular: Irregular rate and rhythm. Normal S1 S2. No murmurs, gallops, or rubs. Respiratory: Clear to auscultation. No use of accessory musculature GI: Abdomen is soft and non-tender Skin: Early ecchymosis to the right upper arm. Neurologic: Sensation intact. Motor function grossly intact with the exception of the right upper extremity which was not assessed due to the injury. Generalized tremor. No clonus. Psychiatric: Patient is competent for consent with normal mood and affect Lymphatic: No axillary or cervical lymphadenopathy  MUSCULOSKELETAL: Right upper extremity demonstrates moderate swelling and tenderness to palpation. Elbow non-tender.  X-rays: I reviewed radiographs of the right humerus that were obtained today. There is a comminuted fracture of the right humeral shaft. Some displacement of the fragments is noted but reasonably good alignment is noted.  Assessment: Right humeral shaft fracture   Plan: The findings were discussed with the patient. I have recommended treatment in a Sarmiento fracture brace. An order has been placed for the patient to be fitted by [employer]. In the meantime she will be placed in a sling for comfort. Cold therapy to the right arm to control swelling.  Cordai Rodrigue P. Angie Fava M.D.

## 2015-01-26 ENCOUNTER — Inpatient Hospital Stay
Admit: 2015-01-26 | Discharge: 2015-01-26 | Disposition: A | Discharge status: Home / Self Care | Payer: Medicare Other | Attending: Internal Medicine | Admitting: Internal Medicine

## 2015-01-26 LAB — BASIC METABOLIC PANEL
ANION GAP: 7 (ref 5–15)
BUN: 30 mg/dL — ABNORMAL HIGH (ref 6–20)
CALCIUM: 7.9 mg/dL — AB (ref 8.9–10.3)
CO2: 27 mmol/L (ref 22–32)
Chloride: 103 mmol/L (ref 101–111)
Creatinine, Ser: 1.75 mg/dL — ABNORMAL HIGH (ref 0.44–1.00)
GFR, EST AFRICAN AMERICAN: 31 mL/min — AB (ref >60)
GFR, EST NON AFRICAN AMERICAN: 27 mL/min — AB (ref >60)
Glucose, Bld: 155 mg/dL — ABNORMAL HIGH (ref 65–99)
Potassium: 3.8 mmol/L (ref 3.5–5.1)
Sodium: 137 mmol/L (ref 135–145)

## 2015-01-26 LAB — CBC
HCT: 31.8 % — ABNORMAL LOW (ref 35.0–47.0)
HEMOGLOBIN: 10.6 g/dL — AB (ref 12.0–16.0)
MCH: 34 pg (ref 26.0–34.0)
MCHC: 33.3 g/dL (ref 32.0–36.0)
MCV: 101.9 fL — ABNORMAL HIGH (ref 80.0–100.0)
PLATELETS: 84 10*3/uL — AB (ref 150–440)
RBC: 3.12 MIL/uL — AB (ref 3.80–5.20)
RDW: 16.7 % — AB (ref 11.5–14.5)
WBC: 7.8 10*3/uL (ref 3.6–11.0)

## 2015-01-26 LAB — GLUCOSE, CAPILLARY
GLUCOSE-CAPILLARY: 199 mg/dL — AB (ref 65–99)
GLUCOSE-CAPILLARY: 153 mg/dL — AB (ref 65–99)
Glucose-Capillary: 247 mg/dL — ABNORMAL HIGH (ref 65–99)
Glucose-Capillary: 254 mg/dL — ABNORMAL HIGH (ref 65–99)

## 2015-01-26 LAB — PROTIME-INR
INR: 1.68
PROTHROMBIN TIME: 20 s — AB (ref 11.4–15.0)

## 2015-01-26 MED ORDER — METOPROLOL SUCCINATE ER 100 MG PO TB24
100.0000 mg | ORAL_TABLET | Freq: Every day | ORAL | Status: DC
  Administered 2015-01-26 – 2015-01-28 (×3): 100 mg via ORAL
  Filled 2015-01-26 (×3): qty 1

## 2015-01-26 MED ORDER — ISOSORBIDE MONONITRATE ER 30 MG PO TB24
30.0000 mg | ORAL_TABLET | Freq: Every day | ORAL | Status: DC
  Administered 2015-01-26 – 2015-01-28 (×3): 30 mg via ORAL
  Filled 2015-01-26 (×3): qty 1

## 2015-01-26 MED ORDER — CLONIDINE HCL 0.1 MG PO TABS
0.1000 mg | ORAL_TABLET | Freq: Three times a day (TID) | ORAL | Status: DC
  Administered 2015-01-26 – 2015-01-28 (×5): 0.1 mg via ORAL
  Filled 2015-01-26 (×5): qty 1

## 2015-01-26 MED ORDER — WARFARIN SODIUM 2.5 MG PO TABS
2.5000 mg | ORAL_TABLET | Freq: Every day | ORAL | Status: DC
  Administered 2015-01-26 – 2015-01-27 (×2): 2.5 mg via ORAL
  Filled 2015-01-26 (×2): qty 1

## 2015-01-26 MED ORDER — MAGNESIUM HYDROXIDE 400 MG/5ML PO SUSP
30.0000 mL | Freq: Every day | ORAL | Status: DC | PRN
  Administered 2015-01-27: 30 mL via ORAL
  Filled 2015-01-26: qty 30

## 2015-01-26 MED ORDER — SULFAMETHOXAZOLE-TRIMETHOPRIM 800-160 MG PO TABS
1.0000 | ORAL_TABLET | Freq: Two times a day (BID) | ORAL | Status: DC
Start: 2015-01-26 — End: 2015-01-28
  Administered 2015-01-26 – 2015-01-28 (×4): 1 via ORAL
  Filled 2015-01-26 (×4): qty 1

## 2015-01-26 NOTE — Care Management Note (Signed)
Case Management Note  Patient Details  Name: Haley RiggerJean S Hill MRN: [id number] Date of Birth: [birth year]  Subjective/Objective:              74yo Haley Irma NewnessJean Hill was evaluated by PT today and agreed with PT recommendation of SNF/rehab. Clinical Social Work Haley Hill was updated. Case management will need physician prescriptions for any equipment needs.   Action/Plan:   Expected Discharge Date:                  Expected Discharge Plan:     In-House Referral:     Discharge planning Services     Post Acute Care Choice:    Choice offered to:     DME Arranged:    DME Agency:     HH Arranged:    HH Agency:     Status of Service:     Medicare Important Message Given:    Date Medicare IM Given:    Medicare IM give by:    Date Additional Medicare IM Given:    Additional Medicare Important Message give by:     If discussed at Long Length of Stay Meetings, dates discussed:    Additional Comments:  Haley Hill A, RN 01/26/2015, 3:03 PM

## 2015-01-26 NOTE — Progress Notes (Signed)
Pt with elevated bp. No taking home meds clonidine .1mg  po tid,imdur 30mg  po qd AND METOPROLOL 100MG  PO XL QD. MD ORDERS TO RESUME  THE BP MEDS. OTHER HOME MEDS HAVE NOT BEEN RESUMED.. MD TO LOOK AT OTHER MEDS IN AM

## 2015-01-26 NOTE — Progress Notes (Signed)
coumadin

## 2015-01-26 NOTE — Evaluation (Signed)
Physical Therapy Evaluation Patient Details Name: Haley Hill Steier MRN: [id number] DOB: 19-Oct-1940 Today'Hill Date: 01/26/2015   History of Present Illness  Haley Hill Scannell is a 74 y.o. female with history of hypertension, coronary artery disease status post CABG, hypothyroidism, hyperlipidemia, atrial fibrillation on Coumadin presents for evaluation of pain after fall. X-rays show right humeral fracture; Ortho consult reports patient is not appropriate for surgery; applied sling and recommended sarmiento fracture brace; Patient is NWB in RUE  Clinical Impression  74 yo Female Hill/p fall with soreness in RUE and in BLE; X-ray shows a right humeral fracture; Patient is NWB in RUE and orthotist recommends a sarmiento fracture brace and/or sling to support RUE; She reports being modified independent in all ADLs prior to admittance, walking while holding onto furniture or using a cane; Currently she is mod A for bed mobility, mod A for sit<>stand transfers, and min A with gait tasks using a hemi-walker. She ambulates with a step-to gait pattern and slow gait speed. Patient would benefit from additional skilled PT intervention to improve LE strength, balance/gait safety and increase functional mobility. PT recommends SNF/STR upon discharge to further strengthening legs and improve mobility prior to going home. Patient agreeable.    Follow Up Recommendations SNF    Equipment Recommendations       Recommendations for Other Services       Precautions / Restrictions Precautions Precautions: Fall Required Braces or Orthoses: Sling Restrictions Weight Bearing Restrictions: Yes RUE Weight Bearing: Non weight bearing      Mobility  Bed Mobility Overal bed mobility: Needs Assistance Bed Mobility: Sit to Supine       Sit to supine: Mod assist   General bed mobility comments: Requires assistance to lift BLE into bed; needs mod Vcs for rolling onto back and needs assistance for positioning  LEs  Transfers Overall transfer level: Needs assistance Equipment used: Hemi-walker Transfers: Sit to/from Stand Sit to Stand: Mod assist         General transfer comment: Patient transfers sit<>Stand with hemiwalker in LUE, from chair mod A with cues to push through BLE and to increase forward lean for increased ease of transfer; transfers from bedside commode, mod A x1, with cues for foot placement and to lean forward; patient very hesitant with transfers due to LE soreness and weakness;   Ambulation/Gait Ambulation/Gait assistance: Min assist Ambulation Distance (Feet): 5 Feet Assistive device: Hemi-walker Gait Pattern/deviations: Step-to pattern;Decreased step length - right;Decreased step length - left;Shuffle;Narrow base of support     General Gait Details: Pt demonstrates a step to gait pattern with hemi-walker, slow gait speed; TREATMENT (8 min): instructed patient in gait with hemiwalker around bed additional 12 feet with min A and mod Vcs to increase step length, cues for correct placement of hemi-walker and to increase foot clearance; requires increased cues for erect posture as patient demonstrates excessive forward trunk flexion due to fatigue;   Stairs            Wheelchair Mobility    Modified Rankin (Stroke Patients Only)       Balance Overall balance assessment: Needs assistance Sitting-balance support: Single extremity supported Sitting balance-Leahy Scale: Fair Sitting balance - Comments: able to sit edge of bed with 1 UE assist, supervision;    Standing balance support: Single extremity supported Standing balance-Leahy Scale: Fair Standing balance comment: requires CGA for standing  balance while holding onto hemi-walker  Pertinent Vitals/Pain Pain Assessment: 0-10 Pain Score: 9  Pain Location: RUE shoulder/arm Pain Descriptors / Indicators: Sore Pain Intervention(Hill): Monitored during  session;Repositioned;Patient requesting pain meds-RN notified    Home Living Family/patient expects to be discharged to:: Private residence Living Arrangements: Children Available Help at Discharge: Family (granddaughter (16) will be home all day; ) Type of Home: House Home Access: Stairs to enter;Ramped entrance Entrance Stairs-Rails: Right;Left;Can reach both Entrance Stairs-Number of Steps: 5 in front with B rails Home Layout: One level Home Equipment: Walker - 2 wheels;Bedside commode;Cane - single point Additional Comments: Lives with son/daughter-in law; she reports her son is currently hospitalized at [facility]; daughter in law works during the day;     Prior Function Level of Independence: Independent with assistive device(Hill)         Comments: was able to bathe/dress self modified independent;     Hand Dominance        Extremity/Trunk Assessment   Upper Extremity Assessment: RUE deficits/detail   RUE: Unable to fully assess due to pain;Unable to fully assess due to immobilization       Lower Extremity Assessment: Generalized weakness (BLE grossly 4/5)         Communication   Communication: No difficulties  Cognition Arousal/Alertness: Awake/alert Behavior During Therapy: WFL for tasks assessed/performed Overall Cognitive Status: Within Functional Limits for tasks assessed                      General Comments General comments (skin integrity, edema, etc.): demonstrates darkness in BLE lower legs into feet; intact light touch sensation for BUE and BLE;     Exercises        Assessment/Plan    PT Assessment Patient needs continued PT services  PT Diagnosis Difficulty walking;Generalized weakness;Acute pain   PT Problem List Decreased strength;Obesity;Decreased activity tolerance;Decreased safety awareness;Pain;Decreased balance;Decreased mobility  PT Treatment Interventions Gait training;Functional mobility training;Therapeutic  activities;Therapeutic exercise;Patient/family education;Neuromuscular re-education;Balance training   PT Goals (Current goals can be found in the Care Plan section) Acute Rehab PT Goals Patient Stated Goal: "I will do whatever you tell me. I do want to be able to walk better." PT Goal Formulation: With patient Time For Goal Achievement: 02/09/15 Potential to Achieve Goals: Good    Frequency 7X/week   Barriers to discharge Decreased caregiver support;Inaccessible home environment has steps to enter/also has ramp; but son and daughter-in law are gone during day; patient needs full caregiver support right now which is not available at home.     Co-evaluation               End of Session Equipment Utilized During Treatment: Gait belt Activity Tolerance: Patient tolerated treatment well Patient left: in bed;with call bell/phone within reach;with bed alarm set Nurse Communication: Mobility status         Time: 1315-1345 PT Time Calculation (min) (ACUTE ONLY): 30 min   Charges:   PT Evaluation $Initial PT Evaluation Tier I: 1 Procedure PT Treatments $Gait Training: 8-22 mins   PT G Codes:        Hopkins,Lotoya Casella, PT, DPT 01/26/2015, 2:08 PM

## 2015-01-26 NOTE — Progress Notes (Signed)
PT WITH RASH AND ITCHING TO LEFT UPPER ARM. CIPRO INFUSING . ARM RED. DR Dan HumphreysWALKER NOTIFIED . CIPRO D/C AND STARTED ON SEPTRA DS 1 PO BID

## 2015-01-26 NOTE — Progress Notes (Signed)
*  PRELIMINARY RESULTS* Echocardiogram 2D Echocardiogram has been performed.  Haley Hill 01/26/2015, 9:13 AM

## 2015-01-26 NOTE — Progress Notes (Signed)
ANTICOAGULATION CONSULT NOTE - Initial Consult  Pharmacy Consult for Warfarin  Indication: atrial fibrillation  Allergies  Allergen Reactions  . Fosamax [Alendronate Sodium] Other (See Comments)    Reaction: Ulcers in mouth and esophagus    Patient Measurements: Height:  (160 cm) Weight: 216 lb (97.977 kg) IBW/kg (Calculated) : 52.4 Heparin Dosing Weight:   Vital Signs: Temp: 98.1 F (36.7 C) (06/04 1131) Temp Source: Oral (06/04 1131) BP: 149/71 mmHg (06/04 1131) Pulse Rate: 76 (06/04 1131)  Labs:  Recent Labs  01/25/15 1407 01/26/15 0421  HGB 12.7 10.6*  HCT 38.5 31.8*  PLT 102* 84*  APTT 39*  --   LABPROT 20.9* 20.0*  INR 1.78 1.68  CREATININE 2.01* 1.75*  CKTOTAL 707*  --   TROPONINI 0.03  --     Estimated Creatinine Clearance: 30.5 mL/min (by C-G formula based on Cr of 1.75).   Medical History: Past Medical History  Diagnosis Date  . Diabetes   . Hypertension   . Coronary disease   . Atrial flutter   . Hypothyroidism   . Right renal artery stenosis   . Vitiligo   . Peripheral neuropathy   . Renal insufficiency   . Syncope and collapse   . CKD (chronic kidney disease) stage 3, GFR 30-59 ml/min   . Gout     Medications:  Prescriptions prior to admission  Medication Sig Dispense Refill Last Dose  . allopurinol (ZYLOPRIM) 100 MG tablet Take 200 mg by mouth daily.   01/24/2015 at 0900  . cloNIDine (CATAPRES) 0.1 MG tablet Take 0.1 mg by mouth 3 (three) times daily.   01/24/2015 at Unknown time  . cyanocobalamin 500 MCG tablet Take 500 mcg by mouth daily.   01/24/2015 at Unknown time  . ferrous sulfate 325 (65 FE) MG tablet Take 325 mg by mouth daily with breakfast.   01/24/2015 at Unknown time  . furosemide (LASIX) 40 MG tablet Take 80 mg by mouth 2 (two) times daily.    01/24/2015 at Unknown time  . gabapentin (NEURONTIN) 800 MG tablet Take 800 mg by mouth 3 (three) times daily.   01/24/2015 at Unknown time  . glipiZIDE (GLUCOTROL) 10 MG tablet Take 10  mg by mouth 2 (two) times daily before a meal.   01/24/2015 at Unknown time  . hydrOXYzine (VISTARIL) 25 MG capsule Take 25 mg by mouth 3 (three) times daily as needed for itching.   PRN at PRN  . isosorbide mononitrate (IMDUR) 30 MG 24 hr tablet Take 30 mg by mouth every morning.    01/24/2015 at Unknown time  . levothyroxine (SYNTHROID, LEVOTHROID) 100 MCG tablet Take 100 mcg by mouth daily before breakfast.   01/24/2015 at Unknown time  . metoprolol succinate (TOPROL-XL) 100 MG 24 hr tablet Take 50 mg by mouth daily. Take with or immediately following a meal.   01/24/2015 at 0900  . omeprazole (PRILOSEC) 20 MG capsule Take 20 mg by mouth 2 (two) times daily.   01/24/2015 at Unknown time  . potassium chloride SA (K-DUR,KLOR-CON) 20 MEQ tablet Take 20 mEq by mouth daily.   01/24/2015 at Unknown time  . pregabalin (LYRICA) 150 MG capsule Take 150 mg by mouth 2 (two) times daily.   01/24/2015 at Unknown time  . rOPINIRole (REQUIP) 0.5 MG tablet Take 0.5 mg by mouth at bedtime.   01/24/2015 at Unknown time  . simvastatin (ZOCOR) 40 MG tablet Take 40 mg by mouth at bedtime.    01/24/2015 at Unknown  time  . warfarin (COUMADIN) 1 MG tablet Take 1.5 mg by mouth daily.   01/24/2015 at Unknown time    Assessment: Atrial fibrillation Warfarin 1.5 mg PO daily at home.   6/3 INR:  1.8,  Warfarin 2mg  given. 6/4 INR: 1.68  Goal of Therapy:  INR 2-3   Plan:  Will increase dose to Warfarin 2.5 mg PO daily and recheck INR on 6/5 with AM labs.  Sherrel Ploch K, RPh 01/26/2015,12:33 PM

## 2015-01-26 NOTE — Progress Notes (Signed)
Patient watching tv no pain med requested at this time

## 2015-01-26 NOTE — Progress Notes (Signed)
Patient ID: Haley RiggerJean S Hill, female   DOB: [birth year], 74 y.o.   MRN: [id number] Haley NewnessJean Hill is a 74 y.o. female admitted with a right humeral fracture after a fall at home. Patient apparently had a syncopal episode related to dehydration.   ______________________________________________________________________  ROS: Review of systems is unremarkable for any active cardiac,respiratory, GI, GU, hematologic, neurologic or psychiatric systems, 10 systems reviewed.  . ciprofloxacin  200 mg Intravenous Q12H  . docusate sodium  100 mg Oral BID  . insulin aspart  0-15 Units Subcutaneous TID WC  . insulin aspart  0-5 Units Subcutaneous QHS  . sodium chloride  3 mL Intravenous Q12H  . warfarin  2 mg Oral q1800  . Warfarin - Pharmacist Dosing Inpatient   Does not apply q1800   acetaminophen **OR** acetaminophen, albuterol, HYDROcodone-acetaminophen, magnesium hydroxide, morphine injection, ondansetron **OR** ondansetron (ZOFRAN) IV   Past Medical History  Diagnosis Date  . Diabetes   . Hypertension   . Coronary disease   . Atrial flutter   . Hypothyroidism   . Right renal artery stenosis   . Vitiligo   . Peripheral neuropathy   . Renal insufficiency   . Syncope and collapse   . CKD (chronic kidney disease) stage 3, GFR 30-59 ml/min   . Gout     Past Surgical History  Procedure Laterality Date  . Breast biopsy  10/2000    negative for cancer    PHYSICAL EXAM:  BP 160/83 mmHg  Pulse 82  Temp(Src) 98.7 F (37.1 C) (Oral)  Resp 18  Ht 5\' 3"  (1.6 m)  Wt 97.977 kg (216 lb)  BMI 38.27 kg/m2  SpO2 98%  Wt Readings from Last 3 Encounters:  01/26/15 97.977 kg (216 lb)  11/29/13 82.101 kg (181 lb)           BP Readings from Last 3 Encounters:  01/26/15 160/83  03/05/14 152/82  11/29/13 142/82    Constitutional: NAD Neck: supple, no thyromegaly Respiratory: CTA, no rales or wheezes Cardiovascular: slight irregular rhythm, no murmur, no gallop Abdomen: soft, good BS,  nontender Extremities: no edema Neuro: alert and oriented, no focal motor or sensory deficits  ASSESSMENT/PLAN:  Labs and imaging studies were reviewed  Orthopedic consultation has been reviewed. Surgery is not planned. Continue the hold the diarrhetic and continued IV rehydration. The patient metabolic panel in the morning.

## 2015-01-26 NOTE — Progress Notes (Signed)
New admit with right humerus fx with sling and ice in place. Patient resting well. Iv resited to left FA  Infusing well. Po tylenol for pain.

## 2015-01-27 LAB — BASIC METABOLIC PANEL
ANION GAP: 6 (ref 5–15)
BUN: 22 mg/dL — AB (ref 6–20)
CHLORIDE: 103 mmol/L (ref 101–111)
CO2: 28 mmol/L (ref 22–32)
Calcium: 8.1 mg/dL — ABNORMAL LOW (ref 8.9–10.3)
Creatinine, Ser: 1.41 mg/dL — ABNORMAL HIGH (ref 0.44–1.00)
GFR calc Af Amer: 41 mL/min — ABNORMAL LOW (ref >60)
GFR calc non Af Amer: 35 mL/min — ABNORMAL LOW (ref >60)
Glucose, Bld: 241 mg/dL — ABNORMAL HIGH (ref 65–99)
Potassium: 4.1 mmol/L (ref 3.5–5.1)
SODIUM: 137 mmol/L (ref 135–145)

## 2015-01-27 LAB — GLUCOSE, CAPILLARY
GLUCOSE-CAPILLARY: 114 mg/dL — AB (ref 65–99)
Glucose-Capillary: 226 mg/dL — ABNORMAL HIGH (ref 65–99)
Glucose-Capillary: 241 mg/dL — ABNORMAL HIGH (ref 65–99)
Glucose-Capillary: 194 mg/dL — ABNORMAL HIGH (ref 65–99)

## 2015-01-27 LAB — PROTIME-INR
INR: 1.87
PROTHROMBIN TIME: 21.7 s — AB (ref 11.4–15.0)

## 2015-01-27 MED ORDER — GABAPENTIN 400 MG PO CAPS
800.0000 mg | ORAL_CAPSULE | Freq: Three times a day (TID) | ORAL | Status: DC
  Administered 2015-01-27 – 2015-01-28 (×4): 800 mg via ORAL
  Filled 2015-01-27 (×4): qty 2

## 2015-01-27 MED ORDER — FERROUS SULFATE 325 (65 FE) MG PO TABS
325.0000 mg | ORAL_TABLET | Freq: Every day | ORAL | Status: DC
  Administered 2015-01-27 – 2015-01-28 (×2): 325 mg via ORAL
  Filled 2015-01-27 (×2): qty 1

## 2015-01-27 MED ORDER — ROPINIROLE HCL 0.25 MG PO TABS
0.5000 mg | ORAL_TABLET | Freq: Every day | ORAL | Status: DC
  Administered 2015-01-27: 0.5 mg via ORAL
  Filled 2015-01-27: qty 2

## 2015-01-27 MED ORDER — SIMVASTATIN 40 MG PO TABS
40.0000 mg | ORAL_TABLET | Freq: Every day | ORAL | Status: DC
  Administered 2015-01-27: 40 mg via ORAL
  Filled 2015-01-27: qty 1

## 2015-01-27 MED ORDER — ALLOPURINOL 100 MG PO TABS
200.0000 mg | ORAL_TABLET | Freq: Every day | ORAL | Status: DC
Start: 2015-01-27 — End: 2015-01-28
  Administered 2015-01-27 – 2015-01-28 (×2): 200 mg via ORAL
  Filled 2015-01-27 (×2): qty 2

## 2015-01-27 MED ORDER — GLIPIZIDE 5 MG PO TABS
10.0000 mg | ORAL_TABLET | Freq: Two times a day (BID) | ORAL | Status: DC
  Administered 2015-01-27 – 2015-01-28 (×3): 10 mg via ORAL
  Filled 2015-01-27 (×3): qty 2

## 2015-01-27 MED ORDER — CYANOCOBALAMIN 500 MCG PO TABS
500.0000 ug | ORAL_TABLET | Freq: Every day | ORAL | Status: DC
  Administered 2015-01-27 – 2015-01-28 (×2): 500 ug via ORAL
  Filled 2015-01-27 (×2): qty 1

## 2015-01-27 MED ORDER — LEVOTHYROXINE SODIUM 100 MCG PO TABS
100.0000 ug | ORAL_TABLET | Freq: Every day | ORAL | Status: DC
  Administered 2015-01-28: 100 ug via ORAL
  Filled 2015-01-27: qty 1

## 2015-01-27 NOTE — Progress Notes (Signed)
RASH TO LUE HAS RESOLVED AFTER STOPPING CIPRO. SHOULDER ABDUCTOR BRACE HAS BEEN APPLIED TO RUE BY [employer]. GOOD CMS TO RUE. PAIN RELIEF WITH NORCO

## 2015-01-27 NOTE — Progress Notes (Signed)
Patient restless this shift . Pain meds po . Repositioned patient in bed with pillows for support .brace in place to right arm patient assisted up for bedside commode. Alert and oriented.

## 2015-01-27 NOTE — Progress Notes (Signed)
OOB TO CHAIR MOST OF THE DAY. MAJORITY OF HOME MEDS RESUMED. BLOOD SUGAR  DECREASING . RUE WITH ABDUCTOR BRACE INTACT . GOOD CMS TO RUE. RIGHT FOREFINGER REMAINS SWOLLEN. ALLOPURINOL RESUMED.DENIES ANY NEED FOR NORCO TODAY. PASSING FLATUS. NO RESULTS FROM  MOM YET

## 2015-01-27 NOTE — Progress Notes (Signed)
Initial Nutrition Assessment  DOCUMENTATION CODES:     INTERVENTION:   (Meals and Snacks: Cater to patient preferences)  NUTRITION DIAGNOSIS:   (No nutrition concerns at this time)   GOAL:  Patient will meet greater than or equal to 90% of their needs  MONITOR:   (Energy Intake, Electrolyte and Renal Profile)  REASON FOR ASSESSMENT:   (RD Screen Diagnosis)    ASSESSMENT:  Reason For Admission: ARF PMHx:  Past Medical History  Diagnosis Date  . Diabetes   . Hypertension   . Coronary disease   . Atrial flutter   . Hypothyroidism   . Right renal artery stenosis   . Vitiligo   . Peripheral neuropathy   . Renal insufficiency   . Syncope and collapse   . CKD (chronic kidney disease) stage 3, GFR 30-59 ml/min   . Gout     Typical Fluid/ Food Intake: 60-100% of meals recorded per I/O last 24 hrs Meal/ Snack Patterns: Patient reports a good appetite and intake of a regular diet PTA.  Supplements: none  Labs:  Electrolyte and Renal Profile:  Recent Labs Lab 01/25/15 1407 01/26/15 0421 01/27/15 0353  BUN 35* 30* 22*  CREATININE 2.01* 1.75* 1.41*  NA 138 137 137  K 3.7 3.8 4.1  MG 2.1  --   --    Protein Profile:  Recent Labs Lab 01/25/15 1407  ALBUMIN 3.9   Glucose Profile:  Recent Labs  01/26/15 2104 01/27/15 0731 01/27/15 1125  GLUCAP 153* 226* 241*    Meds: Colace, Novolog,   Physical Findings: n/a Weight Changes: No significant weight loss noted.  Height:  Ht Readings from Last 1 Encounters:  01/25/15 5\' 3"  (1.6 m)    Weight:  Wt Readings from Last 1 Encounters:  01/27/15 223 lb 8 oz (101.379 kg)    Ideal Body Weight:     Wt Readings from Last 10 Encounters:  01/27/15 223 lb 8 oz (101.379 kg)  11/29/13 181 lb (82.101 kg)    BMI:  Body mass index is 39.6 kg/(m^2).  Skin:  Reviewed, no issues  Diet Order:  Diet Carb Modified Fluid consistency:: Thin; Room service appropriate?: Yes  EDUCATION NEEDS:  No education  needs identified at this time   Intake/Output Summary (Last 24 hours) at 01/27/15 1441 Last data filed at 01/27/15 1028  Gross per 24 hour  Intake   1110 ml  Output   1325 ml  Net   -215 ml    Last BM:  unknown  Joeseph Amorracey L. Gaines, RDN Pager: [phone] Office: [phone]  LOW Care Level

## 2015-01-27 NOTE — Progress Notes (Signed)
Physical Therapy Treatment Patient Details Name: Haley Hill MRN: [id number] DOB: 1941/03/14 Today's Date: 01/27/2015    History of Present Illness Haley Hill is a 74 y.o. female with history of hypertension, coronary artery disease status post CABG, hypothyroidism, hyperlipidemia, atrial fibrillation on Coumadin presents for evaluation of pain after fall. X-rays show right humeral fracture; Ortho consult reports patient is not appropriate for surgery; applied sling and recommended sarmiento fracture brace; Patient is NWB in RUE    PT Comments    Patient reports that shoulder sling is helping somewhat. Patient able to stand from chair with supervision and verbal cueing. Patient increased ambulatory distance today, achieving 75 ft with hemiwalker. Gait is slow (1820ft/min), step-to pattern, and requires frequent cuing to erect posture. Patient continues to require skilled PT services to improve gait, transfer, and bed mobility safety and independence.  Follow Up Recommendations  SNF     Equipment Recommendations   (Hemiwalker)    Recommendations for Other Services       Precautions / Restrictions Precautions Precautions: Fall Required Braces or Orthoses: Sling Restrictions Weight Bearing Restrictions: Yes RUE Weight Bearing: Non weight bearing    Mobility  Bed Mobility                  Transfers Overall transfer level: Needs assistance Equipment used: Hemi-walker Transfers: Sit to/from Stand Sit to Stand: Min guard (Slow, cueing to sequence)         General transfer comment: Patient transferred sit <>stand from chair with hemiwalker on L. Required min asst to scoot forward in chair, cues to increase forward lean prior to standing, and cues for foot placement. Patient asked to be able to stand "at my own pace" without assistance.during lifting.  Ambulation/Gait Ambulation/Gait assistance: Min guard Ambulation Distance (Feet): 75 Feet Assistive device:  Hemi-walker Gait Pattern/deviations: Step-to pattern Gait velocity: 16 ft/min Gait velocity interpretation: <1.8 ft/sec, indicative of risk for recurrent falls General Gait Details: Patient demonstrated a slow, step-to gait pattern with forward trunk lean. Required frequent cues to look up and to increase step length. Patient was not able to increase gait velocity with cuing.    Stairs            Wheelchair Mobility    Modified Rankin (Stroke Patients Only)       Balance Overall balance assessment: Needs assistance   Sitting balance-Leahy Scale: Good     Standing balance support: Single extremity supported Standing balance-Leahy Scale: Fair Standing balance comment: Requires supervision for static standing balance while holding on to hemiwalker.                    Cognition Arousal/Alertness: Awake/alert Behavior During Therapy: WFL for tasks assessed/performed Overall Cognitive Status: Within Functional Limits for tasks assessed                      Exercises Other Exercises Other Exercises: Bilateral ankle pumps x10, active knee flexion and extension in sitting x10 bilateral with mild manual resistance.    General Comments        Pertinent Vitals/Pain Pain Assessment: 0-10 Pain Score: 8  Pain Location: rue shoulder/arm Pain Intervention(s): Monitored during session;Limited activity within patient's tolerance (Sling adjusted per patient request)    Home Living                      Prior Function            PT Goals (  current goals can now be found in the care plan section) Acute Rehab PT Goals PT Goal Formulation: With patient Time For Goal Achievement: 02/09/15 Potential to Achieve Goals: Good Progress towards PT goals: Progressing toward goals    Frequency  7X/week    PT Plan Current plan remains appropriate    Co-evaluation             End of Session Equipment Utilized During Treatment: Gait belt (shoulder  sling) Activity Tolerance: Patient tolerated treatment well (No increase in pain with treatment session.) Patient left: in chair;with call bell/phone within reach (Nursing agreed to place alarm in chair.)     Time: 1610-9604 PT Time Calculation (min) (ACUTE ONLY): 39 min  Charges:  $Gait Training: 23-37 mins $Therapeutic Exercise: 8-22 mins                    G Codes:     Buzzy Han, PT, GCS  Danen Lapaglia A Germain Osgood 01/27/2015, 11:35 AM

## 2015-01-27 NOTE — Progress Notes (Signed)
Continue to assess 

## 2015-01-28 ENCOUNTER — Encounter
Admission: RE | Admit: 2015-01-28 | Discharge: 2015-01-28 | Disposition: A | Discharge status: Home / Self Care | Payer: Medicare Other | Source: Ambulatory Visit | Attending: Internal Medicine | Admitting: Internal Medicine

## 2015-01-28 DIAGNOSIS — N189 Chronic kidney disease, unspecified: Secondary | ICD-10-CM | POA: Insufficient documentation

## 2015-01-28 DIAGNOSIS — I4891 Unspecified atrial fibrillation: Secondary | ICD-10-CM | POA: Insufficient documentation

## 2015-01-28 LAB — GLUCOSE, CAPILLARY
GLUCOSE-CAPILLARY: 117 mg/dL — AB (ref 65–99)
Glucose-Capillary: 188 mg/dL — ABNORMAL HIGH (ref 65–99)

## 2015-01-28 LAB — PROTIME-INR
INR: 2.51
PROTHROMBIN TIME: 27.2 s — AB (ref 11.4–15.0)

## 2015-01-28 MED ORDER — FUROSEMIDE 40 MG PO TABS: 40.0000 mg | ORAL_TABLET | Freq: Two times a day (BID) | ORAL | Status: DC

## 2015-01-28 MED ORDER — WARFARIN SODIUM 1 MG PO TABS
1.5000 mg | ORAL_TABLET | Freq: Every day | ORAL | Status: DC
  Filled 2015-01-28: qty 1

## 2015-01-28 MED ORDER — HYDROCODONE-ACETAMINOPHEN 5-325 MG PO TABS: 1.0000 | ORAL_TABLET | ORAL | Status: DC | PRN

## 2015-01-28 MED ORDER — DOCUSATE SODIUM 100 MG PO CAPS: 100.0000 mg | ORAL_CAPSULE | Freq: Two times a day (BID) | ORAL | Status: DC

## 2015-01-28 NOTE — Progress Notes (Signed)
Physical Therapy Treatment Patient Details Name: Haley Hill Forker MRN: [id number] DOB: April 04, 1941 Today'Hill Date: 01/28/2015    History of Present Illness Haley Hill Melaragno is a 74 y.o. female with history of hypertension, coronary artery disease status post CABG, hypothyroidism, hyperlipidemia, atrial fibrillation on Coumadin presents for evaluation of pain after fall. X-rays show right humeral fracture; Ortho consult reports patient is not appropriate for surgery; applied sling and recommended sarmiento fracture brace; Patient is NWB in RUE    PT Comments    Pt agreeable to PT, but only exercises. Pt notes she was just up on the bedside commode and just got settled into chair. Pt participates in exercises well tolerating manual resist with several exercises. Encourage ambulation   Follow Up Recommendations  SNF     Equipment Recommendations       Recommendations for Other Services       Precautions / Restrictions Restrictions Weight Bearing Restrictions: Yes RUE Weight Bearing: Non weight bearing    Mobility  Bed Mobility                  Transfers                 General transfer comment: Pt up in chair; did not wish up again, as pt just returned to chair from using bedside commode  Ambulation/Gait                 Stairs            Wheelchair Mobility    Modified Rankin (Stroke Patients Only)       Balance                                    Cognition Arousal/Alertness: Awake/alert Behavior During Therapy: WFL for tasks assessed/performed Overall Cognitive Status: Within Functional Limits for tasks assessed                      Exercises General Exercises - Lower Extremity Ankle Circles/Pumps: AROM;Both;20 reps Quad Sets: Strengthening;Both;20 reps Gluteal Sets: Strengthening;Both;20 reps Short Arc Quad: Strengthening;Both;20 reps (manual resist) Heel Slides: AROM;Both;20 reps Hip ABduction/ADduction:  Strengthening;Both;20 reps (manual resist) Straight Leg Raises: AROM;Both;20 reps Other Exercises Other Exercises: ham curl with manual resist 20x    General Comments        Pertinent Vitals/Pain Pain Assessment: 0-10 Pain Score: 5  Pain Location: R shoulder/UE Pain Intervention(Hill): Limited activity within patient'Hill tolerance    Home Living                      Prior Function            PT Goals (current goals can now be found in the care plan section) Progress towards PT goals: Progressing toward goals    Frequency  7X/week    PT Plan Current plan remains appropriate    Co-evaluation             End of Session   Activity Tolerance: Patient tolerated treatment well Patient left: in chair;with call bell/phone within reach;with chair alarm set     Time: [id number] PT Time Calculation (min) (ACUTE ONLY): 29 min  Charges:  $Therapeutic Exercise: 23-37 mins                    G Codes:      Kristeen MissHeidi Elizabeth Bishop 01/28/2015, 1:16 PM

## 2015-01-28 NOTE — Clinical Social Work Placement (Signed)
   CLINICAL SOCIAL WORK PLACEMENT  NOTE  Date:  01/28/2015  Patient Details  Name: Fredrik RiggerJean S Krenn MRN: [id number] Date of Birth: 1941-03-12  Clinical Social Work is seeking post-discharge placement for this patient at the Skilled  Nursing Facility level of care (*CSW will initial, date and re-position this form in  chart as items are completed):  Yes   Patient/family provided with [facility] Social Work Department's list of facilities offering this level of care within the geographic area requested by the patient (or if unable, by the patient's family).  Yes   Patient/family informed of their freedom to choose among providers that offer the needed level of care, that participate in Medicare, Medicaid or managed care program needed by the patient, have an available bed and are willing to accept the patient.  Yes   Patient/family informed of 's ownership interest in [facility] and [facility], as well as of the fact that they are under no obligation to receive care at these facilities.  PASRR submitted to EDS on       PASRR number received on       Existing PASRR number confirmed on 01/28/15     FL2 transmitted to all facilities in geographic area requested by pt/family on 01/28/15     FL2 transmitted to all facilities within larger geographic area on       Patient informed that his/her managed care company has contracts with or will negotiate with certain facilities, including the following:        Yes   Patient/family informed of bed offers received.  Patient chooses bed at  [facility] )     Physician recommends and patient chooses bed at      Patient to be transferred to   on  .  Patient to be transferred to facility by       Patient family notified on   of transfer.  Name of family member notified:        PHYSICIAN Please sign FL2     Additional Comment:    _______________________________________________ Haig ProphetMorgan, Atiba Kimberlin G,  LCSW 01/28/2015, 11:10 AM

## 2015-01-28 NOTE — Progress Notes (Signed)
Clinical Social Worker (CSW) presented bed offers. Patient chose [facility]. CSW will continue to follow and assist as needed.   Chima Astorino Morgan, LCSWA [phone] 

## 2015-01-28 NOTE — Progress Notes (Signed)
ANTICOAGULATION CONSULT NOTE - Follow up  Pharmacy Consult for Warfarin  Indication: atrial fibrillation  Allergies  Allergen Reactions  . Ciprofloxacin Itching and Rash  . Fosamax [Alendronate Sodium] Other (See Comments)    Reaction: Ulcers in mouth and esophagus    Patient Measurements: Height:  (160 cm) Weight: 217 lb 1.6 oz (98.476 kg) IBW/kg (Calculated) : 52.4   Vital Signs: Temp: 98.3 F (36.8 C) (06/06 1502) Temp Source: Oral (06/06 1502) BP: 118/65 mmHg (06/06 1502) Pulse Rate: 62 (06/06 1502)  Labs:  Recent Labs  01/26/15 0421 01/27/15 0353 01/28/15 0406  HGB 10.6*  --   --   HCT 31.8*  --   --   PLT 84*  --   --   LABPROT 20.0* 21.7* 27.2*  INR 1.68 1.87 2.51  CREATININE 1.75* 1.41*  --     Estimated Creatinine Clearance: 37.9 mL/min (by C-G formula based on Cr of 1.41).   Medical History: Past Medical History  Diagnosis Date  . Diabetes   . Hypertension   . Coronary disease   . Atrial flutter   . Hypothyroidism   . Right renal artery stenosis   . Vitiligo   . Peripheral neuropathy   . Renal insufficiency   . Syncope and collapse   . CKD (chronic kidney disease) stage 3, GFR 30-59 ml/min   . Gout     Medications:  Prescriptions prior to admission  Medication Sig Dispense Refill Last Dose  . allopurinol (ZYLOPRIM) 100 MG tablet Take 200 mg by mouth daily.   01/24/2015 at 0900  . cloNIDine (CATAPRES) 0.1 MG tablet Take 0.1 mg by mouth 3 (three) times daily.   01/24/2015 at Unknown time  . cyanocobalamin 500 MCG tablet Take 500 mcg by mouth daily.   01/24/2015 at Unknown time  . ferrous sulfate 325 (65 FE) MG tablet Take 325 mg by mouth daily with breakfast.   01/24/2015 at Unknown time  . gabapentin (NEURONTIN) 800 MG tablet Take 800 mg by mouth 3 (three) times daily.   01/24/2015 at Unknown time  . glipiZIDE (GLUCOTROL) 10 MG tablet Take 10 mg by mouth 2 (two) times daily before a meal.   01/24/2015 at Unknown time  . hydrOXYzine (VISTARIL) 25  MG capsule Take 25 mg by mouth 3 (three) times daily as needed for itching.   PRN at PRN  . isosorbide mononitrate (IMDUR) 30 MG 24 hr tablet Take 30 mg by mouth every morning.    01/24/2015 at Unknown time  . levothyroxine (SYNTHROID, LEVOTHROID) 100 MCG tablet Take 100 mcg by mouth daily before breakfast.   01/24/2015 at Unknown time  . metoprolol succinate (TOPROL-XL) 100 MG 24 hr tablet Take 50 mg by mouth daily. Take with or immediately following a meal.   01/24/2015 at 0900  . omeprazole (PRILOSEC) 20 MG capsule Take 20 mg by mouth 2 (two) times daily.   01/24/2015 at Unknown time  . potassium chloride SA (K-DUR,KLOR-CON) 20 MEQ tablet Take 20 mEq by mouth daily.   01/24/2015 at Unknown time  . pregabalin (LYRICA) 150 MG capsule Take 150 mg by mouth 2 (two) times daily.   01/24/2015 at Unknown time  . rOPINIRole (REQUIP) 0.5 MG tablet Take 0.5 mg by mouth at bedtime.   01/24/2015 at Unknown time  . simvastatin (ZOCOR) 40 MG tablet Take 40 mg by mouth at bedtime.    01/24/2015 at Unknown time  . warfarin (COUMADIN) 1 MG tablet Take 1.5 mg by mouth daily.  01/24/2015 at Unknown time  . [DISCONTINUED] furosemide (LASIX) 40 MG tablet Take 80 mg by mouth 2 (two) times daily.    01/24/2015 at Unknown time    Assessment: Atrial fibrillation Patient on Warfarin 1.5 mg PO daily at home.   6/3 INR:  1.8,   Warfarin 2 mg  6/4 INR:  1.68  Warfarin 2.5 mg  6/5 INR;  1.87   Warfarin 2.5 mg 6/6 INR:  2.51  Goal of Therapy:  INR 2-3   Plan:  Patient's INR with significant increase. Patient started on Septra po on 01/26/15, patient also on allopurinol. Patient is at goal but due to drug interaction with Septra would anticipate increase in INR. Will decrease Warfarin to 1.5 mg . Will recheck INR with am labs.  Bari MantisKristin Oluwatamilore Starnes PharmD Clinical Pharmacist 01/28/2015

## 2015-01-28 NOTE — Progress Notes (Addendum)
Patient is medically stable for D/C to [facility] today. Per Kim admissions coordinator patient is going to room 216-B. RN will call report at [phone] and arrange EMS for transport. Clinical Child psychotherapistocial Worker (CSW) prepared D/C packet and sent D/C summary to [employer] via carefinder. Patient is aware of above. CSW left a voicemail for patient's daughter in law [place]Kelly. Please reconsult if future social work needs arise. CSW signing off.   Patient's daughter in law MadisonKelly returned CSW's call. CSW made [place] aware of above. CSW also made [name] aware of [place] program are you okay. [name] is agreeable to patient's [facility]ing [facility].    Jetta LoutBailey Morgan, LCSWA [phone]

## 2015-01-28 NOTE — Discharge Summary (Signed)
Physician Discharge Summary  Haley Hill KTG:[id number] DOB: February 14, 1941 DOA: 01/25/2015  PCP: Tracie Harrier, MD  Admit date: 01/25/2015 Discharge date: 01/28/2015  Time spent: 83mnutes  Recommendations for Outpatient Follow-up:  1. Follow up with Dr. HMarry Guan Ortho 2. Follow up with Dr. HGinette Pitman [facility]. Call office to make appt in 1-2 weeks  5. 5 Check CBC, Met-b and Pt-INR in 2 days    Discharge Diagnoses:  Active Problems:    1 Fracture of humeral shaft, right, closed 2 Acute Renal failure secondary to dehydration 3 Chronic A-fib 4 HTN 5 Type 2 DM 6 Peripheral Neuropathy   Discharge Condition: Stable  Diet recommendation: 1800 cal  Filed Weights   01/26/15 0645 01/27/15 0405 01/28/15 0450  Weight: 97.977 kg (216 lb) 101.379 kg (223 lb 8 oz) 98.476 kg (217 lb 1.6 oz)    History of present illness:  Haley Hill a 74year old female with a history of Hypertension, CAD status post CABG, Hypothyroidism, Hyperlipidemia,Atrial fibrillation on Coumadin presents to the ED after fall patient states that she went to the bathroom and does not remember exactly what happened she was following the floor by her daughter several hours later and also had evidence for urinary incontinence. Patient was noted to have acute renal failure and also evidence for a moderately displaced complete oblique fracture involving the proximal diaphysis of the humerus without intra-articular extension CT of the head showed microvascular ischemic changes with no acute process Chest x-ray showed stable cardiomegaly with no infiltrate Patient's lab did show evidence of serum creatinine 1.75 and improved 1.41 with hydration  Hospital Course:    [facility] Dr.Hooten. He recommended nonoperative treatment with Sarmiento fracture brace and an order was placed for this to be fitted by [employer] In the  meantime she was placed in a sling for comfort: Therapy to the right arm to control swelling Patient received morphine and hydrocodone for pain relief she was stable at time of discharge Patient was discharged in stable condition to [facility] for further physical therapy she will follow-up with me Dr. HGinette Pitman and also follow with Dr. HMarry Guanorthopedist in 1- 2 weeks' time    Consultat ions: Dr. HMarry Guan Ortho  Discharge Exam: Filed Vitals:   01/28/15 0941  BP: 126/68  Pulse:   Temp:   Resp:     General: In mild discomfort Cardiovascular: S1 S2 Respiratory: Clear to auscultation. Extremities; Trace edema with venous stasis dermatitis  Discharge Instructions    Current Discharge Medication List    START taking these medications   Details  docusate sodium (COLACE) 100 MG capsule Take 1 capsule (100 mg total) by mouth 2 (two) times daily. Qty: 10 capsule, Refills: 0    HYDROcodone-acetaminophen (NORCO/VICODIN) 5-325 MG per tablet Take 1-2 tablets by mouth every 4 (four) hours as needed for moderate pain. Qty: 30 tablet, Refills: 0      CONTINUE these medications which have CHANGED   Details  furosemide (LASIX) 40 MG tablet Take 1 tablet (40 mg total) by mouth 2 (two) times daily. Qty: 60 tablet, Refills: 3      CONTINUE these medications which have NOT CHANGED   Details  allopurinol (ZYLOPRIM) 100 MG tablet Take 200 mg by mouth daily.    cloNIDine (CATAPRES) 0.1 MG tablet Take 0.1 mg by mouth 3 (three) times daily.    cyanocobalamin 500 MCG tablet Take  500 mcg by mouth daily.    ferrous sulfate 325 (65 FE) MG tablet Take 325 mg by mouth daily with breakfast.    gabapentin (NEURONTIN) 800 MG tablet Take 800 mg by mouth 3 (three) times daily.    glipiZIDE (GLUCOTROL) 10 MG tablet Take 10 mg by mouth 2 (two) times daily before a meal.    hydrOXYzine (VISTARIL) 25 MG capsule Take 25 mg by mouth 3 (three) times daily as needed for itching.    isosorbide  mononitrate (IMDUR) 30 MG 24 hr tablet Take 30 mg by mouth every morning.     levothyroxine (SYNTHROID, LEVOTHROID) 100 MCG tablet Take 100 mcg by mouth daily before breakfast.    metoprolol succinate (TOPROL-XL) 100 MG 24 hr tablet Take 50 mg by mouth daily. Take with or immediately following a meal.    omeprazole (PRILOSEC) 20 MG capsule Take 20 mg by mouth 2 (two) times daily.    potassium chloride SA (K-DUR,KLOR-CON) 20 MEQ tablet Take 20 mEq by mouth daily.    rOPINIRole (REQUIP) 0.5 MG tablet Take 0.5 mg by mouth at bedtime.    simvastatin (ZOCOR) 40 MG tablet Take 40 mg by mouth at bedtime.     warfarin (COUMADIN) 1 MG tablet Take 1.5 mg by mouth daily.      STOP taking these medications     pregabalin (LYRICA) 150 MG capsule        Allergies  Allergen Reactions  . Ciprofloxacin Itching and Rash  . Fosamax [Alendronate Sodium] Other (See Comments)    Reaction: Ulcers in mouth and esophagus      The results of significant diagnostics from this hospitalization (including imaging, microbiology, ancillary and laboratory) are listed below for reference.    Significant Diagnostic Studies: Dg Chest 1 View  01/25/2015   CLINICAL DATA:  Fall. Loss of consciousness. Right chest pain. Coronary artery disease.  EXAM: CHEST  1 VIEW  COMPARISON:  06/11/2013  FINDINGS: Mild cardiomegaly is stable. No evidence of congestive heart failure. No evidence of pulmonary infiltrate. No evidence of pneumothorax or hemothorax. Prior CABG again noted.  IMPRESSION: Stable mild cardiomegaly.  No active disease.   Electronically Signed   By: Earle Gell M.D.   On: 01/25/2015 14:51   Dg Thoracic Spine 2 View  01/25/2015   CLINICAL DATA:  Found on bathroom floor by daughter. Now with low back pain.  EXAM: THORACIC SPINE - 2 VIEW  COMPARISON:  Chest radiograph - earlier same day ; 11/22/2012  FINDINGS: Evaluation of the superior aspect of the thoracic spine as well as the cervical thoracic junction is  degraded secondary to overlying osseous and soft tissue structures.  Unchanged severe (approximately 70%) compression deformity involving a mid/lower thoracic vertebral body, with associated focal kyphosis at this location, similar to the 11/2012 examination.  Unchanged mild (approximately 25%) compression deformity involving a mid thoracic vertebral body, also similar to the 11/2012 examination.  No anterolisthesis or retrolisthesis.  Remaining thoracic vertebral body heights appear preserved.  Thoracic intervertebral disc space heights appear preserved.  Limited visualization adjacent thorax demonstrates sequela of prior median sternotomy and CABG. Atherosclerotic plaque within thoracic aorta. Surgical clips overlie the left upper abdominal quadrant. Vascular calcifications within the splenic artery.  IMPRESSION: 1. No definite acute findings. 2. Grossly unchanged compression deformities of the mid and lower thoracic spine, similar to the 11/2012 examination.   Electronically Signed   By: Sandi Mariscal M.D.   On: 01/25/2015 14:54   Dg Lumbar Spine 2-3  Views  01/25/2015   CLINICAL DATA:  Fall in bathroom.  Low back pain.  EXAM: LUMBAR SPINE - 2-3 VIEW  COMPARISON:  None.  FINDINGS: There is no evidence of lumbar spine fracture. Alignment is normal. Mild lumbar vertebral osteophytosis seen at all levels without significant disc space narrowing. Probable lower lumbar facet DJD noted as well as generalized osteopenia. Diffuse atherosclerotic calcification of abdominal aorta and iliac arteries noted.  IMPRESSION: No acute findings.  Mild degenerative spondylosis, as described above.   Electronically Signed   By: Earle Gell M.D.   On: 01/25/2015 14:53   Dg Shoulder Right  01/25/2015   CLINICAL DATA:  Found on bathroom floor by daughter, now with right arm pain.  EXAM: RIGHT SHOULDER - 2+ VIEW  COMPARISON:  None  FINDINGS: There is a moderately displaced complete oblique fracture involving the proximal diaphysis of  the humerus with very minimal foreshortening. No intra-articular extension. Limited visualization of the adjacent glenohumeral joint appears normal. No radiopaque foreign body. Limited visualization the adjacent thorax demonstrates sequela of prior median sternotomy and CABG.  IMPRESSION: Moderately displaced complete oblique fracture involving the proximal diaphysis of the humerus without intra-articular extension.   Electronically Signed   By: Sandi Mariscal M.D.   On: 01/25/2015 14:50   Ct Head Wo Contrast  01/25/2015   CLINICAL DATA:  Post fall. Patient found on bathroom floor by daughter. Now with right shoulder pain.  EXAM: CT HEAD WITHOUT CONTRAST  CT CERVICAL SPINE WITHOUT CONTRAST  TECHNIQUE: Multidetector CT imaging of the head and cervical spine was performed following the standard protocol without intravenous contrast. Multiplanar CT image reconstructions of the cervical spine were also generated.  COMPARISON:  Head CT - 04/24/2013; 11/22/2012; 10/31/2008  FINDINGS: CT HEAD FINDINGS  Similar findings of advanced atrophy with sulcal prominence centralized volume loss with commensurate ex vacuo dilatation of the ventricular system. Unchanged punctate (approximately 0.6 cm) lipoma within the anterior aspect the midline falx (image 17, series 2). Scattered minimal periventricular hypodensities compatible with microvascular ischemic disease. The gray-Fehrenbach differentiation is otherwise well maintained without CT evidence acute large territory infarct. No intraparenchymal or extra-axial mass or hemorrhage. Unchanged size and configuration of the ventricles and basilar cisterns. No midline shift. Intracranial atherosclerosis. There is under pneumatization of the bilateral frontal sinuses, left greater than right. The remaining paranasal sinuses and mastoid air cells are normally aerated. No air-fluid levels.  Regional soft tissues appear normal. No displaced calvarial fracture.  CT CERVICAL SPINE FINDINGS  C1 to  the inferior endplate of T1 is imaged.  Normal alignment of the cervical spine. No anterolisthesis or retrolisthesis. The bilateral facets are normally aligned. The dens is normally positioned between the lateral masses of C1. Mild degenerative change of the atlantodental articulation. Normal atlantoaxial articulations.  No fracture or static subluxation of the cervical spine. Cervical vertebral body heights are preserved. Prevertebral soft tissues are normal.  There is mild-to-moderate multilevel cervical spine DDD, likely worse at C6-C7 and to a lesser extent, C4-C5 and C5-C6 with disc space height loss, endplate irregularity and small anteriorly directed disc osteophyte complexes at these locations.  Atherosclerotic plaque within the bilateral carotid bulbs. No bulky cervical lymphadenopathy on this noncontrast examination. Limited visualization of the lung apices is normal.  IMPRESSION: 1. 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 2. No fracture or static subluxation of cervical spine. 3. 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   Electronically Signed   By: Sandi Mariscal  M.D.   On: 01/25/2015 15:15   Ct Cervical Spine Wo Contrast  01/25/2015   CLINICAL DATA:  Post fall. Patient found on bathroom floor by daughter. Now with right shoulder pain.  EXAM: CT HEAD WITHOUT CONTRAST  CT CERVICAL SPINE WITHOUT CONTRAST  TECHNIQUE: Multidetector CT imaging of the head and cervical spine was performed following the standard protocol without intravenous contrast. Multiplanar CT image reconstructions of the cervical spine were also generated.  COMPARISON:  Head CT - 04/24/2013; 11/22/2012; 10/31/2008  FINDINGS: CT HEAD FINDINGS  Similar findings of advanced atrophy with sulcal prominence centralized volume loss with commensurate ex vacuo dilatation of the ventricular system. Unchanged punctate (approximately 0.6 cm) lipoma within the anterior aspect the  midline falx (image 17, series 2). Scattered minimal periventricular hypodensities compatible with microvascular ischemic disease. The gray-Fisk differentiation is otherwise well maintained without CT evidence acute large territory infarct. No intraparenchymal or extra-axial mass or hemorrhage. Unchanged size and configuration of the ventricles and basilar cisterns. No midline shift. Intracranial atherosclerosis. There is under pneumatization of the bilateral frontal sinuses, left greater than right. The remaining paranasal sinuses and mastoid air cells are normally aerated. No air-fluid levels.  Regional soft tissues appear normal. No displaced calvarial fracture.  CT CERVICAL SPINE FINDINGS  C1 to the inferior endplate of T1 is imaged.  Normal alignment of the cervical spine. No anterolisthesis or retrolisthesis. The bilateral facets are normally aligned. The dens is normally positioned between the lateral masses of C1. Mild degenerative change of the atlantodental articulation. Normal atlantoaxial articulations.  No fracture or static subluxation of the cervical spine. Cervical vertebral body heights are preserved. Prevertebral soft tissues are normal.  There is mild-to-moderate multilevel cervical spine DDD, likely worse at C6-C7 and to a lesser extent, C4-C5 and C5-C6 with disc space height loss, endplate irregularity and small anteriorly directed disc osteophyte complexes at these locations.  Atherosclerotic plaque within the bilateral carotid bulbs. No bulky cervical lymphadenopathy on this noncontrast examination. Limited visualization of the lung apices is normal.  IMPRESSION: 1. 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 2. No fracture or static subluxation of cervical spine. 3. 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   Electronically Signed   By: Sandi Mariscal M.D.   On: 01/25/2015 15:15   Dg Hand Complete Right  01/25/2015   CLINICAL  DATA:  Unwitnessed fall  EXAM: RIGHT HAND - COMPLETE 3+ VIEW  COMPARISON:  None.  FINDINGS: Frontal, oblique, and lateral views obtained. There is diffuse soft tissue swelling of the second digit. There is no appreciable fracture or dislocation. There is mild narrowing of all PIP and DIP joints as well as the first MCP joint. No erosive change. There is extensive arterial vascular calcification.  IMPRESSION: Diffuse swelling of the second digit. No fracture or dislocation. Multiple areas of joint space narrowing distally. Extensive arterial vascular calcification.   Electronically Signed   By: Lowella Grip III M.D.   On: 01/25/2015 14:52    Microbiology: Recent Results (from the past 240 hour(s))  Blood culture (routine x 2)     Status: None (Preliminary result)   Collection Time: 01/25/15  3:12 PM  Result Value Ref Range Status   Specimen Description BLOOD  Final   Special Requests BLOOD  Final   Culture NO GROWTH 3 DAYS  Final   Report Status PENDING  Incomplete  Blood culture (routine x 2)     Status: None (Preliminary result)   Collection Time:  01/25/15  3:13 PM  Result Value Ref Range Status   Specimen Description BLOOD  Final   Special Requests BLOOD  Final   Culture NO GROWTH 3 DAYS  Final   Report Status PENDING  Incomplete     Labs: Basic Metabolic Panel:  Recent Labs Lab 01/25/15 1407 01/26/15 0421 01/27/15 0353  NA 138 137 137  K 3.7 3.8 4.1  CL 97* 103 103  CO2 30 27 28   GLUCOSE 336* 155* 241*  BUN 35* 30* 22*  CREATININE 2.01* 1.75* 1.41*  CALCIUM 8.8* 7.9* 8.1*  MG 2.1  --   --    Liver Function Tests:  Recent Labs Lab 01/25/15 1407  AST 44*  ALT 26  ALKPHOS 131*  BILITOT 1.3*  PROT 7.3  ALBUMIN 3.9   No results for input(s): LIPASE, AMYLASE in the last 168 hours. No results for input(s): AMMONIA in the last 168 hours. CBC:  Recent Labs Lab 01/25/15 1407 01/26/15 0421  WBC 11.8* 7.8  NEUTROABS 10.9*  --   HGB 12.7 10.6*  HCT 38.5 31.8*   MCV 101.7* 101.9*  PLT 102* 84*   Cardiac Enzymes:  Recent Labs Lab 01/25/15 1407  CKTOTAL 707*  TROPONINI 0.03   BNP: BNP (last 3 results) No results for input(s): BNP in the last 8760 hours.  ProBNP (last 3 results) No results for input(s): PROBNP in the last 8760 hours.  CBG:  Recent Labs Lab 01/27/15 1125 01/27/15 1534 01/27/15 2101 01/28/15 0737 01/28/15 1121  GLUCAP 241* 194* 114* 117* 188*       Signed:  Zaniel Marineau   01/28/2015, 12:55 PM

## 2015-01-28 NOTE — Progress Notes (Signed)
Patient ID: Haley RiggerJean S Hill, female   DOB: August 14, 1941, 74 y.o.   MRN: [id number] Haley NewnessJean Hill is a 74 y.o. 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 Patient apparently had a syncopal episode related to dehydration. This am feels better   ______________________________________________________________________  ROS: Review of systems is unremarkable for any active cardiac,respiratory, GI, GU, hematologic, neurologic or psychiatric systems, 10 systems reviewed.  Marland Kitchen. allopurinol  200 mg Oral Daily  . cloNIDine  0.1 mg Oral TID  . vitamin B-12  500 mcg Oral Daily  . docusate sodium  100 mg Oral BID  . ferrous sulfate  325 mg Oral Q breakfast  . gabapentin  800 mg Oral TID  . glipiZIDE  10 mg Oral BID AC  . insulin aspart  0-15 Units Subcutaneous TID WC  . insulin aspart  0-5 Units Subcutaneous QHS  . isosorbide mononitrate  30 mg Oral Daily  . levothyroxine  100 mcg Oral QAC breakfast  . metoprolol succinate  100 mg Oral Daily  . rOPINIRole  0.5 mg Oral QHS  . simvastatin  40 mg Oral q1800  . sodium chloride  3 mL Intravenous Q12H  . sulfamethoxazole-trimethoprim  1 tablet Oral Q12H  . warfarin  1.5 mg Oral q1800  . Warfarin - Pharmacist Dosing Inpatient   Does not apply q1800   acetaminophen **OR** acetaminophen, albuterol, HYDROcodone-acetaminophen, magnesium hydroxide, morphine injection, ondansetron **OR** ondansetron (ZOFRAN) IV   Past Medical History  Diagnosis Date  . Diabetes   . Hypertension   . Coronary disease   . Atrial flutter   . Hypothyroidism   . Right renal artery stenosis   . Vitiligo   . Peripheral neuropathy   . Renal insufficiency   . Syncope and collapse   . CKD (chronic kidney disease) stage 3, GFR 30-59 ml/min   . Gout     Past Surgical History  Procedure Laterality Date  . Breast biopsy  10/2000    negative for cancer    PHYSICAL EXAM:  BP 125/59 mmHg  Pulse 103  Temp(Src) 98 F  (36.7 C) (Oral)  Resp 18  Ht 5\' 3"  (1.6 m)  Wt 98.476 kg (217 lb 1.6 oz)  BMI 38.47 kg/m2  SpO2 87%  Wt Readings from Last 3 Encounters:  01/28/15 98.476 kg (217 lb 1.6 oz)  11/29/13 82.101 kg (181 lb)           BP Readings from Last 3 Encounters:  01/28/15 125/59  03/05/14 152/82  11/29/13 142/82    Constitutional: NAD Neck: supple, no thyromegaly Respiratory: CTA, no rales or wheezes Cardiovascular: slight irregular rhythm, no murmur, no gallop Abdomen: soft, good BS, nontender Extremities: no edema. Rt arm in sling  Neuro: alert and oriented, no focal motor or sensory deficits  ASSESSMENT/PLAN:   1. Rt humerus fracture following a  Fall : Pt is being treated conservatively with non operative management 2 Acute Renal failure secondary to dehydration: Doing better- Se Creat improved to 1.41 3 Type 2 DM; On SSI 4 For Rehab later today

## 2015-01-28 NOTE — Progress Notes (Signed)
Patient resting better this shift. Plans for SNF on d/c. right arm in brace with sling to maintain support.Marland Kitchen.up with 2 assists and working well with PT

## 2015-01-28 NOTE — Clinical Social Work Placement (Signed)
   CLINICAL SOCIAL WORK PLACEMENT  NOTE  Date:  01/28/2015  Patient Details  Name: Haley Hill MRN: [id number] Date of Birth: [birth year]  Clinical Social Work is seeking post-discharge placement for this patient at the Skilled  Nursing Facility level of care (*CSW will initial, date and re-position this form in  chart as items are completed):  Yes   Patient/family provided with [place] Clinical Social Work Department's list of facilities offering this level of care within the geographic area requested by the patient (or if unable, by the patient's family).  Yes   Patient/family informed of their freedom to choose among providers that offer the needed level of care, that participate in Medicare, Medicaid or managed care program needed by the patient, have an available bed and are willing to accept the patient.  Yes   Patient/family informed of 's ownership interest in [facility] and [facility], as well as of the fact that they are under no obligation to receive care at these facilities.  PASRR submitted to EDS on       PASRR number received on       Existing PASRR number confirmed on 01/28/15     FL2 transmitted to all facilities in geographic area requested by pt/family on 01/28/15     FL2 transmitted to all facilities within larger geographic area on       Patient informed that his/her managed care company has contracts with or will negotiate with certain facilities, including the following:        Yes   Patient/family informed of bed offers received.  Patient chooses bed at  [facility]     Physician recommends and patient chooses bed at      Patient to be transferred to  [facility] ) on 01/28/15.  Patient to be transferred to facility by  (EMS )     Patient family notified on 01/28/15 of transfer.  Name of family member notified:   (Daughter in law [name] was left a voicemail)     PHYSICIAN       Additional Comment:     _______________________________________________ Haig ProphetMorgan, Bryen Hinderman G, LCSW 01/28/2015, 1:29 PM

## 2015-01-28 NOTE — Clinical Social Work Note (Signed)
Clinical Social Work Assessment  Patient Details  Name: Haley Hill MRN: [id number] Date of Birth: 1940-10-09  Date of referral:  01/28/15               Reason for consult:  Facility Placement                Permission sought to share information with:  Chartered certified accountant granted to share information::  Yes, Verbal Permission Granted  Name::      IT sales professional::   [place]   Relationship::   [place]:     Housing/Transportation Living arrangements for the past 2 months:  [place] of Information:  Patient Patient Interpreter Needed:  None Criminal Activity/Legal Involvement Pertinent to Current Situation/Hospitalization:  No - Comment as needed Significant Relationships:  Adult Children Lives with:  Adult Children, Minor Children Do you feel safe going back to the place where you live?  Yes Need for family participation in patient care:  Yes (Comment)  Care giving concerns: Patient lives in [place] with her son, daughter in law, and 2 grandchildren.    Social Worker assessment / plan:  Holiday representative (CSW) received SNF consult. CSW met with patient at bedside to discuss D/C plan. CSW introduced self and explained role of CSW department. Patient reported that she lives with Haley Hill with her son, daughter in law, and 2 grandchildren. Patient reported that she fell while everyone was at work and her 23 year old granddaughter came home early from school and found her and called 911. CSW explained SNF process. Patient is agreeable to SNF search and prefers [facility]. Patient reported that she has been to [facility] before. Patient also asked about transportation to doctors appointments becaues her family works Health visitor the day. CSW explained that SNF will arrange transport while she is at rehab and she can use Scientist, research (physical sciences) ([place]) when she is home or [employer] (bus  system).   FL2 complete and faxed out.   Employment status:  Retired Nurse, adult PT Recommendations:  [place] / Referral to community resources:  [place]  Patient/Family's Response to care: Patient is agreeable to [facility] and prefers [facility]   Patient/Family's Understanding of and Emotional Response to Diagnosis, Current Treatment, and Prognosis: Patient was pleasant sitting up in the chair. Patient made good eye contact. Patient is agreeable to SNF.   Emotional Assessment Appearance:  Appears stated age Attitude/Demeanor/Rapport:    Affect (typically observed):  Accepting, Pleasant Orientation:  Oriented to Self, Oriented to Place, Oriented to  Time, Oriented to Situation Alcohol / Substance use:  Not Applicable Psych involvement (Current and /or in the community):  No (Comment)  Discharge Needs  Concerns to be addressed:  No discharge needs identified Readmission within the last 30 days:  No Current discharge risk:  Chronically ill Barriers to Discharge:  Continued Medical Work up   Loralyn Freshwater, LCSW 01/28/2015, 11:11 AM

## 2015-01-28 NOTE — Progress Notes (Signed)
Pt discharged to [facility] via EMS

## 2015-01-28 NOTE — Progress Notes (Addendum)
Subjective :Patient is day 3 her proximal right humerus Patient reports pain as moderate.   no nausea and no vomiting No chest pain or sob.  Patient up to  bedside commode on today's visit  Objective: Vital signs in last 24 hours: Temp:  [97.8 F (36.6 C)-98.4 F (36.9 C)] 98 F (36.7 C) (06/06 0807) Pulse Rate:  [56-103] 61 (06/06 0909) Resp:  [18-19] 18 (06/06 0807) BP: (102-126)/(50-74) 126/68 mmHg (06/06 0941) SpO2:  [84 %-98 %] 98 % (06/06 0909) Weight:  [98.476 kg (217 lb 1.6 oz)] 98.476 kg (217 lb 1.6 oz) (06/06 0450) Wound check No wound to address no sensory deficits noted Range of motion of right shoulder not examined on today's visit. Good range of motion of the wrist.  swelling and pain right upper extremity. He has good range of motion of fingers or hand. Capillary refill intact and within normal limits. Sensation to light touch intact and within normal limits. No gross deformity noted.  Intake/Output from previous day: 06/05 0701 - 06/06 0700 In: 240 [P.O.:240] Out: 725 [Urine:725] Intake/Output this shift: Total I/O In: 360 [P.O.:360] Out: -    Recent Labs  01/25/15 1407 01/26/15 0421  HGB 12.7 10.6*    Recent Labs  01/25/15 1407 01/26/15 0421  WBC 11.8* 7.8  RBC 3.79* 3.12*  HCT 38.5 31.8*  PLT 102* 84*    Recent Labs  01/26/15 0421 01/27/15 0353  NA 137 137  K 3.8 4.1  CL 103 103  CO2 27 28  BUN 30* 22*  CREATININE 1.75* 1.41*  GLUCOSE 155* 241*  CALCIUM 7.9* 8.1*    Recent Labs  01/27/15 0353 01/28/15 0406  INR 1.87 2.51    Neurologically intact  Assessment/Plan:  Clear to transfer to rehabilitation when medically stable Case management to assist with discharge planning Physical therapy today Bowel movement today Plan to discharge later today. We'll need [facility] 10 days. Sooner if any problems Brace to right shoulder is to be on at all times. Also continue wearing sling.   WOLFE,JON  R. 01/28/2015, 11:10 AM

## 2015-01-30 DIAGNOSIS — I4891 Unspecified atrial fibrillation: Secondary | ICD-10-CM | POA: Diagnosis not present

## 2015-01-30 DIAGNOSIS — N189 Chronic kidney disease, unspecified: Secondary | ICD-10-CM | POA: Diagnosis not present

## 2015-01-30 LAB — BASIC METABOLIC PANEL
ANION GAP: 8 (ref 5–15)
BUN: 33 mg/dL — AB (ref 6–20)
CHLORIDE: 101 mmol/L (ref 101–111)
CO2: 26 mmol/L (ref 22–32)
Calcium: 8 mg/dL — ABNORMAL LOW (ref 8.9–10.3)
Creatinine, Ser: 1.94 mg/dL — ABNORMAL HIGH (ref 0.44–1.00)
GFR calc Af Amer: 28 mL/min — ABNORMAL LOW (ref >60)
GFR, EST NON AFRICAN AMERICAN: 24 mL/min — AB (ref >60)
Glucose, Bld: 159 mg/dL — ABNORMAL HIGH (ref 65–99)
Potassium: 4.1 mmol/L (ref 3.5–5.1)
SODIUM: 135 mmol/L (ref 135–145)

## 2015-01-30 LAB — CULTURE, BLOOD (ROUTINE X 2)
Culture: NO GROWTH
Culture: NO GROWTH

## 2015-01-30 LAB — CBC
HEMATOCRIT: 27.1 % — AB (ref 35.0–47.0)
Hemoglobin: 9 g/dL — ABNORMAL LOW (ref 12.0–16.0)
MCH: 33.8 pg (ref 26.0–34.0)
MCHC: 33.2 g/dL (ref 32.0–36.0)
MCV: 101.7 fL — ABNORMAL HIGH (ref 80.0–100.0)
Platelets: 108 10*3/uL — ABNORMAL LOW (ref 150–440)
RBC: 2.66 MIL/uL — AB (ref 3.80–5.20)
RDW: 16.6 % — AB (ref 11.5–14.5)
WBC: 6.2 10*3/uL (ref 3.6–11.0)

## 2015-01-30 LAB — PROTIME-INR
INR: 2.5
Prothrombin Time: 27.1 seconds — ABNORMAL HIGH (ref 11.4–15.0)

## 2015-02-04 ENCOUNTER — Encounter: Payer: Self-pay | Admitting: Podiatry

## 2015-02-04 ENCOUNTER — Ambulatory Visit (INDEPENDENT_AMBULATORY_CARE_PROVIDER_SITE_OTHER): Payer: Medicare Other | Admitting: Podiatry

## 2015-02-04 DIAGNOSIS — B351 Tinea unguium: Secondary | ICD-10-CM | POA: Diagnosis not present

## 2015-02-04 DIAGNOSIS — M79676 Pain in unspecified toe(s): Secondary | ICD-10-CM

## 2015-02-04 DIAGNOSIS — L97521 Non-pressure chronic ulcer of other part of left foot limited to breakdown of skin: Secondary | ICD-10-CM

## 2015-02-04 NOTE — Progress Notes (Signed)
She presents today with chief complaint of painful elongated toenails she has recently taken another fall and she is also concerned about third toe of the left foot.  Objective: Pulses remain palpable. Hammertoe deformities bilateral. Result in reactive hyperkeratosis and superficial ulceration third digit left foot. This appears to be clean and healed at this point. However the nails are thick yellow dystrophic and mycotic and no other ulcerations are noted.  Assessment: Diabetes mellitus with a history of ulceration third digit left foot. Pain elicited onychomycosis bilateral.  Plan: Debridement of nails 1 through 5 bilateral.

## 2015-02-11 LAB — GLUCOSE, CAPILLARY: GLUCOSE-CAPILLARY: 189 mg/dL — AB (ref 65–99)

## 2015-02-19 DIAGNOSIS — I4891 Unspecified atrial fibrillation: Secondary | ICD-10-CM | POA: Diagnosis not present

## 2015-02-19 LAB — CBC WITH DIFFERENTIAL/PLATELET
Basophils Absolute: 0.1 10*3/uL (ref 0–0.1)
Basophils Relative: 1 %
Eosinophils Absolute: 0.2 10*3/uL (ref 0–0.7)
Eosinophils Relative: 5 %
HCT: 34.6 % — ABNORMAL LOW (ref 35.0–47.0)
HEMOGLOBIN: 11.6 g/dL — AB (ref 12.0–16.0)
LYMPHS ABS: 1.4 10*3/uL (ref 1.0–3.6)
Lymphocytes Relative: 28 %
MCH: 34.5 pg — ABNORMAL HIGH (ref 26.0–34.0)
MCHC: 33.4 g/dL (ref 32.0–36.0)
MCV: 103.3 fL — ABNORMAL HIGH (ref 80.0–100.0)
Monocytes Absolute: 0.3 10*3/uL (ref 0.2–0.9)
Monocytes Relative: 6 %
NEUTROS ABS: 2.9 10*3/uL (ref 1.4–6.5)
NEUTROS PCT: 60 %
PLATELETS: 107 10*3/uL — AB (ref 150–440)
RBC: 3.35 MIL/uL — ABNORMAL LOW (ref 3.80–5.20)
RDW: 16.9 % — ABNORMAL HIGH (ref 11.5–14.5)
WBC: 4.9 10*3/uL (ref 3.6–11.0)

## 2015-02-19 LAB — PROTIME-INR
INR: 1.44
Prothrombin Time: 17.7 seconds — ABNORMAL HIGH (ref 11.4–15.0)

## 2015-02-22 ENCOUNTER — Encounter
Admission: RE | Admit: 2015-02-22 | Discharge: 2015-02-22 | Disposition: A | Discharge status: Home / Self Care | Payer: Medicare Other | Source: Ambulatory Visit | Attending: Internal Medicine | Admitting: Internal Medicine

## 2015-02-22 DIAGNOSIS — I4891 Unspecified atrial fibrillation: Secondary | ICD-10-CM | POA: Insufficient documentation

## 2015-02-22 DIAGNOSIS — N189 Chronic kidney disease, unspecified: Secondary | ICD-10-CM | POA: Insufficient documentation

## 2015-02-24 ENCOUNTER — Other Ambulatory Visit
Admission: RE | Admit: 2015-02-24 | Discharge: 2015-02-24 | Disposition: A | Discharge status: Home / Self Care | Payer: Medicare Other | Source: Other Acute Inpatient Hospital | Attending: Internal Medicine | Admitting: Internal Medicine

## 2015-02-24 DIAGNOSIS — R309 Painful micturition, unspecified: Secondary | ICD-10-CM | POA: Insufficient documentation

## 2015-02-24 LAB — URINALYSIS COMPLETE WITH MICROSCOPIC (ARMC ONLY)
BILIRUBIN URINE: NEGATIVE
Glucose, UA: NEGATIVE mg/dL
Ketones, ur: NEGATIVE mg/dL
Nitrite: NEGATIVE
PROTEIN: NEGATIVE mg/dL
Specific Gravity, Urine: 1.011 (ref 1.005–1.030)
pH: 6 (ref 5.0–8.0)

## 2015-02-25 LAB — URINE CULTURE

## 2015-02-26 DIAGNOSIS — I4891 Unspecified atrial fibrillation: Secondary | ICD-10-CM | POA: Diagnosis present

## 2015-02-26 DIAGNOSIS — N189 Chronic kidney disease, unspecified: Secondary | ICD-10-CM | POA: Diagnosis not present

## 2015-02-26 LAB — PROTIME-INR
INR: 1.79
Prothrombin Time: 21 seconds — ABNORMAL HIGH (ref 11.4–15.0)

## 2015-03-07 DIAGNOSIS — N183 Chronic kidney disease, stage 3 unspecified: Secondary | ICD-10-CM | POA: Insufficient documentation

## 2015-03-25 ENCOUNTER — Ambulatory Visit (INDEPENDENT_AMBULATORY_CARE_PROVIDER_SITE_OTHER): Payer: Medicare Other | Admitting: Podiatry

## 2015-03-25 DIAGNOSIS — M79676 Pain in unspecified toe(s): Secondary | ICD-10-CM

## 2015-03-25 DIAGNOSIS — L97521 Non-pressure chronic ulcer of other part of left foot limited to breakdown of skin: Secondary | ICD-10-CM

## 2015-03-25 DIAGNOSIS — B351 Tinea unguium: Secondary | ICD-10-CM | POA: Diagnosis not present

## 2015-03-25 DIAGNOSIS — L89891 Pressure ulcer of other site, stage 1: Secondary | ICD-10-CM

## 2015-03-25 DIAGNOSIS — M79673 Pain in unspecified foot: Secondary | ICD-10-CM

## 2015-03-25 NOTE — Progress Notes (Signed)
She presents today with chief complaint of painful toenails bilateral also complaining of superficial ulceration to the third digit of the left foot and skin breakdown to the posterior lateral heels as well as skin breakdown to the Achilles area bilateral. She denies fever chills nausea vomiting muscle aches and pains.  Objective: Vital signs are stable she's alert and oriented 3 pulses remain palpable bilateral. Venous insufficiency is noted to the bilateral lower extremities. Rigid hammertoe deformities are also noted with distal clavus and a superficial ulceration third digit left foot. She also has superficial skin breakdown to the Achilles area of the bilateral heels she has early pre-ulcerative lesions to the lateral aspects and posterior lateral aspects of the bilateral heels. At this point these do not demonstrate erythema or signs of infection.  Assessment: Pain and limp secondary to onychomycosis bilateral ulceration third digit left foot and pre-ulcerative lesions bilateral heels.  Plan: Discussed etiology pathology conservative versus surgical therapies. We discussed appropriate shoe gear stretching exercises ice therapy shoe modifications. I debrided all reactive hyperkeratotic tissues and debrided all nails bilaterally. I suggested soaking in Epsom salts and warm water and applying a light dressing to the posterior Achilles area to prevent shoe gear irritation. Also suggested wound pillows which she states she RE has a home. I will follow up with her at her next scheduled appointment.

## 2015-03-26 ENCOUNTER — Telehealth: Payer: Self-pay | Admitting: *Deleted

## 2015-03-26 NOTE — Telephone Encounter (Signed)
Ms. Haley Hill - Occupational Therapist states pt does not know when to take the dressing off her heel and she would like to know if she can soak the foot.  Please contact the pt with the instructions.

## 2015-03-27 NOTE — Telephone Encounter (Signed)
Called Haley Hill and L/M-heels were wrapped to prevent rubbing of shoes and okay to soak.

## 2015-04-29 ENCOUNTER — Inpatient Hospital Stay
Admission: EM | Admit: 2015-04-29 | Discharge: 2015-05-02 | DRG: 193 | Disposition: A | Discharge status: Skilled Nursing Facility | Payer: Medicare Other | Attending: Internal Medicine | Admitting: Internal Medicine

## 2015-04-29 ENCOUNTER — Emergency Department: Payer: Medicare Other

## 2015-04-29 ENCOUNTER — Encounter: Payer: Self-pay | Admitting: *Deleted

## 2015-04-29 DIAGNOSIS — I4892 Unspecified atrial flutter: Secondary | ICD-10-CM | POA: Diagnosis present

## 2015-04-29 DIAGNOSIS — J9601 Acute respiratory failure with hypoxia: Secondary | ICD-10-CM | POA: Diagnosis present

## 2015-04-29 DIAGNOSIS — I129 Hypertensive chronic kidney disease with stage 1 through stage 4 chronic kidney disease, or unspecified chronic kidney disease: Secondary | ICD-10-CM | POA: Diagnosis present

## 2015-04-29 DIAGNOSIS — I482 Chronic atrial fibrillation: Secondary | ICD-10-CM | POA: Diagnosis present

## 2015-04-29 DIAGNOSIS — E1122 Type 2 diabetes mellitus with diabetic chronic kidney disease: Secondary | ICD-10-CM | POA: Diagnosis present

## 2015-04-29 DIAGNOSIS — E876 Hypokalemia: Secondary | ICD-10-CM | POA: Diagnosis present

## 2015-04-29 DIAGNOSIS — N183 Chronic kidney disease, stage 3 (moderate): Secondary | ICD-10-CM | POA: Diagnosis present

## 2015-04-29 DIAGNOSIS — E860 Dehydration: Secondary | ICD-10-CM | POA: Diagnosis present

## 2015-04-29 DIAGNOSIS — Z7901 Long term (current) use of anticoagulants: Secondary | ICD-10-CM

## 2015-04-29 DIAGNOSIS — Z79899 Other long term (current) drug therapy: Secondary | ICD-10-CM

## 2015-04-29 DIAGNOSIS — E039 Hypothyroidism, unspecified: Secondary | ICD-10-CM | POA: Diagnosis present

## 2015-04-29 DIAGNOSIS — R0902 Hypoxemia: Secondary | ICD-10-CM | POA: Diagnosis present

## 2015-04-29 DIAGNOSIS — N179 Acute kidney failure, unspecified: Secondary | ICD-10-CM | POA: Diagnosis present

## 2015-04-29 DIAGNOSIS — J189 Pneumonia, unspecified organism: Secondary | ICD-10-CM | POA: Diagnosis present

## 2015-04-29 DIAGNOSIS — I2510 Atherosclerotic heart disease of native coronary artery without angina pectoris: Secondary | ICD-10-CM | POA: Diagnosis present

## 2015-04-29 DIAGNOSIS — N390 Urinary tract infection, site not specified: Secondary | ICD-10-CM | POA: Diagnosis present

## 2015-04-29 DIAGNOSIS — E871 Hypo-osmolality and hyponatremia: Secondary | ICD-10-CM | POA: Diagnosis present

## 2015-04-29 DIAGNOSIS — Z794 Long term (current) use of insulin: Secondary | ICD-10-CM | POA: Diagnosis not present

## 2015-04-29 DIAGNOSIS — M109 Gout, unspecified: Secondary | ICD-10-CM | POA: Diagnosis present

## 2015-04-29 HISTORY — DX: Unspecified fracture of shaft of humerus, right arm, initial encounter for closed fracture: S42.301A

## 2015-04-29 HISTORY — DX: Unspecified atrial fibrillation: I48.91

## 2015-04-29 HISTORY — DX: Dehydration: E86.0

## 2015-04-29 LAB — COMPREHENSIVE METABOLIC PANEL
ALT: 16 U/L (ref 14–54)
AST: 30 U/L (ref 15–41)
Albumin: 3.8 g/dL (ref 3.5–5.0)
Alkaline Phosphatase: 111 U/L (ref 38–126)
Anion gap: 10 (ref 5–15)
BUN: 36 mg/dL — ABNORMAL HIGH (ref 6–20)
CALCIUM: 8.5 mg/dL — AB (ref 8.9–10.3)
CO2: 29 mmol/L (ref 22–32)
Chloride: 95 mmol/L — ABNORMAL LOW (ref 101–111)
Creatinine, Ser: 2.13 mg/dL — ABNORMAL HIGH (ref 0.44–1.00)
GFR calc non Af Amer: 21 mL/min — ABNORMAL LOW (ref >60)
GFR, EST AFRICAN AMERICAN: 25 mL/min — AB (ref >60)
GLUCOSE: 216 mg/dL — AB (ref 65–99)
Potassium: 3.3 mmol/L — ABNORMAL LOW (ref 3.5–5.1)
Sodium: 134 mmol/L — ABNORMAL LOW (ref 135–145)
Total Bilirubin: 1.6 mg/dL — ABNORMAL HIGH (ref 0.3–1.2)
Total Protein: 7.1 g/dL (ref 6.5–8.1)

## 2015-04-29 LAB — URINALYSIS COMPLETE WITH MICROSCOPIC (ARMC ONLY)
Bilirubin Urine: NEGATIVE
Glucose, UA: 50 mg/dL — AB
Ketones, ur: NEGATIVE mg/dL
Nitrite: NEGATIVE
Protein, ur: 30 mg/dL — AB
Specific Gravity, Urine: 1.011 (ref 1.005–1.030)
pH: 5 (ref 5.0–8.0)

## 2015-04-29 LAB — PROTIME-INR
INR: 3.27
PROTHROMBIN TIME: 33.3 s — AB (ref 11.4–15.0)

## 2015-04-29 LAB — CBC WITH DIFFERENTIAL/PLATELET
Basophils Absolute: 0.1 10*3/uL (ref 0–0.1)
Basophils Relative: 1 %
Eosinophils Absolute: 0 10*3/uL (ref 0–0.7)
Eosinophils Relative: 0 %
HEMATOCRIT: 36.6 % (ref 35.0–47.0)
Hemoglobin: 12.3 g/dL (ref 12.0–16.0)
LYMPHS ABS: 0.8 10*3/uL — AB (ref 1.0–3.6)
MCH: 34 pg (ref 26.0–34.0)
MCHC: 33.5 g/dL (ref 32.0–36.0)
MCV: 101.4 fL — AB (ref 80.0–100.0)
Monocytes Absolute: 0.5 10*3/uL (ref 0.2–0.9)
Neutro Abs: 8.4 10*3/uL — ABNORMAL HIGH (ref 1.4–6.5)
Neutrophils Relative %: 86 %
Platelets: 79 10*3/uL — ABNORMAL LOW (ref 150–440)
RBC: 3.61 MIL/uL — ABNORMAL LOW (ref 3.80–5.20)
RDW: 17 % — ABNORMAL HIGH (ref 11.5–14.5)
WBC: 9.8 10*3/uL (ref 3.6–11.0)

## 2015-04-29 LAB — GLUCOSE, CAPILLARY
GLUCOSE-CAPILLARY: 197 mg/dL — AB (ref 65–99)
Glucose-Capillary: 238 mg/dL — ABNORMAL HIGH (ref 65–99)

## 2015-04-29 LAB — BRAIN NATRIURETIC PEPTIDE: B Natriuretic Peptide: 201 pg/mL — ABNORMAL HIGH (ref 0.0–100.0)

## 2015-04-29 LAB — TROPONIN I: Troponin I: <0.03 ng/mL

## 2015-04-29 LAB — LACTIC ACID, PLASMA
Lactic Acid, Venous: 1.2 mmol/L (ref 0.5–2.0)
Lactic Acid, Venous: 1.8 mmol/L (ref 0.5–2.0)

## 2015-04-29 MED ORDER — SENNOSIDES-DOCUSATE SODIUM 8.6-50 MG PO TABS
1.0000 | ORAL_TABLET | Freq: Every evening | ORAL | Status: DC | PRN
Start: 2015-04-29 — End: 2015-05-02

## 2015-04-29 MED ORDER — WARFARIN - PHARMACIST DOSING INPATIENT: Freq: Every day | Status: DC

## 2015-04-29 MED ORDER — ALLOPURINOL 300 MG PO TABS
300.0000 mg | ORAL_TABLET | Freq: Every day | ORAL | Status: DC
  Administered 2015-04-29 – 2015-05-01 (×3): 300 mg via ORAL
  Filled 2015-04-29 (×4): qty 1

## 2015-04-29 MED ORDER — INSULIN ASPART 100 UNIT/ML ~~LOC~~ SOLN
0.0000 [IU] | Freq: Three times a day (TID) | SUBCUTANEOUS | Status: DC
  Administered 2015-04-30: 7 [IU] via SUBCUTANEOUS
  Administered 2015-04-30: 1 [IU] via SUBCUTANEOUS
  Administered 2015-04-30 – 2015-05-02 (×4): 2 [IU] via SUBCUTANEOUS
  Administered 2015-05-02: 3 [IU] via SUBCUTANEOUS
  Filled 2015-04-29: qty 2
  Filled 2015-04-29: qty 7
  Filled 2015-04-29: qty 2
  Filled 2015-04-29: qty 1
  Filled 2015-04-29: qty 2

## 2015-04-29 MED ORDER — ALUM & MAG HYDROXIDE-SIMETH 200-200-20 MG/5ML PO SUSP
30.0000 mL | Freq: Four times a day (QID) | ORAL | Status: DC | PRN
  Administered 2015-05-01: 30 mL via ORAL
  Filled 2015-04-29: qty 30

## 2015-04-29 MED ORDER — WARFARIN SODIUM 2 MG PO TABS: 2.0000 mg | ORAL_TABLET | Freq: Every evening | ORAL | Status: DC

## 2015-04-29 MED ORDER — SODIUM CHLORIDE 0.9 % IJ SOLN
3.0000 mL | Freq: Two times a day (BID) | INTRAMUSCULAR | Status: DC
  Administered 2015-04-29 – 2015-05-02 (×5): 3 mL via INTRAVENOUS

## 2015-04-29 MED ORDER — HYDROCODONE-ACETAMINOPHEN 5-325 MG PO TABS: 1.0000 | ORAL_TABLET | ORAL | Status: DC | PRN

## 2015-04-29 MED ORDER — DEXTROSE 5 % IV SOLN
500.0000 mg | INTRAVENOUS | Status: DC
  Administered 2015-04-30 – 2015-05-01 (×2): 500 mg via INTRAVENOUS
  Filled 2015-04-29 (×3): qty 500

## 2015-04-29 MED ORDER — DEXTROSE 5 % IV SOLN
1.0000 g | INTRAVENOUS | Status: DC
  Administered 2015-04-30 – 2015-05-01 (×2): 1 g via INTRAVENOUS
  Filled 2015-04-29 (×3): qty 10

## 2015-04-29 MED ORDER — ACETAMINOPHEN 325 MG PO TABS: 650.0000 mg | ORAL_TABLET | Freq: Four times a day (QID) | ORAL | Status: DC | PRN

## 2015-04-29 MED ORDER — POTASSIUM CHLORIDE 20 MEQ/15ML (10%) PO SOLN
40.0000 meq | Freq: Once | ORAL | Status: AC
  Administered 2015-04-29: 40 meq via ORAL
  Filled 2015-04-29: qty 30

## 2015-04-29 MED ORDER — SODIUM CHLORIDE 0.9 % IV SOLN
INTRAVENOUS | Status: DC
  Administered 2015-04-29 – 2015-04-30 (×3): via INTRAVENOUS

## 2015-04-29 MED ORDER — SODIUM CHLORIDE 0.9 % IV SOLN
Freq: Once | INTRAVENOUS | Status: AC
  Administered 2015-04-29: 16:00:00 via INTRAVENOUS

## 2015-04-29 MED ORDER — LEVOTHYROXINE SODIUM 100 MCG PO TABS
100.0000 ug | ORAL_TABLET | Freq: Every day | ORAL | Status: DC
  Administered 2015-04-30 – 2015-05-01 (×2): 100 ug via ORAL
  Filled 2015-04-29 (×2): qty 1

## 2015-04-29 MED ORDER — FUROSEMIDE 40 MG PO TABS
40.0000 mg | ORAL_TABLET | Freq: Two times a day (BID) | ORAL | Status: DC
  Administered 2015-04-29 – 2015-05-02 (×6): 40 mg via ORAL
  Filled 2015-04-29 (×6): qty 1

## 2015-04-29 MED ORDER — GLIPIZIDE 5 MG PO TABS
10.0000 mg | ORAL_TABLET | Freq: Two times a day (BID) | ORAL | Status: DC
  Administered 2015-04-30 – 2015-05-02 (×5): 10 mg via ORAL
  Filled 2015-04-29 (×5): qty 2

## 2015-04-29 MED ORDER — POTASSIUM CHLORIDE CRYS ER 20 MEQ PO TBCR
20.0000 meq | EXTENDED_RELEASE_TABLET | Freq: Every day | ORAL | Status: DC
Start: 2015-04-29 — End: 2015-05-02
  Administered 2015-04-29 – 2015-05-02 (×4): 20 meq via ORAL
  Filled 2015-04-29 (×4): qty 1

## 2015-04-29 MED ORDER — WARFARIN SODIUM 1 MG PO TABS
1.0000 mg | ORAL_TABLET | Freq: Once | ORAL | Status: AC
  Administered 2015-04-29: 1 mg via ORAL
  Filled 2015-04-29: qty 1

## 2015-04-29 MED ORDER — DOCUSATE SODIUM 100 MG PO CAPS
100.0000 mg | ORAL_CAPSULE | Freq: Two times a day (BID) | ORAL | Status: DC
  Administered 2015-04-29 – 2015-05-02 (×6): 100 mg via ORAL
  Filled 2015-04-29 (×6): qty 1

## 2015-04-29 MED ORDER — ROPINIROLE HCL 1 MG PO TABS
0.5000 mg | ORAL_TABLET | Freq: Every day | ORAL | Status: DC
  Administered 2015-04-29 – 2015-05-01 (×3): 0.5 mg via ORAL
  Filled 2015-04-29 (×3): qty 1

## 2015-04-29 MED ORDER — ONDANSETRON HCL 4 MG/2ML IJ SOLN: 4.0000 mg | Freq: Four times a day (QID) | INTRAMUSCULAR | Status: DC | PRN

## 2015-04-29 MED ORDER — WARFARIN SODIUM 1 MG PO TABS
1.0000 mg | ORAL_TABLET | Freq: Every day | ORAL | Status: DC
  Filled 2015-04-29: qty 1

## 2015-04-29 MED ORDER — GABAPENTIN 400 MG PO CAPS
800.0000 mg | ORAL_CAPSULE | Freq: Three times a day (TID) | ORAL | Status: DC
  Administered 2015-04-29 – 2015-05-02 (×9): 800 mg via ORAL
  Filled 2015-04-29 (×10): qty 2

## 2015-04-29 MED ORDER — METOPROLOL SUCCINATE ER 25 MG PO TB24
25.0000 mg | ORAL_TABLET | Freq: Every day | ORAL | Status: DC
  Administered 2015-04-29 – 2015-05-02 (×3): 25 mg via ORAL
  Filled 2015-04-29 (×4): qty 1

## 2015-04-29 MED ORDER — ACETAMINOPHEN 650 MG RE SUPP: 650.0000 mg | Freq: Four times a day (QID) | RECTAL | Status: DC | PRN

## 2015-04-29 MED ORDER — VITAMIN D 1000 UNITS PO TABS
2000.0000 [IU] | ORAL_TABLET | Freq: Every day | ORAL | Status: DC
Start: 2015-04-29 — End: 2015-05-02
  Administered 2015-04-30 – 2015-05-02 (×3): 2000 [IU] via ORAL
  Filled 2015-04-29 (×3): qty 2

## 2015-04-29 MED ORDER — FERROUS SULFATE 325 (65 FE) MG PO TABS
325.0000 mg | ORAL_TABLET | Freq: Every day | ORAL | Status: DC
  Administered 2015-04-30 – 2015-05-02 (×3): 325 mg via ORAL
  Filled 2015-04-29 (×3): qty 1

## 2015-04-29 MED ORDER — ONDANSETRON HCL 4 MG PO TABS: 4.0000 mg | ORAL_TABLET | Freq: Four times a day (QID) | ORAL | Status: DC | PRN

## 2015-04-29 MED ORDER — SIMVASTATIN 40 MG PO TABS
40.0000 mg | ORAL_TABLET | Freq: Every day | ORAL | Status: DC
  Administered 2015-04-29 – 2015-05-01 (×3): 40 mg via ORAL
  Filled 2015-04-29 (×3): qty 1

## 2015-04-29 MED ORDER — INSULIN ASPART 100 UNIT/ML ~~LOC~~ SOLN
0.0000 [IU] | Freq: Every day | SUBCUTANEOUS | Status: DC
  Filled 2015-04-29: qty 3

## 2015-04-29 MED ORDER — PANTOPRAZOLE SODIUM 40 MG PO TBEC
40.0000 mg | DELAYED_RELEASE_TABLET | Freq: Every day | ORAL | Status: DC
  Administered 2015-04-30 – 2015-05-02 (×3): 40 mg via ORAL
  Filled 2015-04-29 (×3): qty 1

## 2015-04-29 MED ORDER — DEXTROSE 5 % IV SOLN
500.0000 mg | Freq: Once | INTRAVENOUS | Status: AC
  Administered 2015-04-29: 500 mg via INTRAVENOUS
  Filled 2015-04-29: qty 500

## 2015-04-29 MED ORDER — ISOSORBIDE MONONITRATE ER 30 MG PO TB24
30.0000 mg | ORAL_TABLET | Freq: Every day | ORAL | Status: DC
  Administered 2015-04-29 – 2015-05-02 (×4): 30 mg via ORAL
  Filled 2015-04-29 (×4): qty 1

## 2015-04-29 MED ORDER — CLONIDINE HCL 0.1 MG PO TABS
0.1000 mg | ORAL_TABLET | Freq: Three times a day (TID) | ORAL | Status: DC
  Administered 2015-04-29 – 2015-05-02 (×9): 0.1 mg via ORAL
  Filled 2015-04-29 (×9): qty 1

## 2015-04-29 MED ORDER — OXYCODONE-ACETAMINOPHEN 5-325 MG PO TABS
1.0000 | ORAL_TABLET | ORAL | Status: DC | PRN
  Administered 2015-04-29: 1 via ORAL
  Filled 2015-04-29: qty 1

## 2015-04-29 MED ORDER — CEFTRIAXONE SODIUM 1 G IJ SOLR
1.0000 g | Freq: Once | INTRAMUSCULAR | Status: AC
  Administered 2015-04-29: 1 g via INTRAVENOUS
  Filled 2015-04-29: qty 10

## 2015-04-29 MED ORDER — CYANOCOBALAMIN 500 MCG PO TABS
500.0000 ug | ORAL_TABLET | Freq: Every day | ORAL | Status: DC
  Administered 2015-05-01 – 2015-05-02 (×2): 500 ug via ORAL
  Filled 2015-04-29 (×3): qty 1

## 2015-04-29 NOTE — H&P (Signed)
[facility] [place] [facility]   PATIENT NAME: Haley Hill    MR#:  [id number]  DATE OF BIRTH:  [birth year]  DATE OF ADMISSION:  04/29/2015  PRIMARY CARE PHYSICIAN: Barbette Reichmann, MD   REQUESTING/REFERRING PHYSICIAN: Dr. Darnelle Catalan  CHIEF COMPLAINT:  His fever and weakness HISTORY OF PRESENT ILLNESS:  Haley Hill  is a 74 y.o. female with a known history of chronic kidney disease stage III, diabetes, chronic atrial fibrillation on Coumadin and hypertension who presents with above complaint. Patient reports for the past few days she has been feeling weak. She went to the bathroom this morning and was very weak she fell to the floor but did not hit her head. She was unable to get up so the family called and asked for further evaluation. Upon arrival by EMS she was noted have a fever of 101.5 and was 80% on room air. She does not wear oxygen at home. She does report chills over the past few days as well. She denies dysuria, cough, shortness of breath or chest pain.  PAST MEDICAL HISTORY:   Past Medical History  Diagnosis Date  . Diabetes   . Hypertension   . Coronary disease   . Atrial flutter   . Hypothyroidism   . Right renal artery stenosis   . Vitiligo   . Peripheral neuropathy   . Renal insufficiency   . Syncope and collapse   . CKD (chronic kidney disease) stage 3, GFR 30-59 ml/min   . Gout   . Fracture of humeral shaft, right, closed   . Dehydration   . Atrial fibrillation     PAST SURGICAL HISTORY:   Past Surgical History  Procedure Laterality Date  . Breast biopsy  10/2000    negative for cancer    SOCIAL HISTORY:   Social History  Substance Use Topics  . Smoking status: Never Smoker   . Smokeless tobacco: Never Used  . Alcohol Use: No    FAMILY HISTORY:   Family History  Problem Relation Age of Onset  . Gout Other   . Stroke Other   . Arthritis Other   . Osteoporosis Other     DRUG ALLERGIES:   Allergies  Allergen  Reactions  . Ciprofloxacin Itching and Rash  . Fosamax [Alendronate Sodium] Other (See Comments)    Reaction: Ulcers in mouth and esophagus     REVIEW OF SYSTEMS:  CONSTITUTIONAL: ++fever, fatigue and weakness.  EYES: No blurred or double vision.  EARS, NOSE, AND THROAT: No tinnitus or ear pain.  RESPIRATORY: No cough, shortness of breath, wheezing or hemoptysis.  CARDIOVASCULAR: No chest pain, orthopnea, edema.  GASTROINTESTINAL: No nausea, vomiting, diarrhea or abdominal pain.  GENITOURINARY: No dysuria, hematuria.  ENDOCRINE: No polyuria, nocturia,  HEMATOLOGY: No anemia, easy bruising or bleeding SKIN: No rash or lesion. MUSCULOSKELETAL: No joint pain or arthritis.   NEUROLOGIC: No tingling, numbness,+++ weakness.  PSYCHIATRY: No anxiety or depression.   MEDICATIONS AT HOME:   Prior to Admission medications   Medication Sig Start Date End Date Taking? Authorizing Provider  allopurinol (ZYLOPRIM) 300 MG tablet Take 300 mg by mouth daily.   Yes Historical Provider, MD  Cholecalciferol (VITAMIN D) 2000 UNITS CAPS Take 2,000 Units by mouth daily.   Yes Historical Provider, MD  cloNIDine (CATAPRES) 0.1 MG tablet Take 0.1 mg by mouth every 8 (eight) hours.    Yes Historical Provider, MD  cyanocobalamin 500 MCG tablet Take 500 mcg by mouth daily.   Yes  Historical Provider, MD  docusate sodium (COLACE) 100 MG capsule Take 1 capsule (100 mg total) by mouth 2 (two) times daily. 01/28/15  Yes Vishwanath Hande, MD  ferrous sulfate 325 (65 FE) MG tablet Take 325 mg by mouth daily with breakfast.   Yes Historical Provider, MD  furosemide (LASIX) 40 MG tablet Take 1 tablet (40 mg total) by mouth 2 (two) times daily. 01/28/15  Yes Vishwanath Hande, MD  gabapentin (NEURONTIN) 800 MG tablet Take 800 mg by mouth every 8 (eight) hours.    Yes Historical Provider, MD  glipiZIDE (GLUCOTROL) 10 MG tablet Take 10 mg by mouth 2 (two) times daily before a meal.   Yes Historical Provider, MD  isosorbide  mononitrate (IMDUR) 30 MG 24 hr tablet Take 30 mg by mouth daily.    Yes Historical Provider, MD  levothyroxine (SYNTHROID, LEVOTHROID) 100 MCG tablet Take 100 mcg by mouth daily before breakfast.   Yes Historical Provider, MD  metoprolol succinate (TOPROL-XL) 50 MG 24 hr tablet Take 25 mg by mouth daily.   Yes Historical Provider, MD  omeprazole (PRILOSEC) 20 MG capsule Take 20 mg by mouth 2 (two) times daily.   Yes Historical Provider, MD  oxyCODONE-acetaminophen (PERCOCET/ROXICET) 5-325 MG per tablet Take 1-2 tablets by mouth every 4 (four) hours as needed for severe pain.   Yes Historical Provider, MD  potassium chloride SA (K-DUR,KLOR-CON) 20 MEQ tablet Take 20 mEq by mouth daily.   Yes Historical Provider, MD  rOPINIRole (REQUIP) 0.5 MG tablet Take 0.5 mg by mouth at bedtime.   Yes Historical Provider, MD  simvastatin (ZOCOR) 40 MG tablet Take 40 mg by mouth at bedtime.    Yes Historical Provider, MD  warfarin (COUMADIN) 2 MG tablet Take 2 mg by mouth every evening.   Yes Historical Provider, MD  HYDROcodone-acetaminophen (NORCO/VICODIN) 5-325 MG per tablet Take 1-2 tablets by mouth every 4 (four) hours as needed for moderate pain. Patient not taking: Reported on 04/29/2015 01/28/15   Barbette Reichmann, MD      VITAL SIGNS:  Blood pressure 136/78, pulse 84, temperature 99.7 F (37.6 C), temperature source Oral, resp. rate 12, height 5\' 3"  (1.6 m), weight 92.534 kg (204 lb), SpO2 93 %.  PHYSICAL EXAMINATION:  GENERAL:  74 y.o.-year-old patient lying in the bed with no acute distress. She appears weak EYES: Pupils equal, round, reactive to light and accommodation. No scleral icterus. Extraocular muscles intact.  HEENT: Head atraumatic, normocephalic. Oropharynx  clear.  NECK:  Supple, no jugular venous distention. No thyroid enlargement, no tenderness.  LUNGS: Normal breath sounds bilaterally, no wheezing, rales,rhonchi or crepitation. No use of accessory muscles of respiration.   CARDIOVASCULAR: S1, S2 normal. No murmurs, rubs, or gallops.  ABDOMEN: Soft, nontender, nondistended. Bowel sounds present. No organomegaly or mass.  EXTREMITIES: No pedal edema, cyanosis, or clubbing.  NEUROLOGIC: Cranial nerves II through XII are grossly intact. No focal deficits. PSYCHIATRIC: The patient is alert and oriented x 3.  SKIN: No obvious rash, lesion, or ulcer.   LABORATORY PANEL:   CBC  Recent Labs Lab 04/29/15 1254  WBC 9.8  HGB 12.3  HCT 36.6  PLT 79*   ------------------------------------------------------------------------------------------------------------------  Chemistries   Recent Labs Lab 04/29/15 1254  NA 134*  K 3.3*  CL 95*  CO2 29  GLUCOSE 216*  BUN 36*  CREATININE 2.13*  CALCIUM 8.5*  AST 30  ALT 16  ALKPHOS 111  BILITOT 1.6*   ------------------------------------------------------------------------------------------------------------------  Cardiac Enzymes  Recent Labs Lab 04/29/15  1254  TROPONINI <0.03   ------------------------------------------------------------------------------------------------------------------  RADIOLOGY:  Dg Chest 2 View  04/29/2015   CLINICAL DATA:  74 year old female with weakness and fever  EXAM: CHEST  2 VIEW  COMPARISON:  Prior chest x-ray 01/25/2015  FINDINGS: Stable cardiomegaly. Atherosclerotic calcifications again noted in the transverse aorta. Patient is status post median sternotomy with evidence of prior multivessel CABG. Stable central bronchitic change in mild interstitial prominence. No focal airspace consolidation, pleural effusion or pneumothorax. No evidence of pulmonary edema. Stable mid thoracic compression fracture.  IMPRESSION: Stable chest x-ray without evidence of acute cardiopulmonary disease.   Electronically Signed   By: Malachy Moan M.D.   On: 04/29/2015 13:52   Dg Pelvis 1-2 Views  04/29/2015   CLINICAL DATA:  Status post fall.  Pain.  EXAM: PELVIS - 1-2 VIEW  COMPARISON:   01/25/2015  FINDINGS: There is no evidence of pelvic fracture or diastasis. No pelvic bone lesions are seen. There is peripheral vascular atherosclerotic disease.  IMPRESSION: Negative.   Electronically Signed   By: Elige Ko   On: 04/29/2015 13:59   Dg Knee Complete 4 Views Right  04/29/2015   CLINICAL DATA:  Weakness preventing ambulation.  Fall.  Knee pain.  EXAM: RIGHT KNEE - COMPLETE 4+ VIEW  COMPARISON:  None.  FINDINGS: Bony demineralization.  Vascular calcification.  Articular spurring in the patella. No fracture or knee effusion is identified.  IMPRESSION: 1. No acute findings. 2. Degenerative spurring of the patella. 3. Bony demineralization. 4. Vascular calcifications.   Electronically Signed   By: Gaylyn Rong M.D.   On: 04/29/2015 14:07    EKG:  Atrial flutter heart rate is 81 she is Q waves in the anteroseptal leads  IMPRESSION AND PLAN:  This is 74 year old female with a history of chronic atrial fibrillation on Coumadin, essential hypertension and diabetes who presents with weakness and found to have fever and hypoxia along with a urinary tract infection.   1. Acute respiratory failure, hypoxia: Chest x-ray is not concerning for pneumonia. She may have an underlying pneumonia and is just dehydrated and we are unable to see the at this time. I will order chest x-ray for tomorrow. INR is pending at this time as well. If the INR is subtherapeutic we should consider VQ scan in the a.m. to evaluate for pulmonary emboli as etiology of hypoxia. I have empirically started patient on Rocephin and azithromycin for possible community-acquired pneumonia. Blood cultures were ordered in the emergency room. Oxygen should be weaned to room air is tolerated. She has no evidence of congestive heart failure and her BNP is less than 400.  2. Urinary tract infection without hematuria: I started Rocephin and azithromycin for pneumonia in the Rocephin should cover for her urinary tract infection.  Urine cultures were ordered in the emergency room which will need a follow-up on.  3. Hyponatremia: Likely secondary to dehydration. I started low dose IV fluids and we'll repeat a BMP in a.m.  4. Hypokalemia: Potassium will be repleted and checked in the a.m.  5. Chronic atrial fibrillation on Coumadin: INR is pending at this time. She will remain on metoprolol and Coumadin pending INR.  6. Diabetes type 2 with chronic kidney disease: Patient continue glipizide, ADA diet and sliding scale insulin.  7. Hyperthyroidism: Continue Synthroid  8. Essential hypertension: Continue clonidine, and/or and metoprolol.  9. CAD: Continue metoprolol, Imdur and statin.    All the records are reviewed and case discussed with ED provider. Management plans discussed  with the patient and she is in agreement.  CODE STATUS: FULL  TOTAL TIME TAKING CARE OF THIS PATIENT: 50 minutes.    Janika Jedlicka M.D on 04/29/2015 at 3:34 PM  Between 7am to 6pm - Pager - [phone] After 6pm go to [email or web address] - password [email or web address] [facility]  [place] [facility]  Office  [phone]  CC: Primary care physician; Barbette Reichmann, MD

## 2015-04-29 NOTE — ED Notes (Signed)
IV checked and is intact. NS connected to antibiotic to dilute.

## 2015-04-29 NOTE — ED Provider Notes (Signed)
[facility] Emergency Department Provider Note  ____________________________________________  Time seen: Approximately 1:10 PM  I have reviewed the triage vital signs and the nursing notes.   HISTORY  Chief Complaint Weakness and Fever    HPI Haley Hill is a 74 y.o. female patient reports she felt bad all yesterday. Today she began to get a fever and weakness and thought short of breath. Her O2 sats were 88 per EMS at home. She is not on home oxygen. She had occasional dry cough. Patient also reports she had her right knee bent underneath her while she was being transferred. Hurts somewhat now. Patient also has a lump on her low back on the right side is been there for less than a month. It's tender in that area. It is the size of a quarter it is mobile and firm. It's over the SI joint. The SI joint under the knot is tender. Patient has has had it evaluated by a doctor. I'm not sure if it getting bigger it does not seem to be from what I can tell by the history. } Past Medical History  Diagnosis Date  . Diabetes   . Hypertension   . Coronary disease   . Atrial flutter   . Hypothyroidism   . Right renal artery stenosis   . Vitiligo   . Peripheral neuropathy   . Renal insufficiency   . Syncope and collapse   . CKD (chronic kidney disease) stage 3, GFR 30-59 ml/min   . Gout   . Fracture of humeral shaft, right, closed   . Dehydration   . Atrial fibrillation     Patient Active Problem List   Diagnosis Date Noted  . ARF (acute renal failure) 01/25/2015  . Fracture of humeral shaft, right, closed 01/25/2015    Past Surgical History  Procedure Laterality Date  . Breast biopsy  10/2000    negative for cancer    Current Outpatient Rx  Name  Route  Sig  Dispense  Refill  . allopurinol (ZYLOPRIM) 300 MG tablet   Oral   Take 300 mg by mouth daily.         . Cholecalciferol (VITAMIN D) 2000 UNITS CAPS   Oral   Take 2,000 Units by mouth daily.          . cloNIDine (CATAPRES) 0.1 MG tablet   Oral   Take 0.1 mg by mouth every 8 (eight) hours.          . cyanocobalamin 500 MCG tablet   Oral   Take 500 mcg by mouth daily.         Marland Kitchen docusate sodium (COLACE) 100 MG capsule   Oral   Take 1 capsule (100 mg total) by mouth 2 (two) times daily.   10 capsule   0   . ferrous sulfate 325 (65 FE) MG tablet   Oral   Take 325 mg by mouth daily with breakfast.         . furosemide (LASIX) 40 MG tablet   Oral   Take 1 tablet (40 mg total) by mouth 2 (two) times daily.   60 tablet   3   . gabapentin (NEURONTIN) 800 MG tablet   Oral   Take 800 mg by mouth every 8 (eight) hours.          Marland Kitchen glipiZIDE (GLUCOTROL) 10 MG tablet   Oral   Take 10 mg by mouth 2 (two) times daily before a meal.         .  isosorbide mononitrate (IMDUR) 30 MG 24 hr tablet   Oral   Take 30 mg by mouth daily.          Marland Kitchen levothyroxine (SYNTHROID, LEVOTHROID) 100 MCG tablet   Oral   Take 100 mcg by mouth daily before breakfast.         . metoprolol succinate (TOPROL-XL) 50 MG 24 hr tablet   Oral   Take 25 mg by mouth daily.         Marland Kitchen omeprazole (PRILOSEC) 20 MG capsule   Oral   Take 20 mg by mouth 2 (two) times daily.         Marland Kitchen oxyCODONE-acetaminophen (PERCOCET/ROXICET) 5-325 MG per tablet   Oral   Take 1-2 tablets by mouth every 4 (four) hours as needed for severe pain.         . potassium chloride SA (K-DUR,KLOR-CON) 20 MEQ tablet   Oral   Take 20 mEq by mouth daily.         Marland Kitchen rOPINIRole (REQUIP) 0.5 MG tablet   Oral   Take 0.5 mg by mouth at bedtime.         . simvastatin (ZOCOR) 40 MG tablet   Oral   Take 40 mg by mouth at bedtime.          Marland Kitchen warfarin (COUMADIN) 2 MG tablet   Oral   Take 2 mg by mouth every evening.         Marland Kitchen HYDROcodone-acetaminophen (NORCO/VICODIN) 5-325 MG per tablet   Oral   Take 1-2 tablets by mouth every 4 (four) hours as needed for moderate pain. Patient not taking: Reported on  04/29/2015   30 tablet   0     Allergies Ciprofloxacin and Fosamax  Family History  Problem Relation Age of Onset  . Gout Other   . Stroke Other   . Arthritis Other   . Osteoporosis Other     Social History Social History  Substance Use Topics  . Smoking status: Never Smoker   . Smokeless tobacco: Never Used  . Alcohol Use: No    Review of Systems Constitutional:fever/chills Eyes: No visual changes. ENT: No sore throat. Cardiovascular: Denies chest pain. Respiratory:  shortness of breath. Gastrointestinal: No abdominal pain.  No nausea, no vomiting.  No diarrhea.  No constipation. Genitourinary: Negative for dysuria. Musculoskeletal: Negative for back pain. Except for as noted in the history of present illness Skin: Negative for rash. Neurological: Negative for headaches, focal weakness or numbness.  10-point ROS otherwise negative.  ____________________________________________   PHYSICAL EXAM:  VITAL SIGNS: ED Triage Vitals  Enc Vitals Group     BP 04/29/15 1251 136/78 mmHg     Pulse Rate 04/29/15 1251 84     Resp 04/29/15 1251 12     Temp 04/29/15 1251 99.7 F (37.6 C)     Temp Source 04/29/15 1251 Oral     SpO2 04/29/15 1246 89 %     Weight 04/29/15 1251 204 lb (92.534 kg)     Height 04/29/15 1251 5\' 3"  (1.6 m)     Head Cir --      Peak Flow --      Pain Score 04/29/15 1253 6     Pain Loc --      Pain Edu? --      Excl. in GC? --     Constitutional: Alert and oriented. Well appearing and in no acute distress. Eyes: Conjunctivae are normal. PERRL. EOMI. Head: Atraumatic. Nose: No  congestion/rhinnorhea. Mouth/Throat: Mucous membranes are moist.  Oropharynx non-erythematous. Neck: No stridor. Cardiovascular: Normal rate, regular rhythm. Grossly normal heart sounds.  Good peripheral circulation. Respiratory: Normal respiratory effort.  No retractions. Lungs CTAB. Gastrointestinal: Soft and nontender. No distention. No abdominal bruits. No CVA  tenderness. Musculoskeletal: No lower extremity tenderness nor edema.  No joint effusions. Neurologic:  Normal speech and language. No gross focal neurologic deficits are appreciated. No gait instability. Skin:  Skin is warm, dry and intact. No rash noted. Psychiatric: Mood and affect are normal. Speech and behavior are normal.  ____________________________________________   LABS (all labs ordered are listed, but only abnormal results are displayed)  Labs Reviewed  COMPREHENSIVE METABOLIC PANEL - Abnormal; Notable for the following:    Sodium 134 (*)    Potassium 3.3 (*)    Chloride 95 (*)    Glucose, Bld 216 (*)    BUN 36 (*)    Creatinine, Ser 2.13 (*)    Calcium 8.5 (*)    Total Bilirubin 1.6 (*)    GFR calc non Af Amer 21 (*)    GFR calc Af Amer 25 (*)    All other components within normal limits  BRAIN NATRIURETIC PEPTIDE - Abnormal; Notable for the following:    B Natriuretic Peptide 201.0 (*)    All other components within normal limits  CBC WITH DIFFERENTIAL/PLATELET - Abnormal; Notable for the following:    RBC 3.61 (*)    MCV 101.4 (*)    RDW 17.0 (*)    Platelets 79 (*)    Neutro Abs 8.4 (*)    Lymphs Abs 0.8 (*)    All other components within normal limits  URINALYSIS COMPLETEWITH MICROSCOPIC ([facility] ONLY) - Abnormal; Notable for the following:    Color, Urine YELLOW (*)    APPearance HAZY (*)    Glucose, UA 50 (*)    Hgb urine dipstick 3+ (*)    Protein, ur 30 (*)    Leukocytes, UA 2+ (*)    Bacteria, UA RARE (*)    Squamous Epithelial / LPF 0-5 (*)    All other components within normal limits  URINE CULTURE  CULTURE, BLOOD (ROUTINE X 2)  CULTURE, BLOOD (ROUTINE X 2)  TROPONIN I  LACTIC ACID, PLASMA  LACTIC ACID, PLASMA  PROTIME-INR   ____________________________________________  EKG  EKG read and interpreted by me as atrial flutter at a rate of 81 normal axis some decreased R-wave progress in the leads which may be due to lead  placement. ____________________________________________  RADIOLOGY  Radiologist does not see a new infiltrate. Despite this I believe the patient probably has pneumonia patient may be slightly dehydrated. She has a fever and hypoxia little bit of a dry cough and hypoxia is worse with exertion. There is no sign of congestive failure clinically. There is also no swelling or pain in the leg. She has chronic vascular stasis changes in both legs however ____________________________________________   PROCEDURES    ____________________________________________   INITIAL IMPRESSION / ASSESSMENT AND PLAN / ED COURSE  Pertinent labs & imaging results that were available during my care of the patient were reviewed by me and considered in my medical decision making (see chart for details).   ____________________________________________   FINAL CLINICAL IMPRESSION(S) / ED DIAGNOSES  Final diagnoses:  Community acquired pneumonia      Arnaldo Natal, MD 04/29/15 [phone]

## 2015-04-29 NOTE — ED Notes (Signed)
Pt attempted to stand with RN adn MD at bedside. Pt needed a large amount of assistance and became SOB with the excersion of standing. Pt was not asked to take steps due to increased SOB.

## 2015-04-29 NOTE — ED Notes (Signed)
Pharmacy called regarding potassium, will send to ED.  

## 2015-04-29 NOTE — ED Notes (Signed)
Pt arrived via EMS from home reporting weakness and a fever beginning yesterday. EMS reports pt usually walks around the house without difficulty but this morning pt reported feeling too weak too walk around. Pt attempted to ambulate to bathroom and fell. Pt denies pain after fall. Pt stating 88% on RA. Pt denies wearing O2 at home. Denies difficulty breathing.

## 2015-04-29 NOTE — ED Notes (Signed)
Pt placed on bedpan

## 2015-04-29 NOTE — Progress Notes (Signed)
ANTICOAGULATION CONSULT NOTE - Initial Consult  Pharmacy Consult for WARFARIN Indication: atrial fibrillation  Allergies  Allergen Reactions  . Ciprofloxacin Itching and Rash  . Fosamax [Alendronate Sodium] Other (See Comments)    Reaction: Ulcers in mouth and esophagus    Patient Measurements: Height:  (160 cm) Weight: 204 lb (92.534 kg) IBW/kg (Calculated) : 52.4 Heparin Dosing Weight:   Vital Signs: Temp: 98.4 F (36.9 C) (09/05 1725) Temp Source: Oral (09/05 1725) BP: 126/51 mmHg (09/05 1926) Pulse Rate: 57 (09/05 1926)  Labs:  Recent Labs  04/29/15 1254 04/29/15 1656  HGB 12.3  --   HCT 36.6  --   PLT 79*  --   LABPROT  --  33.3*  INR  --  3.27  CREATININE 2.13*  --   TROPONINI <0.03  --     Estimated Creatinine Clearance: 24.3 mL/min (by C-G formula based on Cr of 2.13).   Medical History: Past Medical History  Diagnosis Date  . Diabetes   . Hypertension   . Coronary disease   . Atrial flutter   . Hypothyroidism   . Right renal artery stenosis   . Vitiligo   . Peripheral neuropathy   . Renal insufficiency   . Syncope and collapse   . CKD (chronic kidney disease) stage 3, GFR 30-59 ml/min   . Gout   . Fracture of humeral shaft, right, closed   . Dehydration   . Atrial fibrillation     Medications:  Scheduled:  . allopurinol  300 mg Oral Daily  . [START ON 04/30/2015] azithromycin  500 mg Intravenous Q24H  . [START ON 04/30/2015] cefTRIAXone (ROCEPHIN)  IV  1 g Intravenous Q24H  . cholecalciferol  2,000 Units Oral Daily  . cloNIDine  0.1 mg Oral 3 times per day  . cyanocobalamin  500 mcg Oral Daily  . docusate sodium  100 mg Oral BID  . [START ON 04/30/2015] ferrous sulfate  325 mg Oral Q breakfast  . furosemide  40 mg Oral BID  . gabapentin  800 mg Oral 3 times per day  . glipiZIDE  10 mg Oral BID AC  . insulin aspart  0-5 Units Subcutaneous QHS  . [START ON 04/30/2015] insulin aspart  0-9 Units Subcutaneous TID WC  . isosorbide  mononitrate  30 mg Oral Daily  . [START ON 04/30/2015] levothyroxine  100 mcg Oral QAC breakfast  . metoprolol succinate  25 mg Oral Daily  . pantoprazole  40 mg Oral Daily  . potassium chloride SA  20 mEq Oral Daily  . rOPINIRole  0.5 mg Oral QHS  . simvastatin  40 mg Oral QHS  . sodium chloride  3 mL Intravenous Q12H  . warfarin  1 mg Oral Once  . [START ON 04/30/2015] warfarin  1 mg Oral q1800  . [START ON 04/30/2015] Warfarin - Pharmacist Dosing Inpatient   Does not apply q1800    Assessment: 74 y.o. female with a known history of chronic kidney disease stage III, diabetes, chronic atrial fibrillation on Coumadin. Patient on Ceftriaxone/Azithromycin for possible CAP. Patient on Warfarin 2 mg daily at home with last dose noted for 9/4.  Goal of Therapy:  INR 2-3 Monitor platelets by anticoagulation protocol: Yes   Plan:  9/5:  INR 3.27   Will order lower dose of Warfarin 1 mg daily and recheck INr in am. Patient on antibiotics.  Bari Mantis PharmD Clinical Pharmacist 04/29/2015  7:35 PM

## 2015-04-30 ENCOUNTER — Inpatient Hospital Stay: Payer: Medicare Other

## 2015-04-30 LAB — BASIC METABOLIC PANEL
Anion gap: 5 (ref 5–15)
BUN: 32 mg/dL — ABNORMAL HIGH (ref 6–20)
CALCIUM: 8.3 mg/dL — AB (ref 8.9–10.3)
CO2: 31 mmol/L (ref 22–32)
CREATININE: 1.94 mg/dL — AB (ref 0.44–1.00)
Chloride: 104 mmol/L (ref 101–111)
GFR, EST AFRICAN AMERICAN: 28 mL/min — AB (ref >60)
GFR, EST NON AFRICAN AMERICAN: 24 mL/min — AB (ref >60)
GLUCOSE: 152 mg/dL — AB (ref 65–99)
Potassium: 4.1 mmol/L (ref 3.5–5.1)
Sodium: 140 mmol/L (ref 135–145)

## 2015-04-30 LAB — PROTIME-INR
INR: 3.47
PROTHROMBIN TIME: 34.9 s — AB (ref 11.4–15.0)

## 2015-04-30 LAB — GLUCOSE, CAPILLARY
GLUCOSE-CAPILLARY: 126 mg/dL — AB (ref 65–99)
Glucose-Capillary: 154 mg/dL — ABNORMAL HIGH (ref 65–99)
Glucose-Capillary: 324 mg/dL — ABNORMAL HIGH (ref 65–99)
Glucose-Capillary: 150 mg/dL — ABNORMAL HIGH (ref 65–99)

## 2015-04-30 MED ORDER — WARFARIN SODIUM 1 MG PO TABS
1.0000 mg | ORAL_TABLET | Freq: Every day | ORAL | Status: DC
  Administered 2015-05-01: 1 mg via ORAL
  Filled 2015-04-30: qty 1

## 2015-04-30 NOTE — Progress Notes (Signed)
ANTICOAGULATION CONSULT NOTE - Initial Consult  Pharmacy Consult for WARFARIN Indication: atrial fibrillation  Allergies  Allergen Reactions  . Ciprofloxacin Itching and Rash  . Fosamax [Alendronate Sodium] Other (See Comments)    Reaction: Ulcers in mouth and esophagus    Patient Measurements: Height:  (160 cm) Weight: 204 lb (92.534 kg) IBW/kg (Calculated) : 52.4 Heparin Dosing Weight:   Vital Signs: Temp: 98.6 F (37 C) (09/06 0332) Temp Source: Oral (09/06 0332) BP: 95/72 mmHg (09/06 0332) Pulse Rate: 68 (09/06 0332)  Labs:  Recent Labs  04/29/15 1254 04/29/15 1656 04/30/15 0506  HGB 12.3  --   --   HCT 36.6  --   --   PLT 79*  --   --   LABPROT  --  33.3* 34.9*  INR  --  3.27 3.47  CREATININE 2.13*  --  1.94*  TROPONINI <0.03  --   --     Estimated Creatinine Clearance: 26.6 mL/min (by C-G formula based on Cr of 1.94).   Medical History: Past Medical History  Diagnosis Date  . Diabetes   . Hypertension   . Coronary disease   . Atrial flutter   . Hypothyroidism   . Right renal artery stenosis   . Vitiligo   . Peripheral neuropathy   . Renal insufficiency   . Syncope and collapse   . CKD (chronic kidney disease) stage 3, GFR 30-59 ml/min   . Gout   . Fracture of humeral shaft, right, closed   . Dehydration   . Atrial fibrillation     Medications:  Scheduled:  . allopurinol  300 mg Oral Daily  . azithromycin  500 mg Intravenous Q24H  . cefTRIAXone (ROCEPHIN)  IV  1 g Intravenous Q24H  . cholecalciferol  2,000 Units Oral Daily  . cloNIDine  0.1 mg Oral 3 times per day  . cyanocobalamin  500 mcg Oral Daily  . docusate sodium  100 mg Oral BID  . ferrous sulfate  325 mg Oral Q breakfast  . furosemide  40 mg Oral BID  . gabapentin  800 mg Oral 3 times per day  . glipiZIDE  10 mg Oral BID AC  . insulin aspart  0-5 Units Subcutaneous QHS  . insulin aspart  0-9 Units Subcutaneous TID WC  . isosorbide mononitrate  30 mg Oral Daily  .  levothyroxine  100 mcg Oral QAC breakfast  . metoprolol succinate  25 mg Oral Daily  . pantoprazole  40 mg Oral Daily  . potassium chloride SA  20 mEq Oral Daily  . rOPINIRole  0.5 mg Oral QHS  . simvastatin  40 mg Oral QHS  . sodium chloride  3 mL Intravenous Q12H  . warfarin  1 mg Oral q1800  . Warfarin - Pharmacist Dosing Inpatient   Does not apply q1800    Assessment: 74 y.o. female with a known history of chronic kidney disease stage III, diabetes, chronic atrial fibrillation on Coumadin. Patient on Ceftriaxone/Azithromycin for possible CAP. Patient on Warfarin 2 mg daily at home with last dose noted for 9/4.  Goal of Therapy:  INR 2-3 Monitor platelets by anticoagulation protocol: Yes   Plan:  9/5:  INR 3.27   Will order lower dose of Warfarin 1 mg daily and recheck INr in am. Patient on antibiotics.  9/6 AM INR 3.47. Hold today's dose and recheck in AM.   Bari Mantis PharmD Clinical Pharmacist 04/30/2015  6:57 AM

## 2015-04-30 NOTE — Progress Notes (Signed)
Patient active with [facility] skilled nursing and occupational therapy. Also has a home health aide coming out.

## 2015-04-30 NOTE — Evaluation (Signed)
Physical Therapy Evaluation Patient Details Name: Haley Hill MRN: [id number] DOB: September 05, 1940 Today's Date: 04/30/2015   History of Present Illness  Pt is a 74 yo female with noted weakness over the past few days. She also had a fall due to the weakness and was not able to get up from the floor.   Clinical Impression  Pt presents with HTN, CAD, DM, and CKD. Examination reveals that pt performs bed mobility at mod assist and transfers at max assist. Pt has LOB posteriorly and needs assist to maintain midline. Ambulation not safe to attempt at this time. Pt also demonstrates generalized weakness with increased time to complete mobility tasks. All mobility performed on 2L O2 [place] with O2 sats never falling below 95 during mobility. Pt O2 sats at rest on room air were 81%, therefore O2 [place] used with mobility. Pt will benefit from skilled PT in order to address her strength, balance and endurance deficits.     Follow Up Recommendations SNF;Supervision/Assistance - 24 hour    Equipment Recommendations  Rolling walker with 5" wheels    Recommendations for Other Services       Precautions / Restrictions Precautions Precautions: Fall Restrictions Weight Bearing Restrictions: Yes      Mobility  Bed Mobility Overal bed mobility: Needs Assistance Bed Mobility: Supine to Sit     Supine to sit: Mod assist     General bed mobility comments: Pt needs assist for trunk support and LEs. Needs cues for hand placement  Transfers Overall transfer level: Needs assistance Equipment used: Rolling walker (2 wheeled) Transfers: Sit to/from Stand Sit to Stand: Max assist         General transfer comment: Pt needs assist to get into standing as well as cueing for hand placement. Once standing pt needs cue to lean forward due to her posterior lean. Pt can correct but not maintain this  Ambulation/Gait Ambulation/Gait assistance:  (Not appropriate due to standing balance)              Stairs            Wheelchair Mobility    Modified Rankin (Stroke Patients Only)       Balance Overall balance assessment: History of Falls                                           Pertinent Vitals/Pain Pain Assessment: 0-10 Pain Score: 5  Pain Location: R knee Pain Intervention(s): Limited activity within patient's tolerance;Monitored during session    Home Living Family/patient expects to be discharged to:: Private residence Living Arrangements: Children Available Help at Discharge: Family Type of Home: House Home Access: Stairs to enter;Ramped entrance Entrance Stairs-Rails: Right;Left;Can reach both Entrance Stairs-Number of Steps: 5 in front with B rails Home Layout: One level Home Equipment: Bedside commode Additional Comments: Pt states that she can have 24 hr assisstance if needed     Prior Function Level of Independence: Independent         Comments: Pt states she was independent with all mobility and ADLs. She states that she has a cane but rarely uses it.      Hand Dominance        Extremity/Trunk Assessment   Upper Extremity Assessment: Generalized weakness           Lower Extremity Assessment: Generalized weakness  Communication   Communication: No difficulties  Cognition Arousal/Alertness: Awake/alert Behavior During Therapy: WFL for tasks assessed/performed Overall Cognitive Status: Within Functional Limits for tasks assessed                      General Comments      Exercises Other Exercises Other Exercises: Pt performed bilateral LE therex x 10 reps at supervision for proper technique. Exercises included: ankle pumps, quad sets, glute sets, SLR, and hip abd/add       Assessment/Plan    PT Assessment Patient needs continued PT services  PT Diagnosis Difficulty walking;Abnormality of gait;Generalized weakness   PT Problem List Decreased strength;Decreased range of motion;Decreased activity  tolerance;Decreased balance;Decreased mobility;Cardiopulmonary status limiting activity  PT Treatment Interventions DME instruction;Gait training;Stair training;Functional mobility training;Therapeutic activities;Therapeutic exercise;Balance training;Neuromuscular re-education;Patient/family education;Manual techniques   PT Goals (Current goals can be found in the Care Plan section) Acute Rehab PT Goals Patient Stated Goal: to go where she's safe PT Goal Formulation: With patient Time For Goal Achievement: 05/14/15 Potential to Achieve Goals: Good    Frequency Min 2X/week   Barriers to discharge        Co-evaluation               End of Session Equipment Utilized During Treatment: Gait belt;Oxygen Activity Tolerance: Patient tolerated treatment well;Patient limited by fatigue Patient left: in bed;with bed alarm set;with call bell/phone within reach;with restraints reapplied Nurse Communication: Mobility status         Time: 1122-1150 PT Time Calculation (min) (ACUTE ONLY): 28 min   Charges:         PT G CodesBenna Dunks 05/03/15, 1:20 PM  Benna Dunks, SPT. [phone]

## 2015-04-30 NOTE — Progress Notes (Signed)
PROGRESS NOTE  Haley Hill ZOX:[id number] DOB: [birth year] DOA: 04/29/2015 PCP: Barbette Reichmann, MD  Subjective 74 y/o f with hx of Type 2 DM, CKD, Chronic A-fib, HTN admitted with fever and weakness. Pt reportedly fell and in ER was noted to be febrile and hypoxemic. CXR was negative for Pneumonia. This am still weak with low grade fever    Objective: BP 143/73 mmHg  Pulse 71  Temp(Src) 99.5 F (37.5 C) (Oral)  Resp 18  Ht  (1.6 m)  Wt 92.534 kg (204 lb)  BMI 36.15 kg/m2  SpO2 99%  Intake/Output Summary (Last 24 hours) at 04/30/15 1556 Last data filed at 04/30/15 1505  Gross per 24 hour  Intake 1858.75 ml  Output   2501 ml  Net -642.25 ml   Filed Weights   04/29/15 1251  Weight: 92.534 kg (204 lb)    Exam:  General: Elderly  female, Not in distress at rest HEENT: PERRL; OP moist without lesions. Neck: supple, trachea midline, no thyromegaly Chest: normal to palpation Lungs: clear bilaterally without retractions or wheezes Cardiovascular: RRR, no murmur, no gallop Abdomen: soft, nontender, nondistended, positive bowel sounds Extremities: no clubbing, cyanosis,  1 + edema Neuro: alert and oriented, moves all extremities Derm: no significant rashes or nodules; good skin turgor Lymph: no cervical or supraclavicular lymphadenopathy   Labs and imaging studies were reviewed*  Data Reviewed: Basic Metabolic Panel:  Recent Labs Lab 04/29/15 1254 04/30/15 0506  NA 134* 140  K 3.3* 4.1  CL 95* 104  CO2 29 31  GLUCOSE 216* 152*  BUN 36* 32*  CREATININE 2.13* 1.94*  CALCIUM 8.5* 8.3*   Liver Function Tests:  Recent Labs Lab 04/29/15 1254  AST 30  ALT 16  ALKPHOS 111  BILITOT 1.6*  PROT 7.1  ALBUMIN 3.8   No results for input(s): LIPASE, AMYLASE in the last 168 hours. No results for input(s): AMMONIA in the last 168 hours. CBC:  Recent Labs Lab 04/29/15 1254  WBC 9.8  NEUTROABS 8.4*  HGB 12.3  HCT 36.6  MCV 101.4*  PLT 79*    Cardiac Enzymes:    Recent Labs Lab 04/29/15 1254  TROPONINI <0.03   BNP (last 3 results)  Recent Labs  04/29/15 1254  BNP 201.0*    ProBNP (last 3 results) No results for input(s): PROBNP in the last 8760 hours.  CBG:  Recent Labs Lab 04/29/15 1732 04/29/15 2131 04/30/15 0807 04/30/15 1124  GLUCAP 238* 197* 154* 324*    Recent Results (from the past 240 hour(s))  Culture, blood (routine x 2)     Status: None (Preliminary result)   Collection Time: 04/29/15 12:54 PM  Result Value Ref Range Status   Specimen Description BLOOD LEFT FATTY CASTS  Final   Special Requests Normal  Final   Culture NO GROWTH < 24 HOURS  Final   Report Status PENDING  Incomplete  Culture, blood (routine x 2)     Status: None (Preliminary result)   Collection Time: 04/29/15 12:54 PM  Result Value Ref Range Status   Specimen Description BLOOD RIGHT RADIAL  Final   Special Requests   Final    BOTTLES DRAWN AEROBIC AND ANAEROBIC 1CC AEROBIC,1CC ANAEROBIC   Culture NO GROWTH < 24 HOURS  Final   Report Status PENDING  Incomplete  Urine culture     Status: None (Preliminary result)   Collection Time: 04/29/15  2:50 PM  Result Value Ref Range Status   Specimen Description URINE, RANDOM  Final   Special Requests Normal  Final   Culture TOO YOUNG TO READ  Final   Report Status PENDING  Incomplete     Studies: X-ray Chest Pa And Lateral  04/30/2015   CLINICAL DATA:  Pneumonia  EXAM: CHEST  2 VIEW  COMPARISON:  04/29/2015  FINDINGS: Chronic cardiomegaly. Patient is status post CABG. Stable aortic and hilar contours. Question trace posterior costophrenic sulcus pleural fluid. No pneumonia, edema, or pneumothorax. Chronic exaggerated thoracic kyphosis and mid thoracic compression fracture.  IMPRESSION: Negative for pneumonia or edema.   Electronically Signed   By: Marnee Spring M.D.   On: 04/30/2015 07:42    Scheduled Meds: . allopurinol  300 mg Oral Daily  . azithromycin  500 mg  Intravenous Q24H  . cefTRIAXone (ROCEPHIN)  IV  1 g Intravenous Q24H  . cholecalciferol  2,000 Units Oral Daily  . cloNIDine  0.1 mg Oral 3 times per day  . cyanocobalamin  500 mcg Oral Daily  . docusate sodium  100 mg Oral BID  . ferrous sulfate  325 mg Oral Q breakfast  . furosemide  40 mg Oral BID  . gabapentin  800 mg Oral 3 times per day  . glipiZIDE  10 mg Oral BID AC  . insulin aspart  0-5 Units Subcutaneous QHS  . insulin aspart  0-9 Units Subcutaneous TID WC  . isosorbide mononitrate  30 mg Oral Daily  . levothyroxine  100 mcg Oral QAC breakfast  . metoprolol succinate  25 mg Oral Daily  . pantoprazole  40 mg Oral Daily  . potassium chloride SA  20 mEq Oral Daily  . rOPINIRole  0.5 mg Oral QHS  . simvastatin  40 mg Oral QHS  . sodium chloride  3 mL Intravenous Q12H  . [START ON 05/01/2015] warfarin  1 mg Oral q1800  . Warfarin - Pharmacist Dosing Inpatient   Does not apply q1800    Continuous Infusions: . sodium chloride 75 mL/hr at 04/30/15 0813    Assessment/Plan: 1 Acute Respiratory Failure with Hypoxemia: On IV antibiotics for presumed Pneumonia 2 UTI; On IV Rocephin 3 Hyponatremia: Se Sodium improved to 140 4 Chronic A-fib.Continue Metoprolol and Coumadin 5 Hypothyroidism; On Levothyroxine 6 CKD;  Se Creat improved to 1.94 Continue to monitor  7 HTN; Stable- Continue to monitor 8 CAD: Currently chest pain free-Continue Isno 9 Type 2 DM; On Glipizide and SSI 10 Weakness; Rec PT   Code Status: Full           04/30/2015, 3:56 PM  LOS: 1 day

## 2015-05-01 LAB — CBC WITH DIFFERENTIAL/PLATELET
Basophils Absolute: 0 10*3/uL (ref 0–0.1)
Basophils Relative: 1 %
EOS ABS: 0.1 10*3/uL (ref 0–0.7)
Eosinophils Relative: 1 %
HCT: 31.6 % — ABNORMAL LOW (ref 35.0–47.0)
HEMOGLOBIN: 10.8 g/dL — AB (ref 12.0–16.0)
LYMPHS ABS: 1.3 10*3/uL (ref 1.0–3.6)
Lymphocytes Relative: 18 %
MCH: 34.9 pg — AB (ref 26.0–34.0)
MCHC: 34.3 g/dL (ref 32.0–36.0)
MCV: 101.7 fL — ABNORMAL HIGH (ref 80.0–100.0)
Monocytes Absolute: 0.6 10*3/uL (ref 0.2–0.9)
Monocytes Relative: 8 %
Neutro Abs: 5.3 10*3/uL (ref 1.4–6.5)
Platelets: 76 10*3/uL — ABNORMAL LOW (ref 150–440)
RBC: 3.1 MIL/uL — AB (ref 3.80–5.20)
RDW: 16.6 % — ABNORMAL HIGH (ref 11.5–14.5)
WBC: 7.4 10*3/uL (ref 3.6–11.0)

## 2015-05-01 LAB — GLUCOSE, CAPILLARY
GLUCOSE-CAPILLARY: 196 mg/dL — AB (ref 65–99)
Glucose-Capillary: 194 mg/dL — ABNORMAL HIGH (ref 65–99)
Glucose-Capillary: 136 mg/dL — ABNORMAL HIGH (ref 65–99)

## 2015-05-01 LAB — BASIC METABOLIC PANEL
Anion gap: 8 (ref 5–15)
BUN: 28 mg/dL — AB (ref 6–20)
CHLORIDE: 100 mmol/L — AB (ref 101–111)
CO2: 32 mmol/L (ref 22–32)
Calcium: 8.5 mg/dL — ABNORMAL LOW (ref 8.9–10.3)
Creatinine, Ser: 1.7 mg/dL — ABNORMAL HIGH (ref 0.44–1.00)
GFR calc non Af Amer: 28 mL/min — ABNORMAL LOW (ref >60)
GFR, EST AFRICAN AMERICAN: 33 mL/min — AB (ref >60)
Glucose, Bld: 74 mg/dL (ref 65–99)
POTASSIUM: 3.4 mmol/L — AB (ref 3.5–5.1)
SODIUM: 140 mmol/L (ref 135–145)

## 2015-05-01 LAB — PROTIME-INR
INR: 2.55
Prothrombin Time: 27.5 seconds — ABNORMAL HIGH (ref 11.4–15.0)

## 2015-05-01 LAB — URINE CULTURE: SPECIAL REQUESTS: NORMAL

## 2015-05-01 MED ORDER — POTASSIUM CHLORIDE CRYS ER 20 MEQ PO TBCR
20.0000 meq | EXTENDED_RELEASE_TABLET | Freq: Once | ORAL | Status: AC
Start: 2015-05-01 — End: 2015-05-01
  Administered 2015-05-01: 20 meq via ORAL
  Filled 2015-05-01: qty 1

## 2015-05-01 NOTE — Progress Notes (Signed)
Pleasant and cooperative with care. Assist x2 to bathroom. Denies pain. Reports acid reflux. PRN given, awaiting effects. No nausea, vomiting. Pt afebrile. Will continue to assess and monitor for safety.

## 2015-05-01 NOTE — Clinical Social Work Note (Signed)
Clinical Social Work Assessment  Patient Details  Name: Haley Hill MRN: [id number] Date of Birth: [birth year]  Date of referral:                  Reason for consult:                   Permission sought to share information with:    Permission granted to share information::     Name::        Agency::     Relationship::     Contact Information:     Housing/Transportation Living arrangements for the past 2 months:    Source of Information:    Patient Interpreter Needed:    Criminal Activity/Legal Involvement Pertinent to Current Situation/Hospitalization:    Significant Relationships:    Lives with:    Do you feel safe going back to the place where you live?    Need for family participation in patient care:     Care giving concerns:  PT recommending SNF    Social Worker assessment / plan:  CSW met with patient who is agreeable to SNF for rehab-  She reports that she has been to [facility] in the past and would like to go there again.  She lives with her son and his wife- she reports her son is recovering from a long hospitalization. He also has has been to SNF in the past so they are familiar with this setting and process.  Employment status:   retired   Forensic scientist:   Comptroller PT Recommendations:   SNF Information / Referral to community resources:   SNF list   Patient/Family's Response to care:  Both patient and her son understand and agree to the plans for SNF at dc- patient would like to go to [facility] if bed offered-   Patient/Family's Understanding of and Emotional Response to Diagnosis, Current Treatment, and Prognosis:  Patient is uncertain of why she is in the hospital  Aside from "having a fever". She reports the team has kept her and her family updated and they are agreeable to plan of care as well as SNF.   Emotional Assessment Appearance:    Attitude/Demeanor/Rapport:    Affect (typically observed):    Orientation:    Alcohol / Substance  use:    Psych involvement (Current and /or in the community):     Discharge Needs  Concerns to be addressed:    Readmission within the last 30 days:    Current discharge risk:    Barriers to Discharge:      Ludwig Clarks, LCSW 05/01/2015, 10:37 AM

## 2015-05-01 NOTE — Clinical Social Work Placement (Signed)
   CLINICAL SOCIAL WORK PLACEMENT  NOTE  Date:  05/01/2015  Patient Details  Name: Haley Hill MRN: [id number] Date of Birth: 15-May-1941  Clinical Social Work is seeking post-discharge placement for this patient at the Skilled  Nursing Facility level of care (*CSW will initial, date and re-position this form in  chart as items are completed):  Yes   Patient/family provided with [place] [facility] Clinical Social Work Department's list of facilities offering this level of care within the geographic area requested by the patient (or if unable, by the patient's family).  Yes   Patient/family informed of their freedom to choose among providers that offer the needed level of care, that participate in Medicare, Medicaid or managed care program needed by the patient, have an available bed and are willing to accept the patient.  Yes   Patient/family informed of [facility]'s ownership interest in [facility] and [facility], as well as of the fact that they are under no obligation to receive care at these facilities.  PASRR submitted to EDS on 05/01/15     PASRR number received on       Existing PASRR number confirmed on 05/01/15     FL2 transmitted to all facilities in geographic area requested by pt/family on 05/01/15     FL2 transmitted to all facilities within larger geographic area on       Patient informed that his/her managed care company has contracts with or will negotiate with certain facilities, including the following:            Patient/family informed of bed offers received.  Patient chooses bed at       Physician recommends and patient chooses bed at      Patient to be transferred to   on  .  Patient to be transferred to facility by       Patient family notified on   of transfer.  Name of family member notified:        PHYSICIAN Please sign FL2, Please prepare priority discharge summary, including medications, Please prepare prescriptions     Additional Comment:     _______________________________________________ Liliana Cline, LCSW 05/01/2015, 10:53 AM

## 2015-05-01 NOTE — Progress Notes (Signed)
Physical Therapy Treatment Patient Details Name: Haley Hill MRN: [id number] DOB: 29-Apr-1941 Today's Date: 05/01/2015    History of Present Illness Pt is a 74 yo female with noted weakness over the past few days. She also had a fall due to the weakness and was not able to get up from the floor.     PT Comments    Pt is progressing towards goals with increased functional mobility this date. Pt able to ambulate to recliner with +2 assist and rw. O2 removed once in recliner during there-ex with O2 sats at 99%. No SOB symptoms reported and pt left on room air. RN notified. Pt demonstrates improved endurance with there-ex.  Follow Up Recommendations  SNF;Supervision/Assistance - 24 hour     Equipment Recommendations  Rolling walker with 5" wheels    Recommendations for Other Services       Precautions / Restrictions Precautions Precautions: Fall Restrictions Weight Bearing Restrictions: Yes    Mobility  Bed Mobility Overal bed mobility: Needs Assistance Bed Mobility: Supine to Sit           General bed mobility comments: supine->sit with min assist to L side of bed with pt able to grab handrail to assist trunk. Safe technique performed and pt able to sit with cga once at EOB.  Transfers Overall transfer level: Needs assistance Equipment used: Rolling walker (2 wheeled) Transfers: Sit to/from Stand           General transfer comment: sit<>Stand with max assist and pt with uncontrolled descent back to bed. 2nd attempt with +2 and mod assist using rw. Safe technique performed with upright posture acheived. O2 donned for mobility.  Ambulation/Gait Ambulation/Gait assistance: Min assist;+2 physical assistance Ambulation Distance (Feet): 3 Feet Assistive device: Rolling walker (2 wheeled) Gait Pattern/deviations: Step-to pattern     General Gait Details: ambulated to recliner with min assist + 2 and rw. Pt with slow step to gait pattern with safe technique performed. Pt  requires cues for correct technique.   Stairs            Wheelchair Mobility    Modified Rankin (Stroke Patients Only)       Balance                                    Cognition Arousal/Alertness: Awake/alert Behavior During Therapy: WFL for tasks assessed/performed Overall Cognitive Status: Within Functional Limits for tasks assessed                      Exercises Other Exercises Other Exercises: Pt performed supine/seated ther-ex including ankle pumps, quad sets, SAQ, hip abd/add, glut sets, and seated LAQ. All ther-ex performed x 12 reps with cga and cues for correct technique.    General Comments        Pertinent Vitals/Pain Pain Assessment: No/denies pain    Home Living                      Prior Function            PT Goals (current goals can now be found in the care plan section) Acute Rehab PT Goals Patient Stated Goal: to go where she's safe PT Goal Formulation: With patient Time For Goal Achievement: 05/14/15 Potential to Achieve Goals: Good Progress towards PT goals: Progressing toward goals    Frequency  Min 2X/week    PT  Plan Current plan remains appropriate    Co-evaluation             End of Session Equipment Utilized During Treatment: Gait belt;Oxygen Activity Tolerance: Patient tolerated treatment well;Patient limited by fatigue Patient left: in chair;with chair alarm set     Time: 2956-2130 PT Time Calculation (min) (ACUTE ONLY): 25 min  Charges:  $Gait Training: 8-22 mins $Therapeutic Exercise: 8-22 mins                    G Codes:      Navid Lenzen 2015-05-10, 4:46 PM  Elizabeth Palau, PT, DPT [phone]

## 2015-05-01 NOTE — Plan of Care (Signed)
Problem: Phase I Progression Outcomes Goal: Pain controlled with appropriate interventions Outcome: Progressing Denies pain.     

## 2015-05-01 NOTE — Progress Notes (Signed)
PROGRESS NOTE  Haley Hill RUE:[id number] DOB: 12-07-1940 DOA: 04/29/2015 PCP: Barbette Reichmann, MD  Subjective 74 y/o f with hx of Type 2 DM, CKD, Chronic A-fib, HTN admitted with fever and weakness. Pt reportedly fell and in ER was noted to be febrile and hypoxemic. CXR was negative for Pneumonia. This am feels better- but still weak    Objective: BP 102/50 mmHg  Pulse 54  Temp(Src) 97.6 F (36.4 C) (Oral)  Resp 18  Ht 5\' 3"  (1.6 m)  Wt 92.534 kg (204 lb)  BMI 36.15 kg/m2  SpO2 100%  Intake/Output Summary (Last 24 hours) at 05/01/15 0825 Last data filed at 05/01/15 0758  Gross per 24 hour  Intake 2833.75 ml  Output   2300 ml  Net 533.75 ml   Filed Weights   04/29/15 1251  Weight: 92.534 kg (204 lb)    Exam:  General: Elderly  female, Not in distress at rest HEENT: PERRL; OP moist without lesions. Neck: supple, trachea midline, no thyromegaly Chest: normal to palpation Lungs: clear bilaterally without retractions or wheezes Cardiovascular: RRR, no murmur, no gallop Abdomen: soft, nontender, nondistended, positive bowel sounds Extremities: no clubbing, cyanosis,  1 + edema Neuro: alert and oriented, moves all extremities Derm: Venous stasis dermatitis on legs  Lymph: no cervical or supraclavicular lymphadenopathy   Labs and imaging studies were reviewed*  Data Reviewed: Basic Metabolic Panel:  Recent Labs Lab 04/29/15 1254 04/30/15 0506 05/01/15 0528  NA 134* 140 140  K 3.3* 4.1 3.4*  CL 95* 104 100*  CO2 29 31 32  GLUCOSE 216* 152* 74  BUN 36* 32* 28*  CREATININE 2.13* 1.94* 1.70*  CALCIUM 8.5* 8.3* 8.5*   Liver Function Tests:  Recent Labs Lab 04/29/15 1254  AST 30  ALT 16  ALKPHOS 111  BILITOT 1.6*  PROT 7.1  ALBUMIN 3.8   No results for input(s): LIPASE, AMYLASE in the last 168 hours. No results for input(s): AMMONIA in the last 168 hours. CBC:  Recent Labs Lab 04/29/15 1254 05/01/15 0528  WBC 9.8 7.4  NEUTROABS 8.4* 5.3   HGB 12.3 10.8*  HCT 36.6 31.6*  MCV 101.4* 101.7*  PLT 79* 76*   Cardiac Enzymes:    Recent Labs Lab 04/29/15 1254  TROPONINI <0.03   BNP (last 3 results)  Recent Labs  04/29/15 1254  BNP 201.0*    ProBNP (last 3 results) No results for input(s): PROBNP in the last 8760 hours.  CBG:  Recent Labs Lab 04/29/15 2131 04/30/15 0807 04/30/15 1124 04/30/15 1637 04/30/15 2042  GLUCAP 197* 154* 324* 150* 126*    Recent Results (from the past 240 hour(s))  Culture, blood (routine x 2)     Status: None (Preliminary result)   Collection Time: 04/29/15 12:54 PM  Result Value Ref Range Status   Specimen Description BLOOD LEFT FATTY CASTS  Final   Special Requests Normal  Final   Culture NO GROWTH < 24 HOURS  Final   Report Status PENDING  Incomplete  Culture, blood (routine x 2)     Status: None (Preliminary result)   Collection Time: 04/29/15 12:54 PM  Result Value Ref Range Status   Specimen Description BLOOD RIGHT RADIAL  Final   Special Requests   Final    BOTTLES DRAWN AEROBIC AND ANAEROBIC 1CC AEROBIC,1CC ANAEROBIC   Culture NO GROWTH < 24 HOURS  Final   Report Status PENDING  Incomplete  Urine culture     Status: None (Preliminary result)  Collection Time: 04/29/15  2:50 PM  Result Value Ref Range Status   Specimen Description URINE, RANDOM  Final   Special Requests Normal  Final   Culture TOO YOUNG TO READ  Final   Report Status PENDING  Incomplete     Studies: No results found.  Scheduled Meds: . allopurinol  300 mg Oral Daily  . azithromycin  500 mg Intravenous Q24H  . cefTRIAXone (ROCEPHIN)  IV  1 g Intravenous Q24H  . cholecalciferol  2,000 Units Oral Daily  . cloNIDine  0.1 mg Oral 3 times per day  . cyanocobalamin  500 mcg Oral Daily  . docusate sodium  100 mg Oral BID  . ferrous sulfate  325 mg Oral Q breakfast  . furosemide  40 mg Oral BID  . gabapentin  800 mg Oral 3 times per day  . glipiZIDE  10 mg Oral BID AC  . insulin aspart  0-5  Units Subcutaneous QHS  . insulin aspart  0-9 Units Subcutaneous TID WC  . isosorbide mononitrate  30 mg Oral Daily  . levothyroxine  100 mcg Oral QAC breakfast  . metoprolol succinate  25 mg Oral Daily  . pantoprazole  40 mg Oral Daily  . potassium chloride SA  20 mEq Oral Daily  . rOPINIRole  0.5 mg Oral QHS  . simvastatin  40 mg Oral QHS  . sodium chloride  3 mL Intravenous Q12H  . warfarin  1 mg Oral q1800  . Warfarin - Pharmacist Dosing Inpatient   Does not apply q1800    Continuous Infusions: . sodium chloride 75 mL/hr at 04/30/15 0813    Assessment/Plan: 1 Acute Respiratory Failure with Hypoxemia: On IV antibiotics for presumed Pneumonia 2 UTI; On IV Rocephin 3 Hyponatremia: Se Sodium improved to 140  D/c IV fluids 4 Chronic A-fib.Continue Metoprolol and Coumadin 5 Hypothyroidism; On Levothyroxine 6 CKD;  Se Creat improved to 1.70 Continue to monitor  7 HTN; Stable- Continue to monitor 8 CAD: Currently chest pain free-Continue Isno 9 Type 2 DM; On Glipizide and SSI 10 Hypokalemia; Replace  11 Weakness;Continue PT- Likely will need rehab   Code Status: Full           05/01/2015, 8:25 AM  LOS: 2 days

## 2015-05-01 NOTE — Care Management Important Message (Signed)
Important Message  Patient Details  Name: Haley Hill MRN: [id number] Date of Birth: [birth year]   Medicare Important Message Given:  Yes-second notification given    Olegario Messier A Allmond 05/01/2015, 9:59 AM

## 2015-05-01 NOTE — Progress Notes (Signed)
ANTICOAGULATION CONSULT NOTE - Initial Consult  Pharmacy Consult for WARFARIN Indication: atrial fibrillation  Allergies  Allergen Reactions  . Ciprofloxacin Itching and Rash  . Fosamax [Alendronate Sodium] Other (See Comments)    Reaction: Ulcers in mouth and esophagus    Patient Measurements: Height:  (160 cm) Weight: 204 lb (92.534 kg) IBW/kg (Calculated) : 52.4 Heparin Dosing Weight:   Vital Signs: Temp: 97.8 F (36.6 C) (09/07 0423) Temp Source: Oral (09/07 0423) BP: 104/71 mmHg (09/07 0423) Pulse Rate: 60 (09/07 0423)  Labs:  Recent Labs  04/29/15 1254 04/29/15 1656 04/30/15 0506 05/01/15 0528  HGB 12.3  --   --  10.8*  HCT 36.6  --   --  31.6*  PLT 79*  --   --  76*  LABPROT  --  33.3* 34.9* 27.5*  INR  --  3.27 3.47 2.55  CREATININE 2.13*  --  1.94* 1.70*  TROPONINI <0.03  --   --   --     Estimated Creatinine Clearance: 30.4 mL/min (by C-G formula based on Cr of 1.7).   Medical History: Past Medical History  Diagnosis Date  . Diabetes   . Hypertension   . Coronary disease   . Atrial flutter   . Hypothyroidism   . Right renal artery stenosis   . Vitiligo   . Peripheral neuropathy   . Renal insufficiency   . Syncope and collapse   . CKD (chronic kidney disease) stage 3, GFR 30-59 ml/min   . Gout   . Fracture of humeral shaft, right, closed   . Dehydration   . Atrial fibrillation     Medications:  Scheduled:  . allopurinol  300 mg Oral Daily  . azithromycin  500 mg Intravenous Q24H  . cefTRIAXone (ROCEPHIN)  IV  1 g Intravenous Q24H  . cholecalciferol  2,000 Units Oral Daily  . cloNIDine  0.1 mg Oral 3 times per day  . cyanocobalamin  500 mcg Oral Daily  . docusate sodium  100 mg Oral BID  . ferrous sulfate  325 mg Oral Q breakfast  . furosemide  40 mg Oral BID  . gabapentin  800 mg Oral 3 times per day  . glipiZIDE  10 mg Oral BID AC  . insulin aspart  0-5 Units Subcutaneous QHS  . insulin aspart  0-9 Units Subcutaneous TID WC   . isosorbide mononitrate  30 mg Oral Daily  . levothyroxine  100 mcg Oral QAC breakfast  . metoprolol succinate  25 mg Oral Daily  . pantoprazole  40 mg Oral Daily  . potassium chloride SA  20 mEq Oral Daily  . rOPINIRole  0.5 mg Oral QHS  . simvastatin  40 mg Oral QHS  . sodium chloride  3 mL Intravenous Q12H  . warfarin  1 mg Oral q1800  . Warfarin - Pharmacist Dosing Inpatient   Does not apply q1800    Assessment: 74 y.o. female with a known history of chronic kidney disease stage III, diabetes, chronic atrial fibrillation on Coumadin. Patient on Ceftriaxone/Azithromycin for possible CAP. Patient on Warfarin 2 mg daily at home with last dose noted for 9/4.  Goal of Therapy:  INR 2-3 Monitor platelets by anticoagulation protocol: Yes   Plan:  9/5:  INR 3.27   Will order lower dose of Warfarin 1 mg daily and recheck INR in am. Patient on antibiotics.  9/6 AM INR 3.47. Hold today's dose and recheck in AM.  9/7 AM INR 2.55. Resume warfarin 1  mg daily today. F/u INR in AM   Bari Mantis PharmD Clinical Pharmacist 05/01/2015  6:20 AM

## 2015-05-02 ENCOUNTER — Encounter
Admission: RE | Admit: 2015-05-02 | Discharge: 2015-05-02 | Disposition: A | Discharge status: Home / Self Care | Payer: Medicare Other | Source: Ambulatory Visit | Attending: Internal Medicine | Admitting: Internal Medicine

## 2015-05-02 DIAGNOSIS — I4891 Unspecified atrial fibrillation: Secondary | ICD-10-CM | POA: Insufficient documentation

## 2015-05-02 LAB — BASIC METABOLIC PANEL
Anion gap: 6 (ref 5–15)
BUN: 30 mg/dL — AB (ref 6–20)
CHLORIDE: 100 mmol/L — AB (ref 101–111)
CO2: 32 mmol/L (ref 22–32)
CREATININE: 1.57 mg/dL — AB (ref 0.44–1.00)
Calcium: 8.5 mg/dL — ABNORMAL LOW (ref 8.9–10.3)
GFR calc Af Amer: 36 mL/min — ABNORMAL LOW (ref >60)
GFR calc non Af Amer: 31 mL/min — ABNORMAL LOW (ref >60)
GLUCOSE: 195 mg/dL — AB (ref 65–99)
POTASSIUM: 4 mmol/L (ref 3.5–5.1)
Sodium: 138 mmol/L (ref 135–145)

## 2015-05-02 LAB — CBC WITH DIFFERENTIAL/PLATELET
Basophils Absolute: 0.1 10*3/uL (ref 0–0.1)
Basophils Relative: 1 %
Eosinophils Absolute: 0.3 10*3/uL (ref 0–0.7)
HCT: 32.8 % — ABNORMAL LOW (ref 35.0–47.0)
HEMOGLOBIN: 11.1 g/dL — AB (ref 12.0–16.0)
LYMPHS ABS: 1.6 10*3/uL (ref 1.0–3.6)
MCH: 34.4 pg — AB (ref 26.0–34.0)
MCHC: 34 g/dL (ref 32.0–36.0)
MCV: 101.1 fL — AB (ref 80.0–100.0)
Monocytes Absolute: 0.3 10*3/uL (ref 0.2–0.9)
Neutro Abs: 3.3 10*3/uL (ref 1.4–6.5)
Platelets: 89 10*3/uL — ABNORMAL LOW (ref 150–440)
RBC: 3.24 MIL/uL — AB (ref 3.80–5.20)
RDW: 16.1 % — ABNORMAL HIGH (ref 11.5–14.5)
WBC: 5.6 10*3/uL (ref 3.6–11.0)

## 2015-05-02 LAB — PROTIME-INR
INR: 2.33
Prothrombin Time: 25.7 seconds — ABNORMAL HIGH (ref 11.4–15.0)

## 2015-05-02 LAB — GLUCOSE, CAPILLARY
Glucose-Capillary: 153 mg/dL — ABNORMAL HIGH (ref 65–99)
Glucose-Capillary: 215 mg/dL — ABNORMAL HIGH (ref 65–99)

## 2015-05-02 MED ORDER — SENNOSIDES-DOCUSATE SODIUM 8.6-50 MG PO TABS: 1.0000 | ORAL_TABLET | Freq: Every evening | ORAL | Status: DC | PRN

## 2015-05-02 MED ORDER — CEFUROXIME AXETIL 250 MG PO TABS: 250.0000 mg | ORAL_TABLET | Freq: Two times a day (BID) | ORAL | Status: DC

## 2015-05-02 MED ORDER — WARFARIN SODIUM 1 MG PO TABS: 1.0000 mg | ORAL_TABLET | Freq: Every day | ORAL | Status: DC

## 2015-05-02 MED ORDER — HYDROCODONE-ACETAMINOPHEN 5-325 MG PO TABS: 1.0000 | ORAL_TABLET | Freq: Four times a day (QID) | ORAL | Status: DC | PRN

## 2015-05-02 NOTE — Progress Notes (Signed)
PROGRESS NOTE  Haley Hill ZOX:[id number] DOB: [birth year] DOA: 04/29/2015 PCP: Barbette Reichmann, MD  Subjective 74 y/o f with hx of Type 2 DM, CKD, Chronic A-fib, HTN admitted with fever ,weakness and Hypoxemia CXR was negative for Pneumonia. This am feels better    Objective: BP 105/74 mmHg  Pulse 58  Temp(Src) 97.5 F (36.4 C) (Oral)  Resp 16  Ht 5\' 3"  (1.6 m)  Wt 92.534 kg (204 lb)  BMI 36.15 kg/m2  SpO2 97%  Intake/Output Summary (Last 24 hours) at 05/02/15 0818 Last data filed at 05/02/15 0645  Gross per 24 hour  Intake    600 ml  Output   2425 ml  Net  -1825 ml   Filed Weights   04/29/15 1251  Weight: 92.534 kg (204 lb)    Exam:  General: Elderly  female, Not in distress at rest HEENT: PERRL; OP moist without lesions. Neck: supple, trachea midline, no thyromegaly Chest: normal to palpation Lungs: clear bilaterally without retractions or wheezes Cardiovascular: RRR, no murmur, no gallop Abdomen: soft, nontender, nondistended, positive bowel sounds Extremities: no clubbing, cyanosis,  1 + edema Neuro: alert and oriented, moves all extremities Derm: Venous stasis dermatitis on legs  Lymph: no cervical or supraclavicular lymphadenopathy   Labs and imaging studies were reviewed*  Data Reviewed: Basic Metabolic Panel:  Recent Labs Lab 04/29/15 1254 04/30/15 0506 05/01/15 0528 05/02/15 0517  NA 134* 140 140 138  K 3.3* 4.1 3.4* 4.0  CL 95* 104 100* 100*  CO2 29 31 32 32  GLUCOSE 216* 152* 74 195*  BUN 36* 32* 28* 30*  CREATININE 2.13* 1.94* 1.70* 1.57*  CALCIUM 8.5* 8.3* 8.5* 8.5*   Liver Function Tests:  Recent Labs Lab 04/29/15 1254  AST 30  ALT 16  ALKPHOS 111  BILITOT 1.6*  PROT 7.1  ALBUMIN 3.8   No results for input(s): LIPASE, AMYLASE in the last 168 hours. No results for input(s): AMMONIA in the last 168 hours. CBC:  Recent Labs Lab 04/29/15 1254 05/01/15 0528 05/02/15 0517  WBC 9.8 7.4 5.6  NEUTROABS 8.4* 5.3 3.3   HGB 12.3 10.8* 11.1*  HCT 36.6 31.6* 32.8*  MCV 101.4* 101.7* 101.1*  PLT 79* 76* 89*   Cardiac Enzymes:    Recent Labs Lab 04/29/15 1254  TROPONINI <0.03   BNP (last 3 results)  Recent Labs  04/29/15 1254  BNP 201.0*    ProBNP (last 3 results) No results for input(s): PROBNP in the last 8760 hours.  CBG:  Recent Labs Lab 04/30/15 2042 05/01/15 1133 05/01/15 1634 05/01/15 2117 05/02/15 0743  GLUCAP 126* 194* 196* 136* 153*    Recent Results (from the past 240 hour(s))  Culture, blood (routine x 2)     Status: None (Preliminary result)   Collection Time: 04/29/15 12:54 PM  Result Value Ref Range Status   Specimen Description BLOOD LEFT FATTY CASTS  Final   Special Requests Normal  Final   Culture NO GROWTH 2 DAYS  Final   Report Status PENDING  Incomplete  Culture, blood (routine x 2)     Status: None (Preliminary result)   Collection Time: 04/29/15 12:54 PM  Result Value Ref Range Status   Specimen Description BLOOD RIGHT RADIAL  Final   Special Requests   Final    BOTTLES DRAWN AEROBIC AND ANAEROBIC 1CC AEROBIC,1CC ANAEROBIC   Culture NO GROWTH 2 DAYS  Final   Report Status PENDING  Incomplete  Urine culture     Status:  None   Collection Time: 04/29/15  2:50 PM  Result Value Ref Range Status   Specimen Description URINE, RANDOM  Final   Special Requests Normal  Final   Culture MULTIPLE SPECIES PRESENT, SUGGEST RECOLLECTION  Final   Report Status 05/01/2015 FINAL  Final     Studies: No results found.  Scheduled Meds: . allopurinol  300 mg Oral Daily  . azithromycin  500 mg Intravenous Q24H  . cefTRIAXone (ROCEPHIN)  IV  1 g Intravenous Q24H  . cholecalciferol  2,000 Units Oral Daily  . cloNIDine  0.1 mg Oral 3 times per day  . cyanocobalamin  500 mcg Oral Daily  . docusate sodium  100 mg Oral BID  . ferrous sulfate  325 mg Oral Q breakfast  . furosemide  40 mg Oral BID  . gabapentin  800 mg Oral 3 times per day  . glipiZIDE  10 mg Oral BID  AC  . insulin aspart  0-5 Units Subcutaneous QHS  . insulin aspart  0-9 Units Subcutaneous TID WC  . isosorbide mononitrate  30 mg Oral Daily  . levothyroxine  100 mcg Oral QAC breakfast  . metoprolol succinate  25 mg Oral Daily  . pantoprazole  40 mg Oral Daily  . potassium chloride SA  20 mEq Oral Daily  . rOPINIRole  0.5 mg Oral QHS  . simvastatin  40 mg Oral QHS  . sodium chloride  3 mL Intravenous Q12H  . warfarin  1 mg Oral q1800  . Warfarin - Pharmacist Dosing Inpatient   Does not apply q1800    Continuous Infusions:    Assessment/Plan: 1 Acute Respiratory Failure with Hypoxemia: On IV antibiotics for presumed Pneumonia 2 UTI; On IV Rocephin 3 Hyponatremia: Se Sodium improved to 138  4 Chronic A-fib.Continue Metoprolol and Coumadin 5 Hypothyroidism; On Levothyroxine 6 CKD;  Se Creat improved to 1.57 Continue to monitor  7 HTN; Stable- Continue to monitor 8 CAD: Currently chest pain free-Continue Isno 9 Type 2 DM; On Glipizide and SSI 10 Hypokalemia; Replace  11 Weakness;Continue PT- To Rehab later today   Code Status: Full           05/02/2015, 8:18 AM  LOS: 3 days

## 2015-05-02 NOTE — Progress Notes (Signed)
Patient for d/c today to SNF bed at [facility]- patient and family Tresa Endo) aware and agreeable to plans- plan transfer via EMS. Reece Levy, MSW, Theresia Majors [phone]

## 2015-05-02 NOTE — Progress Notes (Signed)
ANTICOAGULATION CONSULT NOTE - Initial Consult  Pharmacy Consult for WARFARIN Indication: atrial fibrillation  Allergies  Allergen Reactions  . Ciprofloxacin Itching and Rash  . Fosamax [Alendronate Sodium] Other (See Comments)    Reaction: Ulcers in mouth and esophagus    Patient Measurements: Height: 5\' 3"  (160 cm) Weight: 204 lb (92.534 kg) IBW/kg (Calculated) : 52.4 Heparin Dosing Weight:   Vital Signs: Temp: 98 F (36.7 C) (09/08 0412) Temp Source: Oral (09/08 0412) BP: 131/71 mmHg (09/08 0412) Pulse Rate: 92 (09/08 0412)  Labs:  Recent Labs  04/29/15 1254  04/30/15 0506 05/01/15 0528 05/02/15 0517  HGB 12.3  --   --  10.8* 11.1*  HCT 36.6  --   --  31.6* 32.8*  PLT 79*  --   --  76* 89*  LABPROT  --   < > 34.9* 27.5* 25.7*  INR  --   < > 3.47 2.55 2.33  CREATININE 2.13*  --  1.94* 1.70* 1.57*  TROPONINI <0.03  --   --   --   --   < > = values in this interval not displayed.  Estimated Creatinine Clearance: 32.9 mL/min (by C-G formula based on Cr of 1.57).   Medical History: Past Medical History  Diagnosis Date  . Diabetes   . Hypertension   . Coronary disease   . Atrial flutter   . Hypothyroidism   . Right renal artery stenosis   . Vitiligo   . Peripheral neuropathy   . Renal insufficiency   . Syncope and collapse   . CKD (chronic kidney disease) stage 3, GFR 30-59 ml/min   . Gout   . Fracture of humeral shaft, right, closed   . Dehydration   . Atrial fibrillation     Medications:  Scheduled:  . allopurinol  300 mg Oral Daily  . azithromycin  500 mg Intravenous Q24H  . cefTRIAXone (ROCEPHIN)  IV  1 g Intravenous Q24H  . cholecalciferol  2,000 Units Oral Daily  . cloNIDine  0.1 mg Oral 3 times per day  . cyanocobalamin  500 mcg Oral Daily  . docusate sodium  100 mg Oral BID  . ferrous sulfate  325 mg Oral Q breakfast  . furosemide  40 mg Oral BID  . gabapentin  800 mg Oral 3 times per day  . glipiZIDE  10 mg Oral BID AC  . insulin  aspart  0-5 Units Subcutaneous QHS  . insulin aspart  0-9 Units Subcutaneous TID WC  . isosorbide mononitrate  30 mg Oral Daily  . levothyroxine  100 mcg Oral QAC breakfast  . metoprolol succinate  25 mg Oral Daily  . pantoprazole  40 mg Oral Daily  . potassium chloride SA  20 mEq Oral Daily  . rOPINIRole  0.5 mg Oral QHS  . simvastatin  40 mg Oral QHS  . sodium chloride  3 mL Intravenous Q12H  . warfarin  1 mg Oral q1800  . Warfarin - Pharmacist Dosing Inpatient   Does not apply q1800    Assessment: 74 y.o. female with a known history of chronic kidney disease stage III, diabetes, chronic atrial fibrillation on Coumadin. Patient on Ceftriaxone/Azithromycin for possible CAP. Patient on Warfarin 2 mg daily at home with last dose noted for 9/4.  Goal of Therapy:  INR 2-3 Monitor platelets by anticoagulation protocol: Yes   Plan:  9/5:  INR 3.27   Will order lower dose of Warfarin 1 mg daily and recheck INR in am. Patient  on antibiotics.  9/6 AM INR 3.47. Hold today's dose and recheck in AM.  9/7 AM INR 2.55. Resume warfarin 1 mg daily today. F/u INR in AM  9/8 INR 2.33. Continue current regimen and f/u INR in AM.   Bari Mantis PharmD Clinical Pharmacist 05/02/2015  5:55 AM

## 2015-05-02 NOTE — Clinical Documentation Improvement (Signed)
Internal Medicine      Please provide clinical indicators, historical, or baseline comparative data to support your documented diagnosis of Acute Respiratory Failure with Hypoxemia.  Such as: lab values, treatment plan, O2 Sats, O2 treatment or respiratory failure etc  If the diagnosis is no longer applicable, amend your documentation as appropriate.  Please exercise your independent, professional judgment when responding. A specific answer is not anticipated or expected.  Thank You, Nevin Bloodgood, RN, BSN, CCDS,Clinical Documentation Specialist:  [phone]  [phone]=[employer] [place]- [facility] Information Management

## 2015-05-02 NOTE — Progress Notes (Signed)
Patient being discharged to [facility] & report called to Melody. IV's removed & EMS called. Patient's belongings have been packed and patient is ready for pickup.

## 2015-05-02 NOTE — Progress Notes (Signed)
CSW provided SNF bed offers to patient and she has selected [facility]	[place]. Awaiting final dc orders and summary for transfer later today.  Reece Levy, MSW, Theresia Majors [phone]

## 2015-05-02 NOTE — Discharge Summary (Signed)
Physician Discharge Summary  Haley Hill ZOX:[id number] DOB: [birth year] DOA: 04/29/2015  PCP: Barbette Reichmann, MD  Admit date: 04/29/2015 Discharge date: 05/02/2015  Time spent: 35  minutes  Recommendations for Outpatient Follow-up:  1 Discharge to Skilled Nursing Facility. 2 Call office to make appt in 1-2 weeks   . Discharge Diagnoses:  1 Acute Respiratory failure secondary to Pneumonia 2 UTI 3 Chronic A-fib 4 Type 2 DM 5 Essential HTN 6 Acute on Chronic Renal Failure 7 CAD 8 Weakness    Discharge Condition: Fair  Diet recommendation: 1800 cals   Filed Weights   04/29/15 1251  Weight: 92.534 kg (204 lb)    History of present illness:  Haley Hill is a 74 year old female, with a known history of type 2 diabetes chronic atrial fibrillation, Hypertension, CKD, admitted with fever and hypoxemia patient states that she had not been feeling well for a few days prior to admission and reportedly went to the bathroom and became very weak and subsequently fell to the floor. In the ED patient was noted have a fever 101.5 and O2 sat of 80% on room air  Hospital Course:  Patient was admitted [facility] and was treated empirically for pneumonia with IV antibiotics and also treated for UTI. Her serum sodium on admission was 134 that improved to 140 she also had a serum creatinine of 2.13 on admission that improved to 1.57 with IV fluids. Patient was treated with intravenous Rocephin and Zithromax. She improved clinically she was also seen by physical therapy who recommended skilled nursing. Patient was advised complete a course of antibiotics with Ceftin 250 mg po bid x 5 days  and will follow back with me Dr. Marcello Fennel in the clinic in 2-3 weeks      Discharge Exam: Filed Vitals:   05/02/15 0731  BP: 105/74  Pulse: 58  Temp: 97.5 F (36.4 C)  Resp: 16    General: Not in distress Cardiovascular: S1 S2 Respiratory: Clear to auscultation  Discharge  Instructions    Current Discharge Medication List    START taking these medications   Details  senna-docusate (SENOKOT-S) 8.6-50 MG per tablet Take 1 tablet by mouth at bedtime as needed for mild constipation. Qty: 30 tablet, Refills: 1      CONTINUE these medications which have CHANGED   Details  HYDROcodone-acetaminophen (NORCO/VICODIN) 5-325 MG per tablet Take 1 tablet by mouth every 6 (six) hours as needed for moderate pain. Qty: 30 tablet, Refills: 0    warfarin (COUMADIN) 1 MG tablet Take 1 tablet (1 mg total) by mouth daily at 6 PM. Qty: 30 tablet, Refills: 2      CONTINUE these medications which have NOT CHANGED   Details  allopurinol (ZYLOPRIM) 300 MG tablet Take 300 mg by mouth daily.    Cholecalciferol (VITAMIN D) 2000 UNITS CAPS Take 2,000 Units by mouth daily.    cloNIDine (CATAPRES) 0.1 MG tablet Take 0.1 mg by mouth every 8 (eight) hours.     cyanocobalamin 500 MCG tablet Take 500 mcg by mouth daily.    docusate sodium (COLACE) 100 MG capsule Take 1 capsule (100 mg total) by mouth 2 (two) times daily. Qty: 10 capsule, Refills: 0    ferrous sulfate 325 (65 FE) MG tablet Take 325 mg by mouth daily with breakfast.    furosemide (LASIX) 40 MG tablet Take 1 tablet (40 mg total) by mouth 2 (two) times daily. Qty: 60 tablet, Refills: 3    gabapentin (NEURONTIN)  800 MG tablet Take 800 mg by mouth every 8 (eight) hours.     glipiZIDE (GLUCOTROL) 10 MG tablet Take 10 mg by mouth 2 (two) times daily before a meal.    isosorbide mononitrate (IMDUR) 30 MG 24 hr tablet Take 30 mg by mouth daily.     levothyroxine (SYNTHROID, LEVOTHROID) 100 MCG tablet Take 100 mcg by mouth daily before breakfast.    metoprolol succinate (TOPROL-XL) 50 MG 24 hr tablet Take 25 mg by mouth daily.    omeprazole (PRILOSEC) 20 MG capsule Take 20 mg by mouth 2 (two) times daily.    potassium chloride SA (K-DUR,KLOR-CON) 20 MEQ tablet Take 20 mEq by mouth daily.    rOPINIRole (REQUIP)  0.5 MG tablet Take 0.5 mg by mouth at bedtime.    simvastatin (ZOCOR) 40 MG tablet Take 40 mg by mouth at bedtime.       STOP taking these medications     oxyCODONE-acetaminophen (PERCOCET/ROXICET) 5-325 MG per tablet        Allergies  Allergen Reactions  . Ciprofloxacin Itching and Rash  . Fosamax [Alendronate Sodium] Other (See Comments)    Reaction: Ulcers in mouth and esophagus      The results of significant diagnostics from this hospitalization (including imaging, microbiology, ancillary and laboratory) are listed below for reference.    Significant Diagnostic Studies: X-ray Chest Pa And Lateral  04/30/2015   CLINICAL DATA:  Pneumonia  EXAM: CHEST  2 VIEW  COMPARISON:  04/29/2015  FINDINGS: Chronic cardiomegaly. Patient is status post CABG. Stable aortic and hilar contours. Question trace posterior costophrenic sulcus pleural fluid. No pneumonia, edema, or pneumothorax. Chronic exaggerated thoracic kyphosis and mid thoracic compression fracture.  IMPRESSION: Negative for pneumonia or edema.   Electronically Signed   By: Marnee Spring M.D.   On: 04/30/2015 07:42   Dg Chest 2 View  04/29/2015   CLINICAL DATA:  74 year old female with weakness and fever  EXAM: CHEST  2 VIEW  COMPARISON:  Prior chest x-ray 01/25/2015  FINDINGS: Stable cardiomegaly. Atherosclerotic calcifications again noted in the transverse aorta. Patient is status post median sternotomy with evidence of prior multivessel CABG. Stable central bronchitic change in mild interstitial prominence. No focal airspace consolidation, pleural effusion or pneumothorax. No evidence of pulmonary edema. Stable mid thoracic compression fracture.  IMPRESSION: Stable chest x-ray without evidence of acute cardiopulmonary disease.   Electronically Signed   By: Malachy Moan M.D.   On: 04/29/2015 13:52   Dg Pelvis 1-2 Views  04/29/2015   CLINICAL DATA:  Status post fall.  Pain.  EXAM: PELVIS - 1-2 VIEW  COMPARISON:  01/25/2015   FINDINGS: There is no evidence of pelvic fracture or diastasis. No pelvic bone lesions are seen. There is peripheral vascular atherosclerotic disease.  IMPRESSION: Negative.   Electronically Signed   By: Elige Ko   On: 04/29/2015 13:59   Dg Knee Complete 4 Views Right  04/29/2015   CLINICAL DATA:  Weakness preventing ambulation.  Fall.  Knee pain.  EXAM: RIGHT KNEE - COMPLETE 4+ VIEW  COMPARISON:  None.  FINDINGS: Bony demineralization.  Vascular calcification.  Articular spurring in the patella. No fracture or knee effusion is identified.  IMPRESSION: 1. No acute findings. 2. Degenerative spurring of the patella. 3. Bony demineralization. 4. Vascular calcifications.   Electronically Signed   By: Gaylyn Rong M.D.   On: 04/29/2015 14:07    Microbiology: Recent Results (from the past 240 hour(s))  Culture, blood (routine x 2)  Status: None (Preliminary result)   Collection Time: 04/29/15 12:54 PM  Result Value Ref Range Status   Specimen Description BLOOD LEFT FATTY CASTS  Final   Special Requests Normal  Final   Culture NO GROWTH 3 DAYS  Final   Report Status PENDING  Incomplete  Culture, blood (routine x 2)     Status: None (Preliminary result)   Collection Time: 04/29/15 12:54 PM  Result Value Ref Range Status   Specimen Description BLOOD RIGHT RADIAL  Final   Special Requests   Final    BOTTLES DRAWN AEROBIC AND ANAEROBIC 1CC AEROBIC,1CC ANAEROBIC   Culture NO GROWTH 3 DAYS  Final   Report Status PENDING  Incomplete  Urine culture     Status: None   Collection Time: 04/29/15  2:50 PM  Result Value Ref Range Status   Specimen Description URINE, RANDOM  Final   Special Requests Normal  Final   Culture MULTIPLE SPECIES PRESENT, SUGGEST RECOLLECTION  Final   Report Status 05/01/2015 FINAL  Final     Labs: Basic Metabolic Panel:  Recent Labs Lab 04/29/15 1254 04/30/15 0506 05/01/15 0528 05/02/15 0517  NA 134* 140 140 138  K 3.3* 4.1 3.4* 4.0  CL 95* 104 100* 100*   CO2 29 31 32 32  GLUCOSE 216* 152* 74 195*  BUN 36* 32* 28* 30*  CREATININE 2.13* 1.94* 1.70* 1.57*  CALCIUM 8.5* 8.3* 8.5* 8.5*   Liver Function Tests:  Recent Labs Lab 04/29/15 1254  AST 30  ALT 16  ALKPHOS 111  BILITOT 1.6*  PROT 7.1  ALBUMIN 3.8   No results for input(s): LIPASE, AMYLASE in the last 168 hours. No results for input(s): AMMONIA in the last 168 hours. CBC:  Recent Labs Lab 04/29/15 1254 05/01/15 0528 05/02/15 0517  WBC 9.8 7.4 5.6  NEUTROABS 8.4* 5.3 3.3  HGB 12.3 10.8* 11.1*  HCT 36.6 31.6* 32.8*  MCV 101.4* 101.7* 101.1*  PLT 79* 76* 89*   Cardiac Enzymes:  Recent Labs Lab 04/29/15 1254  TROPONINI <0.03   BNP: BNP (last 3 results)  Recent Labs  04/29/15 1254  BNP 201.0*    ProBNP (last 3 results) No results for input(s): PROBNP in the last 8760 hours.  CBG:  Recent Labs Lab 05/01/15 1133 05/01/15 1634 05/01/15 2117 05/02/15 0743 05/02/15 1146  GLUCAP 194* 196* 136* 153* 215*       Signed:  Ashonti Leandro   05/02/2015, 12:28 PM

## 2015-05-04 LAB — CULTURE, BLOOD (ROUTINE X 2)
Culture: NO GROWTH
Culture: NO GROWTH
Special Requests: NORMAL

## 2015-05-07 DIAGNOSIS — I4891 Unspecified atrial fibrillation: Secondary | ICD-10-CM | POA: Diagnosis not present

## 2015-05-07 LAB — PROTIME-INR
INR: 1.73
PROTHROMBIN TIME: 20.4 s — AB (ref 11.4–15.0)

## 2015-05-10 DIAGNOSIS — I4891 Unspecified atrial fibrillation: Secondary | ICD-10-CM | POA: Diagnosis not present

## 2015-05-10 LAB — GLUCOSE, CAPILLARY
GLUCOSE-CAPILLARY: 134 mg/dL — AB (ref 65–99)
GLUCOSE-CAPILLARY: 217 mg/dL — AB (ref 65–99)
GLUCOSE-CAPILLARY: 102 mg/dL — AB (ref 65–99)
GLUCOSE-CAPILLARY: 260 mg/dL — AB (ref 65–99)
GLUCOSE-CAPILLARY: 330 mg/dL — AB (ref 65–99)

## 2015-05-11 DIAGNOSIS — I4891 Unspecified atrial fibrillation: Secondary | ICD-10-CM | POA: Diagnosis not present

## 2015-05-11 LAB — GLUCOSE, CAPILLARY: GLUCOSE-CAPILLARY: 207 mg/dL — AB (ref 65–99)

## 2015-05-12 LAB — GLUCOSE, CAPILLARY
GLUCOSE-CAPILLARY: 282 mg/dL — AB (ref 65–99)
GLUCOSE-CAPILLARY: 175 mg/dL — AB (ref 65–99)
GLUCOSE-CAPILLARY: 249 mg/dL — AB (ref 65–99)
GLUCOSE-CAPILLARY: 219 mg/dL — AB (ref 65–99)
GLUCOSE-CAPILLARY: 177 mg/dL — AB (ref 65–99)
Glucose-Capillary: 174 mg/dL — ABNORMAL HIGH (ref 65–99)
Glucose-Capillary: 201 mg/dL — ABNORMAL HIGH (ref 65–99)
Glucose-Capillary: 209 mg/dL — ABNORMAL HIGH (ref 65–99)

## 2015-05-13 ENCOUNTER — Ambulatory Visit (INDEPENDENT_AMBULATORY_CARE_PROVIDER_SITE_OTHER): Payer: Medicare Other | Admitting: Podiatry

## 2015-05-13 DIAGNOSIS — M79676 Pain in unspecified toe(s): Secondary | ICD-10-CM

## 2015-05-13 DIAGNOSIS — B351 Tinea unguium: Secondary | ICD-10-CM

## 2015-05-13 DIAGNOSIS — I4891 Unspecified atrial fibrillation: Secondary | ICD-10-CM | POA: Diagnosis not present

## 2015-05-13 NOTE — Progress Notes (Signed)
She presents today with chief complaint of painful elongated toenails she has recently taken another fall and she is also concerned about third toe of the left foot.  Objective: Pulses remain palpable. Hammertoe deformities bilateral. Result in reactive hyperkeratosis and superficial ulceration third digit left foot. This appears to be clean and healed at this point. However the nails are thick yellow dystrophic and mycotic and no other ulcerations are noted.  Assessment: Diabetes mellitus with a history of ulceration third digit left foot. Pain elicited onychomycosis bilateral.  Plan: Debridement of nails 1 through 5 bilateral. Previous ulcer sites healed. 3 mo.

## 2015-05-14 DIAGNOSIS — I4891 Unspecified atrial fibrillation: Secondary | ICD-10-CM | POA: Diagnosis not present

## 2015-05-14 LAB — GLUCOSE, CAPILLARY
GLUCOSE-CAPILLARY: 220 mg/dL — AB (ref 65–99)
GLUCOSE-CAPILLARY: 161 mg/dL — AB (ref 65–99)
Glucose-Capillary: 166 mg/dL — ABNORMAL HIGH (ref 65–99)
Glucose-Capillary: 187 mg/dL — ABNORMAL HIGH (ref 65–99)

## 2015-05-14 LAB — PROTIME-INR
INR: 1.35
Prothrombin Time: 16.9 seconds — ABNORMAL HIGH (ref 11.4–15.0)

## 2015-05-15 LAB — GLUCOSE, CAPILLARY
GLUCOSE-CAPILLARY: 168 mg/dL — AB (ref 65–99)
GLUCOSE-CAPILLARY: 208 mg/dL — AB (ref 65–99)
GLUCOSE-CAPILLARY: 126 mg/dL — AB (ref 65–99)
Glucose-Capillary: 194 mg/dL — ABNORMAL HIGH (ref 65–99)
Glucose-Capillary: 177 mg/dL — ABNORMAL HIGH (ref 65–99)
Glucose-Capillary: 94 mg/dL (ref 65–99)
Glucose-Capillary: 184 mg/dL — ABNORMAL HIGH (ref 65–99)

## 2015-05-17 ENCOUNTER — Other Ambulatory Visit
Admission: RE | Admit: 2015-05-17 | Discharge: 2015-05-17 | Disposition: A | Discharge status: Home / Self Care | Payer: Medicare Other | Source: Ambulatory Visit | Attending: Gerontology | Admitting: Gerontology

## 2015-05-17 DIAGNOSIS — I4891 Unspecified atrial fibrillation: Secondary | ICD-10-CM | POA: Insufficient documentation

## 2015-05-17 LAB — PROTIME-INR
INR: 1.61
PROTHROMBIN TIME: 19.3 s — AB (ref 11.4–15.0)

## 2015-05-21 ENCOUNTER — Other Ambulatory Visit
Admission: RE | Admit: 2015-05-21 | Discharge: 2015-05-21 | Disposition: A | Discharge status: Home / Self Care | Payer: Medicare Other | Source: Other Acute Inpatient Hospital | Attending: Internal Medicine | Admitting: Internal Medicine

## 2015-05-21 DIAGNOSIS — I4891 Unspecified atrial fibrillation: Secondary | ICD-10-CM | POA: Diagnosis present

## 2015-05-21 LAB — PROTIME-INR
INR: 2.24
Prothrombin Time: 24.9 seconds — ABNORMAL HIGH (ref 11.4–15.0)

## 2015-06-21 ENCOUNTER — Other Ambulatory Visit: Payer: Self-pay | Admitting: Internal Medicine

## 2015-06-21 DIAGNOSIS — Z1231 Encounter for screening mammogram for malignant neoplasm of breast: Secondary | ICD-10-CM

## 2015-07-05 ENCOUNTER — Ambulatory Visit
Admission: RE | Admit: 2015-07-05 | Discharge: 2015-07-05 | Disposition: A | Discharge status: Home / Self Care | Payer: Medicare Other | Source: Ambulatory Visit | Attending: Internal Medicine | Admitting: Internal Medicine

## 2015-07-05 ENCOUNTER — Other Ambulatory Visit: Payer: Self-pay | Admitting: Internal Medicine

## 2015-07-05 DIAGNOSIS — Z1231 Encounter for screening mammogram for malignant neoplasm of breast: Secondary | ICD-10-CM | POA: Insufficient documentation

## 2015-07-10 ENCOUNTER — Other Ambulatory Visit: Payer: Self-pay | Admitting: Internal Medicine

## 2015-07-10 DIAGNOSIS — R928 Other abnormal and inconclusive findings on diagnostic imaging of breast: Secondary | ICD-10-CM

## 2015-07-29 ENCOUNTER — Ambulatory Visit
Admission: RE | Admit: 2015-07-29 | Discharge: 2015-07-29 | Disposition: A | Discharge status: Home / Self Care | Payer: Medicare Other | Source: Ambulatory Visit | Attending: Internal Medicine | Admitting: Internal Medicine

## 2015-07-29 DIAGNOSIS — R928 Other abnormal and inconclusive findings on diagnostic imaging of breast: Secondary | ICD-10-CM | POA: Diagnosis present

## 2015-07-29 DIAGNOSIS — N63 Unspecified lump in breast: Secondary | ICD-10-CM | POA: Insufficient documentation

## 2015-08-12 ENCOUNTER — Ambulatory Visit (INDEPENDENT_AMBULATORY_CARE_PROVIDER_SITE_OTHER): Payer: Medicare Other | Admitting: Podiatry

## 2015-08-12 ENCOUNTER — Encounter: Payer: Self-pay | Admitting: Podiatry

## 2015-08-12 ENCOUNTER — Ambulatory Visit: Payer: Medicare Other

## 2015-08-12 DIAGNOSIS — B351 Tinea unguium: Secondary | ICD-10-CM

## 2015-08-12 DIAGNOSIS — M79676 Pain in unspecified toe(s): Secondary | ICD-10-CM | POA: Diagnosis not present

## 2015-08-12 DIAGNOSIS — L89891 Pressure ulcer of other site, stage 1: Secondary | ICD-10-CM

## 2015-08-12 DIAGNOSIS — L97521 Non-pressure chronic ulcer of other part of left foot limited to breakdown of skin: Secondary | ICD-10-CM

## 2015-08-12 NOTE — Progress Notes (Signed)
She presents today with chief complaint of a painful toenails 1 through 5 bilaterally. She also complaining of painful hammertoe deformities. She states that she has corns to the distal aspect of her toes and the fourth toe on the left foot has a sore on it. She states she has been soaking it in Epsom salts and warm water regularly and covering it.  Objective: Vital signs are stable alert and oriented 3. Pulses are palpable. No change from previous findings. Her toenails are thick yellow dystrophic onychomycotic. Distal clavi the second digit of the left foot and a superficial ulceration measuring less than 3 mm in diameter without signs of infection. Mild reactive hyperkeratosis surrounding the wound.  Assessment: Pain in limb secondary to onychomycosis hammertoe deformities and superficial ulceration fourth left.  Plan: Debrided reactive hyperkeratoses debrided toenails 1 through 5 bilateral covers are secondary to pain and debrided ulceration. If her bipolar measures care and soaking of her toe. I will follow-up with her as needed for 3 months.

## 2015-08-20 ENCOUNTER — Other Ambulatory Visit
Admission: RE | Admit: 2015-08-20 | Discharge: 2015-08-20 | Disposition: A | Discharge status: Home / Self Care | Payer: Medicare Other | Source: Skilled Nursing Facility | Attending: Family Medicine | Admitting: Family Medicine

## 2015-08-20 DIAGNOSIS — R0602 Shortness of breath: Secondary | ICD-10-CM | POA: Diagnosis present

## 2015-08-20 LAB — COMPREHENSIVE METABOLIC PANEL
ALT: 18 U/L (ref 14–54)
AST: 31 U/L (ref 15–41)
Albumin: 3.7 g/dL (ref 3.5–5.0)
Alkaline Phosphatase: 118 U/L (ref 38–126)
Anion gap: 11 (ref 5–15)
BUN: 25 mg/dL — ABNORMAL HIGH (ref 6–20)
CO2: 31 mmol/L (ref 22–32)
Calcium: 8.5 mg/dL — ABNORMAL LOW (ref 8.9–10.3)
Chloride: 97 mmol/L — ABNORMAL LOW (ref 101–111)
Creatinine, Ser: 1.71 mg/dL — ABNORMAL HIGH (ref 0.44–1.00)
GFR calc Af Amer: 32 mL/min — ABNORMAL LOW (ref >60)
GFR calc non Af Amer: 28 mL/min — ABNORMAL LOW (ref >60)
Glucose, Bld: 231 mg/dL — ABNORMAL HIGH (ref 65–99)
Potassium: 3.5 mmol/L (ref 3.5–5.1)
Sodium: 139 mmol/L (ref 135–145)
Total Bilirubin: 1.2 mg/dL (ref 0.3–1.2)
Total Protein: 6.6 g/dL (ref 6.5–8.1)

## 2015-11-11 ENCOUNTER — Encounter: Payer: Self-pay | Admitting: Podiatry

## 2015-11-11 ENCOUNTER — Ambulatory Visit (INDEPENDENT_AMBULATORY_CARE_PROVIDER_SITE_OTHER): Payer: Medicare Other | Admitting: Podiatry

## 2015-11-11 DIAGNOSIS — B351 Tinea unguium: Secondary | ICD-10-CM | POA: Diagnosis not present

## 2015-11-11 DIAGNOSIS — M79676 Pain in unspecified toe(s): Secondary | ICD-10-CM | POA: Diagnosis not present

## 2015-11-11 NOTE — Progress Notes (Signed)
She presents today for a chief complaint of painful elongated toenails.  Objective: vital signs are stable alert and oriented 3. Pulses are strongly palpable. Neurologic sensorium is intact. Deep tendon reflexes are intact. Muscle strength +5 over 5. Toenails are thick yellow dystrophic onychomycotic was sharply elevated nail margins.  Assessment: Pain in limb secondary to onychomycosis 1 through 5 bilateral.  Plan: Debridement of toenails 1 through 5 bilateral.

## 2015-12-18 ENCOUNTER — Other Ambulatory Visit: Payer: Self-pay | Admitting: Internal Medicine

## 2015-12-18 DIAGNOSIS — M25561 Pain in right knee: Secondary | ICD-10-CM

## 2015-12-23 ENCOUNTER — Ambulatory Visit
Admission: RE | Admit: 2015-12-23 | Discharge: 2015-12-23 | Disposition: A | Discharge status: Home / Self Care | Payer: Medicare Other | Source: Ambulatory Visit | Attending: Internal Medicine | Admitting: Internal Medicine

## 2015-12-23 DIAGNOSIS — S83241A Other tear of medial meniscus, current injury, right knee, initial encounter: Secondary | ICD-10-CM | POA: Insufficient documentation

## 2015-12-23 DIAGNOSIS — M84359A Stress fracture, hip, unspecified, initial encounter for fracture: Secondary | ICD-10-CM | POA: Diagnosis not present

## 2015-12-23 DIAGNOSIS — M25561 Pain in right knee: Secondary | ICD-10-CM | POA: Diagnosis present

## 2015-12-23 DIAGNOSIS — M659 Synovitis and tenosynovitis, unspecified: Secondary | ICD-10-CM | POA: Diagnosis not present

## 2015-12-23 DIAGNOSIS — I8391 Asymptomatic varicose veins of right lower extremity: Secondary | ICD-10-CM | POA: Diagnosis not present

## 2015-12-23 DIAGNOSIS — M25461 Effusion, right knee: Secondary | ICD-10-CM | POA: Diagnosis not present

## 2016-01-01 ENCOUNTER — Ambulatory Visit: Payer: Medicare Other

## 2016-01-14 ENCOUNTER — Other Ambulatory Visit: Payer: Self-pay | Admitting: Internal Medicine

## 2016-01-14 DIAGNOSIS — N6310 Unspecified lump in the right breast, unspecified quadrant: Secondary | ICD-10-CM

## 2016-01-28 ENCOUNTER — Ambulatory Visit
Admission: RE | Admit: 2016-01-28 | Discharge: 2016-01-28 | Disposition: A | Discharge status: Home / Self Care | Payer: Medicare Other | Source: Ambulatory Visit | Attending: Internal Medicine | Admitting: Internal Medicine

## 2016-01-28 DIAGNOSIS — N6310 Unspecified lump in the right breast, unspecified quadrant: Secondary | ICD-10-CM

## 2016-01-28 DIAGNOSIS — N63 Unspecified lump in breast: Secondary | ICD-10-CM | POA: Insufficient documentation

## 2016-02-17 ENCOUNTER — Ambulatory Visit (INDEPENDENT_AMBULATORY_CARE_PROVIDER_SITE_OTHER): Payer: Medicare Other | Admitting: Podiatry

## 2016-02-17 ENCOUNTER — Encounter: Payer: Self-pay | Admitting: Podiatry

## 2016-02-17 DIAGNOSIS — M79676 Pain in unspecified toe(s): Secondary | ICD-10-CM

## 2016-02-17 DIAGNOSIS — B351 Tinea unguium: Secondary | ICD-10-CM | POA: Diagnosis not present

## 2016-02-17 DIAGNOSIS — Q828 Other specified congenital malformations of skin: Secondary | ICD-10-CM | POA: Diagnosis not present

## 2016-02-17 NOTE — Progress Notes (Signed)
She presents today with chief complaint of painful elongated toenails 1 through 5 bilateral.  Objective: Vital signs are stable alert and oriented 3. Pulses are palpable. Neurologic sensorium is intact. Deep tendon reflexes are intact. Muscle strength is 5 over 5 dorsiflexion plantar flexors and inverters everters all into the musculature is intact. Orthopedic evaluation demonstrates all joints distal to the ankle for range of motion or crepitation. Her toenails are thick yellow dystrophic onychomycotic and painful palpation.  Assessment: Pain limb segment onychomycosis.  Plan: Debridement toenails 1 through 5 bilateral.

## 2016-04-22 ENCOUNTER — Telehealth: Payer: Self-pay | Admitting: *Deleted

## 2016-04-22 ENCOUNTER — Ambulatory Visit (INDEPENDENT_AMBULATORY_CARE_PROVIDER_SITE_OTHER): Payer: Medicare Other | Admitting: Podiatry

## 2016-04-22 ENCOUNTER — Encounter: Payer: Self-pay | Admitting: Podiatry

## 2016-04-22 DIAGNOSIS — I739 Peripheral vascular disease, unspecified: Secondary | ICD-10-CM

## 2016-04-22 DIAGNOSIS — Q828 Other specified congenital malformations of skin: Secondary | ICD-10-CM

## 2016-04-22 DIAGNOSIS — M79676 Pain in unspecified toe(s): Secondary | ICD-10-CM

## 2016-04-22 DIAGNOSIS — B351 Tinea unguium: Secondary | ICD-10-CM

## 2016-04-22 DIAGNOSIS — L89891 Pressure ulcer of other site, stage 1: Secondary | ICD-10-CM

## 2016-04-22 DIAGNOSIS — L97521 Non-pressure chronic ulcer of other part of left foot limited to breakdown of skin: Secondary | ICD-10-CM

## 2016-04-22 MED ORDER — MUPIROCIN 2 % EX OINT: TOPICAL_OINTMENT | CUTANEOUS | 2 refills | Status: DC

## 2016-04-22 NOTE — Progress Notes (Signed)
She presents today for chief complaint of painful elongated toenails. He states that I think him in trouble and she refers to sores distal aspect of her toes. She denies fever chills nausea vomiting muscle aches and pains states that she's been soaking her toes in Epsom salts and water. Denies wearing tight shoes states that she's having tenderness in her legs.  Objective: Vital signs are stable she is alert and oriented 3 pulses are slightly diminished in her toes are cool to the touch. She does have considerable venous insufficiency with hyperpigmentation of the legs and swelling along the medial aspect of the legs and anterior leg. Toes of the left foot demonstrate superficial ulcerations to the distal tuft of the hallux left and the distal tuft of the hammertoe deformity second digit left foot. Neither of which probed to bone and there is granulation tissue present and some epithelialization occurring. Her toenails are long thick yellow dystrophic and clinically mycotic.  Assessment: Peripheral vascular disease with venous insufficiency. Breakdown of skin resulting in superficial ulcerations distal aspect of the toes. No signs of infection. Pain in limb secondary to onychomycosis.  Plan: Debridement of toenails 1 through 5. Debridement of all reactive hyperkeratosis. Placed dressings today and placed sulcus pads. I have also requested vascular evaluation for arterial flow and digital pressures to make sure that we can heal these wounds. We will notify her with the results.

## 2016-04-22 NOTE — Telephone Encounter (Addendum)
-----   Message from Kristian CoveyAshley E Prevette, [facility] sent at 04/22/2016  9:50 AM EDT ----- Regarding: Vascular referrral  Please schedule pt for vascular studies and consult. Non healing ulcers - toes bilateral. Thanks! Faxed pt referral, pt clinicals and demographics to [facility].

## 2016-04-29 ENCOUNTER — Ambulatory Visit: Payer: Medicare Other | Admitting: Podiatry

## 2016-05-13 ENCOUNTER — Ambulatory Visit (INDEPENDENT_AMBULATORY_CARE_PROVIDER_SITE_OTHER): Payer: Medicare Other | Admitting: Podiatry

## 2016-05-13 DIAGNOSIS — L89891 Pressure ulcer of other site, stage 1: Secondary | ICD-10-CM | POA: Diagnosis not present

## 2016-05-13 DIAGNOSIS — L97521 Non-pressure chronic ulcer of other part of left foot limited to breakdown of skin: Secondary | ICD-10-CM

## 2016-05-13 NOTE — Progress Notes (Signed)
She presents today for follow-up of ulcerations to the first and second digits of the left foot. She states that I think they're doing better but the one on the big toe still open.  Objective: Vital signs are stable she is alert and oriented 3 she has had vascular evaluation but has had no results and her nor have we had any of the results either. Her toes appear to be doing quite well dry eschar to the plantar aspect of the second digit of left foot which I debrided today to new healthy skin. Ulceration to the hallux distal dorsal aspect left does demonstrate no signs of infection and decreased in size.  Assessment: Well-healing ulcerations left foot.  Plan: Redressed today after debridement dry sterile compressive dressings continue to do so and continue all conservative therapies follow up with her in another 3-4 weeks.

## 2016-06-03 ENCOUNTER — Encounter: Payer: Self-pay | Admitting: Podiatry

## 2016-06-03 ENCOUNTER — Ambulatory Visit (INDEPENDENT_AMBULATORY_CARE_PROVIDER_SITE_OTHER): Payer: Medicare Other | Admitting: Podiatry

## 2016-06-03 DIAGNOSIS — I739 Peripheral vascular disease, unspecified: Secondary | ICD-10-CM

## 2016-06-03 DIAGNOSIS — L89891 Pressure ulcer of other site, stage 1: Secondary | ICD-10-CM | POA: Diagnosis not present

## 2016-06-03 DIAGNOSIS — L97521 Non-pressure chronic ulcer of other part of left foot limited to breakdown of skin: Secondary | ICD-10-CM

## 2016-06-03 NOTE — Progress Notes (Signed)
She presents today for follow-up of ulceration first and second digits of the left foot. She states they seem to be doing much better.  Objective: Vital signs are stable alert and oriented 3. Pulses are palpable. Reactive hyperkeratosis distal aspect of each toe was present was debrided does demonstrate some bleeding to the distal second digit left none to the hallux. There is no signs of infection and the wounds appear to be healing nicely.  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  Plan: Debridement of wounds today applied dressing. And also re-dispensed to silicone buttress pads.

## 2016-07-13 ENCOUNTER — Ambulatory Visit (INDEPENDENT_AMBULATORY_CARE_PROVIDER_SITE_OTHER): Payer: Medicare Other | Admitting: Podiatry

## 2016-07-13 DIAGNOSIS — L97521 Non-pressure chronic ulcer of other part of left foot limited to breakdown of skin: Secondary | ICD-10-CM

## 2016-07-13 DIAGNOSIS — M79676 Pain in unspecified toe(s): Secondary | ICD-10-CM

## 2016-07-13 DIAGNOSIS — B351 Tinea unguium: Secondary | ICD-10-CM

## 2016-07-13 DIAGNOSIS — Q828 Other specified congenital malformations of skin: Secondary | ICD-10-CM

## 2016-07-13 NOTE — Progress Notes (Signed)
She presents today states that she's doing much better she refers to the ulcers on her toes.  Objective: Vital signs are stable she is alert and oriented 3. There is no erythema or edema cellulitis drainage or odor distal clavi to the toes are callused and the ulcerations have gone on to heal I debrided all reactive hyperkeratosis today I saw no signs of infection. Toenails are thick yellow dystrophic onychomycotic.  Assessment: Well-healing ulceration left foot. Pain in limb segment onychomycosis.  Plan: Debrided all reactive hyperkeratosis and ulcerations today debridement toenails today. Follow up with her in 1-2 months

## 2016-07-29 ENCOUNTER — Other Ambulatory Visit: Payer: Self-pay | Admitting: Physician Assistant

## 2016-07-29 DIAGNOSIS — R140 Abdominal distension (gaseous): Secondary | ICD-10-CM

## 2016-08-05 ENCOUNTER — Ambulatory Visit: Admission: RE | Admit: 2016-08-05 | Payer: Medicare Other | Source: Ambulatory Visit

## 2016-08-05 ENCOUNTER — Ambulatory Visit
Admission: RE | Admit: 2016-08-05 | Discharge: 2016-08-05 | Disposition: A | Discharge status: Home / Self Care | Payer: Medicare Other | Source: Ambulatory Visit | Attending: Physician Assistant | Admitting: Physician Assistant

## 2016-08-05 DIAGNOSIS — R161 Splenomegaly, not elsewhere classified: Secondary | ICD-10-CM | POA: Insufficient documentation

## 2016-08-05 DIAGNOSIS — R140 Abdominal distension (gaseous): Secondary | ICD-10-CM | POA: Diagnosis present

## 2016-08-05 DIAGNOSIS — N281 Cyst of kidney, acquired: Secondary | ICD-10-CM | POA: Diagnosis not present

## 2016-08-05 DIAGNOSIS — K8020 Calculus of gallbladder without cholecystitis without obstruction: Secondary | ICD-10-CM | POA: Diagnosis not present

## 2016-08-07 ENCOUNTER — Ambulatory Visit: Payer: Medicare Other

## 2016-09-14 ENCOUNTER — Ambulatory Visit: Payer: Medicare Other | Admitting: Podiatry

## 2016-09-14 ENCOUNTER — Ambulatory Visit (INDEPENDENT_AMBULATORY_CARE_PROVIDER_SITE_OTHER): Payer: Medicare Other | Admitting: Podiatry

## 2016-09-14 ENCOUNTER — Encounter: Payer: Self-pay | Admitting: Podiatry

## 2016-09-14 DIAGNOSIS — I739 Peripheral vascular disease, unspecified: Secondary | ICD-10-CM

## 2016-09-14 DIAGNOSIS — M2040 Other hammer toe(s) (acquired), unspecified foot: Secondary | ICD-10-CM

## 2016-09-14 DIAGNOSIS — M79676 Pain in unspecified toe(s): Secondary | ICD-10-CM

## 2016-09-14 DIAGNOSIS — B351 Tinea unguium: Secondary | ICD-10-CM

## 2016-09-14 NOTE — Progress Notes (Signed)
Complaint:  Visit Type: Patient returns to my office for continued preventative foot care services. Complaint: Patient states" my nails have grown long and thick and become painful to walk and wear shoes" Patient has been diagnosed with DM with no foot complications. The patient presents for preventative foot care services. No changes to ROS  Podiatric Exam: Vascular: dorsalis pedis and posterior tibial pulses are absent  bilateral. Capillary return is diminished . Cold feet noted.  Venous stasis feet  B/L.Marland Kitchen. Skin turgor WNL  Sensorium Diminished  Semmes Weinstein monofilament test. Normal tactile sensation bilaterally. Nail Exam: Pt has thick disfigured discolored nails with subungual debris noted bilateral entire nail hallux through fifth toenails Ulcer Exam: There is no evidence of ulcer or pre-ulcerative changes or infection. Orthopedic Exam: Muscle tone and strength are WNL. No limitations in general ROM. No crepitus or effusions noted. Foot type and digits show no abnormalities. Bony prominences are unremarkable. Skin: No Porokeratosis. No infection or ulcers  Diagnosis:  Onychomycosis, , Pain in right toe, pain in left toes Diabetes with angiopathy.  Treatment & Plan Procedures and Treatment: Consent by patient was obtained for treatment procedures. The patient understood the discussion of treatment and procedures well. All questions were answered thoroughly reviewed. Debridement of mycotic and hypertrophic toenails, 1 through 5 bilateral and clearing of subungual debris. No ulceration, no infection noted. Patient does not want diabetic shoes. Return Visit-Office Procedure: Patient instructed to return to the office for a follow up visit 3 months for continued evaluation and treatment.    Helane GuntherGregory Shadana Pry DPM

## 2016-09-15 ENCOUNTER — Telehealth: Payer: Self-pay | Admitting: *Deleted

## 2016-09-15 NOTE — Telephone Encounter (Signed)
Pt states she saw Dr. Stacie AcresMayer yesterday for a nail trim and he put a bandaid on her left big toe and didn't tell her why. Pt states she got home and her shoe was full of blood, and was bleeding this morning. I asked pt if she was on a blood thinner and she said she was on Coumadin. I told pt to soak the left foot daily in 1/4 - 1/2 Cup epsom salt to 1 Qt water for 20 minutes per session and apply antibiotic ointment to a bandaid and cover the toe, to do this for 1 week and if the area continued to bleed, or showed signs of infection to call for an appt. Pt states understanding and states she has a ointment Dr. Al CorpusHyatt prescribed, could she use that. Pt was prescribed Bactroban and I told her that would be good.

## 2016-09-16 ENCOUNTER — Other Ambulatory Visit: Payer: Self-pay | Admitting: Internal Medicine

## 2016-09-16 DIAGNOSIS — N6001 Solitary cyst of right breast: Secondary | ICD-10-CM

## 2016-10-01 ENCOUNTER — Other Ambulatory Visit: Payer: Self-pay | Admitting: Gastroenterology

## 2016-10-01 DIAGNOSIS — K7400 Hepatic fibrosis, unspecified: Secondary | ICD-10-CM

## 2016-10-02 ENCOUNTER — Ambulatory Visit
Admission: RE | Admit: 2016-10-02 | Discharge: 2016-10-02 | Disposition: A | Discharge status: Home / Self Care | Payer: Medicare Other | Source: Ambulatory Visit | Attending: Internal Medicine | Admitting: Internal Medicine

## 2016-10-02 DIAGNOSIS — N6001 Solitary cyst of right breast: Secondary | ICD-10-CM | POA: Diagnosis not present

## 2016-10-08 ENCOUNTER — Ambulatory Visit
Admission: RE | Admit: 2016-10-08 | Discharge: 2016-10-08 | Disposition: A | Discharge status: Home / Self Care | Payer: Medicare Other | Source: Ambulatory Visit | Attending: Gastroenterology | Admitting: Gastroenterology

## 2016-10-08 DIAGNOSIS — K7400 Hepatic fibrosis, unspecified: Secondary | ICD-10-CM

## 2016-10-08 DIAGNOSIS — K7460 Unspecified cirrhosis of liver: Secondary | ICD-10-CM | POA: Diagnosis not present

## 2016-10-08 DIAGNOSIS — K8020 Calculus of gallbladder without cholecystitis without obstruction: Secondary | ICD-10-CM | POA: Diagnosis not present

## 2016-10-08 DIAGNOSIS — N281 Cyst of kidney, acquired: Secondary | ICD-10-CM | POA: Insufficient documentation

## 2016-10-08 DIAGNOSIS — R161 Splenomegaly, not elsewhere classified: Secondary | ICD-10-CM | POA: Diagnosis not present

## 2016-12-14 ENCOUNTER — Ambulatory Visit (INDEPENDENT_AMBULATORY_CARE_PROVIDER_SITE_OTHER): Payer: Medicare Other | Admitting: Podiatry

## 2016-12-14 DIAGNOSIS — M79676 Pain in unspecified toe(s): Secondary | ICD-10-CM | POA: Diagnosis not present

## 2016-12-14 DIAGNOSIS — M2042 Other hammer toe(s) (acquired), left foot: Secondary | ICD-10-CM

## 2016-12-14 DIAGNOSIS — B351 Tinea unguium: Secondary | ICD-10-CM | POA: Diagnosis not present

## 2016-12-14 DIAGNOSIS — I739 Peripheral vascular disease, unspecified: Secondary | ICD-10-CM

## 2016-12-14 DIAGNOSIS — M2041 Other hammer toe(s) (acquired), right foot: Secondary | ICD-10-CM

## 2016-12-14 NOTE — Progress Notes (Addendum)
Complaint:  Visit Type: Patient returns to my office for continued preventative foot care services. Complaint: Patient states" my nails have grown long and thick and become painful to walk and wear shoes" Patient has been diagnosed with DM with no foot complications. The patient presents for preventative foot care services. No changes to ROS  Podiatric Exam: Vascular: dorsalis pedis and posterior tibial pulses are absent  bilateral. Capillary return is diminished . Cold feet noted.  Venous stasis feet  B/L.Marland Kitchen Skin turgor WNL  Sensorium  Semmes Weinstein monofilament test is diminished. Normal tactile sensation bilaterally. Nail Exam: Pt has thick disfigured discolored nails with subungual debris noted bilateral entire nail hallux through fifth toenails Ulcer Exam: There is no evidence of ulcer or pre-ulcerative changes or infection. Orthopedic Exam: Muscle tone and strength are WNL. No limitations in general ROM. No crepitus or effusions noted. Foot type and digits show no abnormalities. Bony prominences are unremarkable. Not interested in diabetic shoes. Skin: No Porokeratosis. No infection or ulcers  Diagnosis:  Onychomycosis, , Pain in right toe, pain in left toes Diabetes with angiopathy.  Treatment & Plan Procedures and Treatment: Consent by patient was obtained for treatment procedures. The patient understood the discussion of treatment and procedures well. All questions were answered thoroughly reviewed. Debridement of mycotic and hypertrophic toenails, 1 through 5 bilateral and clearing of subungual debris. No ulceration, no infection noted. Patient does not want diabetic shoes. Return Visit-Office Procedure: Patient instructed to return to the office for a follow up visit 3 months for continued evaluation and treatment.    Helane Gunther DPM

## 2017-01-28 ENCOUNTER — Other Ambulatory Visit: Payer: Self-pay | Admitting: Gastroenterology

## 2017-01-28 DIAGNOSIS — K7460 Unspecified cirrhosis of liver: Secondary | ICD-10-CM

## 2017-02-25 ENCOUNTER — Encounter: Payer: Self-pay | Admitting: *Deleted

## 2017-02-25 ENCOUNTER — Emergency Department
Admission: EM | Admit: 2017-02-25 | Discharge: 2017-02-25 | Disposition: A | Discharge status: Home / Self Care | Payer: Medicare Other | Attending: Emergency Medicine | Admitting: Emergency Medicine

## 2017-02-25 ENCOUNTER — Emergency Department: Payer: Medicare Other

## 2017-02-25 DIAGNOSIS — Z79899 Other long term (current) drug therapy: Secondary | ICD-10-CM | POA: Diagnosis not present

## 2017-02-25 DIAGNOSIS — I2510 Atherosclerotic heart disease of native coronary artery without angina pectoris: Secondary | ICD-10-CM | POA: Insufficient documentation

## 2017-02-25 DIAGNOSIS — Z7984 Long term (current) use of oral hypoglycemic drugs: Secondary | ICD-10-CM | POA: Diagnosis not present

## 2017-02-25 DIAGNOSIS — N183 Chronic kidney disease, stage 3 (moderate): Secondary | ICD-10-CM | POA: Insufficient documentation

## 2017-02-25 DIAGNOSIS — I509 Heart failure, unspecified: Secondary | ICD-10-CM | POA: Insufficient documentation

## 2017-02-25 DIAGNOSIS — I129 Hypertensive chronic kidney disease with stage 1 through stage 4 chronic kidney disease, or unspecified chronic kidney disease: Secondary | ICD-10-CM | POA: Diagnosis not present

## 2017-02-25 DIAGNOSIS — Z7901 Long term (current) use of anticoagulants: Secondary | ICD-10-CM | POA: Diagnosis not present

## 2017-02-25 DIAGNOSIS — N390 Urinary tract infection, site not specified: Secondary | ICD-10-CM | POA: Diagnosis not present

## 2017-02-25 DIAGNOSIS — E1122 Type 2 diabetes mellitus with diabetic chronic kidney disease: Secondary | ICD-10-CM | POA: Diagnosis not present

## 2017-02-25 DIAGNOSIS — R4182 Altered mental status, unspecified: Secondary | ICD-10-CM | POA: Insufficient documentation

## 2017-02-25 DIAGNOSIS — E039 Hypothyroidism, unspecified: Secondary | ICD-10-CM | POA: Diagnosis not present

## 2017-02-25 LAB — URINALYSIS, COMPLETE (UACMP) WITH MICROSCOPIC
BILIRUBIN URINE: NEGATIVE
Glucose, UA: >=500 mg/dL — AB
KETONES UR: NEGATIVE mg/dL
Nitrite: POSITIVE — AB
Protein, ur: 30 mg/dL — AB
SPECIFIC GRAVITY, URINE: 1.008 (ref 1.005–1.030)
pH: 5 (ref 5.0–8.0)

## 2017-02-25 LAB — CBC
HEMATOCRIT: 40.4 % (ref 35.0–47.0)
HEMOGLOBIN: 13.6 g/dL (ref 12.0–16.0)
MCH: 35.3 pg — ABNORMAL HIGH (ref 26.0–34.0)
MCHC: 33.8 g/dL (ref 32.0–36.0)
MCV: 104.5 fL — AB (ref 80.0–100.0)
Platelets: 101 10*3/uL — ABNORMAL LOW (ref 150–440)
RBC: 3.86 MIL/uL (ref 3.80–5.20)
RDW: 15 % — ABNORMAL HIGH (ref 11.5–14.5)
WBC: 5.9 10*3/uL (ref 3.6–11.0)

## 2017-02-25 LAB — COMPREHENSIVE METABOLIC PANEL
ALT: 29 U/L (ref 14–54)
AST: 60 U/L — ABNORMAL HIGH (ref 15–41)
Albumin: 3.9 g/dL (ref 3.5–5.0)
Alkaline Phosphatase: 135 U/L — ABNORMAL HIGH (ref 38–126)
Anion gap: 8 (ref 5–15)
BUN: 30 mg/dL — ABNORMAL HIGH (ref 6–20)
CHLORIDE: 99 mmol/L — AB (ref 101–111)
CO2: 28 mmol/L (ref 22–32)
Calcium: 9.1 mg/dL (ref 8.9–10.3)
Creatinine, Ser: 1.73 mg/dL — ABNORMAL HIGH (ref 0.44–1.00)
GFR, EST AFRICAN AMERICAN: 31 mL/min — AB (ref >60)
GFR, EST NON AFRICAN AMERICAN: 27 mL/min — AB (ref >60)
Glucose, Bld: 241 mg/dL — ABNORMAL HIGH (ref 65–99)
POTASSIUM: 4.2 mmol/L (ref 3.5–5.1)
SODIUM: 135 mmol/L (ref 135–145)
Total Bilirubin: 1.1 mg/dL (ref 0.3–1.2)
Total Protein: 6.9 g/dL (ref 6.5–8.1)

## 2017-02-25 LAB — LACTIC ACID, PLASMA: LACTIC ACID, VENOUS: 1.4 mmol/L (ref 0.5–1.9)

## 2017-02-25 MED ORDER — CEPHALEXIN 500 MG PO CAPS: 500.0000 mg | ORAL_CAPSULE | Freq: Four times a day (QID) | ORAL | 0 refills | Status: AC

## 2017-02-25 MED ORDER — DEXTROSE 5 % IV SOLN
2.0000 g | Freq: Once | INTRAVENOUS | Status: AC
  Administered 2017-02-25: 2 g via INTRAVENOUS
  Filled 2017-02-25: qty 2

## 2017-02-25 NOTE — ED Notes (Signed)
Patient transported to CT 

## 2017-02-25 NOTE — Discharge Instructions (Signed)
you have a urinary tract infection again I will give you Keflex 14 times a day that should take care of it. Please return if you're worse or no better in 2 days or follow-up with your doctor.

## 2017-02-25 NOTE — ED Triage Notes (Signed)
Patient's daughter states patient appears more confused intermittently over the last few days and states patient has a history of UTIs. Patient c/o several episodes of diarrhea in the last two days. Patient is alert and oriented x4 in triage. Patient c/o being chilled.

## 2017-02-25 NOTE — ED Provider Notes (Signed)
[facility] Emergency Department Provider Note   ____________________________________________   First MD Initiated Contact with Patient 02/25/17 1844     (approximate)  I have reviewed the triage vital signs and the nursing notes.   HISTORY  Chief Complaint Altered Mental Status   HPI Fredrik RiggerJean S Monacelli is a 76 y.o. female who reports she feels fine. Patient's daughter says patient has been intermittently more confused than usual in the last few days especially today. She's had some diarrhea and complains of being chilled. She also has had a history of UTIs in the past. Patient has complained of some achiness in her feet and ankles as well. They took a large tick off of her several days ago as well.  Past Medical History:  Diagnosis Date  . Atrial fibrillation (HCC)   . Atrial flutter (HCC)   . CKD (chronic kidney disease) stage 3, GFR 30-59 ml/min   . Coronary disease   . Dehydration   . Diabetes (HCC)   . Fracture of humeral shaft, right, closed   . Gout   . Hypertension   . Hypothyroidism   . Peripheral neuropathy   . Renal insufficiency   . Right renal artery stenosis (HCC)   . Syncope and collapse   . Vitiligo     Patient Active Problem List   Diagnosis Date Noted  . Hypoxia 04/29/2015  . Chronic kidney disease (CKD), stage III (moderate) 03/07/2015  . ARF (acute renal failure) (HCC) 01/25/2015  . Fracture of humeral shaft, right, closed 01/25/2015  . Acute renal failure (HCC) 01/25/2015  . Closed fracture of humerus, shaft 01/25/2015  . Congestive heart failure (HCC) 09/25/2014  . AI (aortic incompetence) 02/07/2014  . Edema leg 02/07/2014  . History of cardiac catheterization 02/07/2014  . Renal artery stenosis (HCC) 02/07/2014  . H/O coronary artery bypass surgery 02/07/2014  . Atrial fibrillation (HCC) 01/04/2014  . Arteriosclerosis of coronary artery 01/04/2014  . Type 2 diabetes mellitus (HCC) 01/04/2014  . BP (high blood  pressure) 01/04/2014  . HLD (hyperlipidemia) 01/04/2014  . Adult hypothyroidism 01/04/2014  . Neuropathy 01/04/2014    Past Surgical History:  Procedure Laterality Date  . BREAST BIOPSY Right 10/2000   negative for cancer  . BREAST EXCISIONAL BIOPSY Bilateral 1970's   neg    Prior to Admission medications   Medication Sig Start Date End Date Taking? Authorizing Provider  acetaminophen (TYLENOL) 500 MG tablet Take by mouth.    [provider]  allopurinol (ZYLOPRIM) 100 MG tablet  08/11/15   [provider]  allopurinol (ZYLOPRIM) 300 MG tablet  09/07/16   [provider]  amoxicillin (AMOXIL) 875 MG tablet  08/18/16   [provider]  cephALEXin (KEFLEX) 500 MG capsule Take 1 capsule (500 mg total) by mouth 4 (four) times daily. 02/25/17 03/07/17  Arnaldo NatalMalinda, Mustaf Antonacci F, MD  Cholecalciferol (VITAMIN D) 2000 UNITS CAPS Take 2,000 Units by mouth daily.    [provider]  cloNIDine (CATAPRES) 0.1 MG tablet Take 0.1 mg by mouth every 8 (eight) hours.     [provider]  clotrimazole-betamethasone (LOTRISONE) cream Apply topically. 04/01/16 04/01/17  [provider]  cyanocobalamin 500 MCG tablet Take 500 mcg by mouth daily.    [provider]  docusate sodium (COLACE) 100 MG capsule Take 1 capsule (100 mg total) by mouth 2 (two) times daily. 01/28/15   Barbette ReichmannHande, Vishwanath, MD  doxycycline (ADOXA) 100 MG tablet Take 100 mg by mouth 2 (two) times  daily.    [provider]  ferrous sulfate 325 (65 FE) MG tablet Take 325 mg by mouth daily with breakfast.    [provider]  furosemide (LASIX) 40 MG tablet Take 1 tablet (40 mg total) by mouth 2 (two) times daily. 01/28/15   Barbette Reichmann, MD  gabapentin (NEURONTIN) 800 MG tablet Take 800 mg by mouth every 8 (eight) hours.     [provider]  glipiZIDE (GLUCOTROL) 10 MG tablet Take 10 mg by mouth 2 (two) times daily before a meal.    [provider]    HYDROcodone-acetaminophen (NORCO/VICODIN) 5-325 MG per tablet Take 1 tablet by mouth every 6 (six) hours as needed for moderate pain. 05/02/15   Barbette Reichmann, MD  isosorbide mononitrate (ISMO,MONOKET) 20 MG tablet  09/07/16   [provider]  levothyroxine (SYNTHROID, LEVOTHROID) 100 MCG tablet Take 100 mcg by mouth daily before breakfast.    [provider]  metoprolol succinate (TOPROL-XL) 25 MG 24 hr tablet  09/07/16   [provider]  mupirocin ointment (BACTROBAN) 2 % Apply to wound twice a day. 04/22/16   Hyatt, Max T, DPM  nitrofurantoin, macrocrystal-monohydrate, (MACROBID) 100 MG capsule take 1 capsule by mouth twice a day for 7 days 07/03/16   [provider]  omeprazole (PRILOSEC) 20 MG capsule Take 20 mg by mouth 2 (two) times daily.    [provider]  ONE TOUCH ULTRA TEST test strip  08/12/16   [provider]  potassium chloride SA (K-DUR,KLOR-CON) 20 MEQ tablet Take 20 mEq by mouth daily.    [provider]  PREVNAR 13 SUSP injection inject 0.5 milliliter intramuscularly 07/10/16   [provider]  rOPINIRole (REQUIP) 0.5 MG tablet Take 0.5 mg by mouth at bedtime.    [provider]  senna-docusate (SENOKOT-S) 8.6-50 MG per tablet Take 1 tablet by mouth at bedtime as needed for mild constipation. 05/02/15   Barbette Reichmann, MD  simvastatin (ZOCOR) 40 MG tablet Take 40 mg by mouth at bedtime.     [provider]  warfarin (COUMADIN) 1 MG tablet Take 1 tablet (1 mg total) by mouth daily at 6 PM. 05/02/15   Barbette Reichmann, MD  ZOSTAVAX 16109 UNT/0.65ML injection inject contents of 1 vial subcutaneously 07/10/16   [provider]    Allergies Ciprofloxacin; Fosamax [alendronate sodium]; and Iron  Family History  Problem Relation Age of Onset  . Gout Other   . Stroke Other   . Arthritis Other   . Osteoporosis Other     Social History Social History  Substance Use Topics  .  Smoking status: Never Smoker  . Smokeless tobacco: Never Used  . Alcohol use No    Review of Systems  Constitutional: Patient complains of subjective fever/chills Eyes: No visual changes. ENT: No sore throat. Cardiovascular: Denies chest pain. Respiratory: Denies shortness of breath. Gastrointestinal: No abdominal pain.  No nausea, no vomiting.  No diarrhea.  No constipation. Genitourinary: Negative for dysuria. Musculoskeletal: Negative for back pain. Skin: Negative for rash. Neurological: Negative for headaches, focal weakness or numbness.   ____________________________________________   PHYSICAL EXAM:  VITAL SIGNS: ED Triage Vitals  Enc Vitals Group     BP 02/25/17 1802 (!) 177/99     Pulse Rate 02/25/17 1802 62     Resp 02/25/17 1802 14     Temp 02/25/17 1802 98.6 F (37 C)     Temp Source 02/25/17 1802 Oral     SpO2 02/25/17  1802 98 %     Weight 02/25/17 1803 219 lb (99.3 kg)     Height 02/25/17 1803 5\' 3"  (1.6 m)     Head Circumference --      Peak Flow --      Pain Score --      Pain Loc --      Pain Edu? --      Excl. in GC? --     Constitutional: Alert and oriented. Well appearing and in no acute distress. Eyes: Conjunctivae are normal. PERRL. EOMI. Head: Atraumatic. Nose: No congestion/rhinnorhea. Mouth/Throat: Mucous membranes are moist.  Oropharynx non-erythematous. Neck: No stridor. Cardiovascular: Normal rate, regular rhythm. Grossly normal heart sounds.  Good peripheral circulation. Respiratory: Normal respiratory effort.  No retractions. Lungs CTAB. Gastrointestinal: Soft and nontender. No distention. No abdominal bruits. No CVA tenderness. Musculoskeletal: No lower extremity tenderness nor edema.  No joint effusions. Neurologic:  Normal speech and language. No gross focal neurologic deficits are appreciated.Cranial nerves II through XII are intact although visual fields were not checked cerebellar finger-nose and rapid alternating movements and  finger show normal motor strength is 5 over 5 throughout sensation is intact Skin:  Skin is warm, dry and intact. There are marked venous stasis changes in both legs. Psychiatric: Mood and affect are normal. Speech and behavior are normal.  ____________________________________________   LABS (all labs ordered are listed, but only abnormal results are displayed)  Labs Reviewed  COMPREHENSIVE METABOLIC PANEL - Abnormal; Notable for the following:       Result Value   Chloride 99 (*)    Glucose, Bld 241 (*)    BUN 30 (*)    Creatinine, Ser 1.73 (*)    AST 60 (*)    Alkaline Phosphatase 135 (*)    GFR calc non Af Amer 27 (*)    GFR calc Af Amer 31 (*)    All other components within normal limits  CBC - Abnormal; Notable for the following:    MCV 104.5 (*)    MCH 35.3 (*)    RDW 15.0 (*)    Platelets 101 (*)    All other components within normal limits  URINALYSIS, COMPLETE (UACMP) WITH MICROSCOPIC - Abnormal; Notable for the following:    Color, Urine YELLOW (*)    APPearance HAZY (*)    Glucose, UA >=500 (*)    Hgb urine dipstick MODERATE (*)    Protein, ur 30 (*)    Nitrite POSITIVE (*)    Leukocytes, UA MODERATE (*)    Bacteria, UA MANY (*)    Squamous Epithelial / LPF 0-5 (*)    All other components within normal limits  GASTROINTESTINAL PANEL BY PCR, STOOL (REPLACES STOOL CULTURE)  C DIFFICILE QUICK SCREEN W PCR REFLEX  LACTIC ACID, PLASMA  TROPONIN I  LACTIC ACID, PLASMA  CBC WITH DIFFERENTIAL/PLATELET  CBG MONITORING, ED   ____________________________________________  EKG   ____________________________________________  RADIOLOGY IMPRESSION: 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 No acute intracranial abnormality.   Electronically Signed   By: Tollie Eth M.D.   On: 02/25/2017 19:21   ____IMPRESSION: 1. Stable cardiomegaly and atherosclerotic tortuous thoracic aorta. 2. Possible blunting of left costophrenic angle may be  small effusion versus soft tissue attenuation from habitus.   Electronically Signed   By: Rubye Oaks M.D.________________________________________   PROCEDURES  Procedure(s) performed:   Procedures  Critical Care performed:   ____________________________________________   INITIAL IMPRESSION / ASSESSMENT AND PLAN / ED COURSE  Pertinent labs & imaging results that were available during my care of the patient were reviewed by me and considered in my medical decision making (see chart for details).  Patient continues to look well has UTI      ____________________________________________   FINAL CLINICAL IMPRESSION(S) / ED DIAGNOSES  Final diagnoses:  Lower urinary tract infectious disease      NEW MEDICATIONS STARTED DURING THIS VISIT:  New Prescriptions   CEPHALEXIN (KEFLEX) 500 MG CAPSULE    Take 1 capsule (500 mg total) by mouth 4 (four) times daily.     Note:  This document was prepared using Dragon voice recognition software and may include unintentional dictation errors.    Arnaldo Natal, MD 02/25/17 2113

## 2017-03-15 ENCOUNTER — Ambulatory Visit (INDEPENDENT_AMBULATORY_CARE_PROVIDER_SITE_OTHER): Payer: Medicare Other | Admitting: Podiatry

## 2017-03-15 DIAGNOSIS — B351 Tinea unguium: Secondary | ICD-10-CM

## 2017-03-15 DIAGNOSIS — E0852 Diabetes mellitus due to underlying condition with diabetic peripheral angiopathy with gangrene: Secondary | ICD-10-CM

## 2017-03-15 DIAGNOSIS — I739 Peripheral vascular disease, unspecified: Secondary | ICD-10-CM

## 2017-03-15 DIAGNOSIS — Z794 Long term (current) use of insulin: Secondary | ICD-10-CM

## 2017-03-15 DIAGNOSIS — M79676 Pain in unspecified toe(s): Secondary | ICD-10-CM | POA: Diagnosis not present

## 2017-03-15 NOTE — Progress Notes (Signed)
Complaint:  Visit Type: Patient returns to my office for continued preventative foot care services. Complaint: Patient states" my nails have grown long and thick and become painful to walk and wear shoes" Patient has been diagnosed with DM with no foot complications. The patient presents for preventative foot care services. No changes to ROS  Podiatric Exam: Vascular: dorsalis pedis and posterior tibial pulses are absent  bilateral. Capillary return is diminished . Cold feet noted.  Venous stasis feet  B/L.Marland Kitchen. Skin turgor WNL  Sensorium  Semmes Weinstein monofilament test is diminished. Normal tactile sensation bilaterally. Nail Exam: Pt has thick disfigured discolored nails with subungual debris noted bilateral entire nail hallux through fifth toenails Ulcer Exam: There is no evidence of ulcer or pre-ulcerative changes or infection. Orthopedic Exam: Muscle tone and strength are WNL. No limitations in general ROM. No crepitus or effusions noted. Foot type and digits show no abnormalities. Bony prominences are unremarkable. Not interested in diabetic shoes. Skin: No Porokeratosis. No infection or ulcers  Diagnosis:  Onychomycosis, , Pain in right toe, pain in left toes Diabetes with angiopathy.  Treatment & Plan Procedures and Treatment: Consent by patient was obtained for treatment procedures. The patient understood the discussion of treatment and procedures well. All questions were answered thoroughly reviewed. Debridement of mycotic and hypertrophic toenails, 1 through 5 bilateral and clearing of subungual debris. No ulceration, no infection noted.  Return Visit-Office Procedure: Patient instructed to return to the office for a follow up visit 3 months for continued evaluation and treatment.    Helane GuntherGregory Isiah Scheel DPM

## 2017-03-24 ENCOUNTER — Ambulatory Visit
Admission: RE | Admit: 2017-03-24 | Discharge: 2017-03-24 | Disposition: A | Discharge status: Home / Self Care | Payer: Medicare Other | Source: Ambulatory Visit | Attending: Gastroenterology | Admitting: Gastroenterology

## 2017-06-09 ENCOUNTER — Encounter: Payer: Self-pay | Admitting: *Deleted

## 2017-06-10 ENCOUNTER — Ambulatory Visit
Admission: RE | Admit: 2017-06-10 | Discharge: 2017-06-10 | Disposition: A | Discharge status: Home / Self Care | Payer: Medicare Other | Source: Ambulatory Visit | Attending: Gastroenterology | Admitting: Gastroenterology

## 2017-06-10 ENCOUNTER — Ambulatory Visit: Payer: Medicare Other | Admitting: Anesthesiology

## 2017-06-10 ENCOUNTER — Encounter
Admission: RE | Disposition: A | Discharge status: Home / Self Care | Payer: Self-pay | Source: Ambulatory Visit | Attending: Gastroenterology

## 2017-06-10 ENCOUNTER — Encounter: Payer: Self-pay | Admitting: *Deleted

## 2017-06-10 DIAGNOSIS — M109 Gout, unspecified: Secondary | ICD-10-CM | POA: Insufficient documentation

## 2017-06-10 DIAGNOSIS — I130 Hypertensive heart and chronic kidney disease with heart failure and stage 1 through stage 4 chronic kidney disease, or unspecified chronic kidney disease: Secondary | ICD-10-CM | POA: Insufficient documentation

## 2017-06-10 DIAGNOSIS — I509 Heart failure, unspecified: Secondary | ICD-10-CM | POA: Diagnosis not present

## 2017-06-10 DIAGNOSIS — K7581 Nonalcoholic steatohepatitis (NASH): Secondary | ICD-10-CM | POA: Diagnosis not present

## 2017-06-10 DIAGNOSIS — I2510 Atherosclerotic heart disease of native coronary artery without angina pectoris: Secondary | ICD-10-CM | POA: Insufficient documentation

## 2017-06-10 DIAGNOSIS — K7469 Other cirrhosis of liver: Secondary | ICD-10-CM | POA: Insufficient documentation

## 2017-06-10 DIAGNOSIS — Z7901 Long term (current) use of anticoagulants: Secondary | ICD-10-CM | POA: Diagnosis not present

## 2017-06-10 DIAGNOSIS — I8510 Secondary esophageal varices without bleeding: Secondary | ICD-10-CM | POA: Insufficient documentation

## 2017-06-10 DIAGNOSIS — E1140 Type 2 diabetes mellitus with diabetic neuropathy, unspecified: Secondary | ICD-10-CM | POA: Insufficient documentation

## 2017-06-10 DIAGNOSIS — E1122 Type 2 diabetes mellitus with diabetic chronic kidney disease: Secondary | ICD-10-CM | POA: Insufficient documentation

## 2017-06-10 DIAGNOSIS — Z23 Encounter for immunization: Secondary | ICD-10-CM | POA: Insufficient documentation

## 2017-06-10 DIAGNOSIS — K2960 Other gastritis without bleeding: Secondary | ICD-10-CM | POA: Diagnosis not present

## 2017-06-10 DIAGNOSIS — Z881 Allergy status to other antibiotic agents status: Secondary | ICD-10-CM | POA: Diagnosis not present

## 2017-06-10 DIAGNOSIS — K224 Dyskinesia of esophagus: Secondary | ICD-10-CM | POA: Diagnosis not present

## 2017-06-10 DIAGNOSIS — I4891 Unspecified atrial fibrillation: Secondary | ICD-10-CM | POA: Insufficient documentation

## 2017-06-10 DIAGNOSIS — I4892 Unspecified atrial flutter: Secondary | ICD-10-CM | POA: Insufficient documentation

## 2017-06-10 DIAGNOSIS — E039 Hypothyroidism, unspecified: Secondary | ICD-10-CM | POA: Insufficient documentation

## 2017-06-10 DIAGNOSIS — I739 Peripheral vascular disease, unspecified: Secondary | ICD-10-CM | POA: Insufficient documentation

## 2017-06-10 DIAGNOSIS — N183 Chronic kidney disease, stage 3 (moderate): Secondary | ICD-10-CM | POA: Insufficient documentation

## 2017-06-10 DIAGNOSIS — Z888 Allergy status to other drugs, medicaments and biological substances status: Secondary | ICD-10-CM | POA: Diagnosis not present

## 2017-06-10 DIAGNOSIS — L80 Vitiligo: Secondary | ICD-10-CM | POA: Insufficient documentation

## 2017-06-10 DIAGNOSIS — I8500 Esophageal varices without bleeding: Secondary | ICD-10-CM | POA: Diagnosis present

## 2017-06-10 DIAGNOSIS — Z955 Presence of coronary angioplasty implant and graft: Secondary | ICD-10-CM | POA: Diagnosis not present

## 2017-06-10 DIAGNOSIS — K7460 Unspecified cirrhosis of liver: Secondary | ICD-10-CM | POA: Diagnosis present

## 2017-06-10 HISTORY — DX: Heart failure, unspecified: I50.9

## 2017-06-10 HISTORY — PX: ESOPHAGOGASTRODUODENOSCOPY (EGD) WITH PROPOFOL: SHX5813

## 2017-06-10 LAB — CBC WITH DIFFERENTIAL/PLATELET
BASOS PCT: 1 %
Basophils Absolute: 0 10*3/uL (ref 0–0.1)
EOS PCT: 1 %
Eosinophils Absolute: 0.1 10*3/uL (ref 0–0.7)
HEMATOCRIT: 41.9 % (ref 35.0–47.0)
Hemoglobin: 14 g/dL (ref 12.0–16.0)
Lymphocytes Relative: 21 %
Lymphs Abs: 0.8 10*3/uL — ABNORMAL LOW (ref 1.0–3.6)
MCH: 35.5 pg — ABNORMAL HIGH (ref 26.0–34.0)
MCHC: 33.5 g/dL (ref 32.0–36.0)
MCV: 105.9 fL — ABNORMAL HIGH (ref 80.0–100.0)
MONO ABS: 0.4 10*3/uL (ref 0.2–0.9)
MONOS PCT: 9 %
NEUTROS ABS: 2.8 10*3/uL (ref 1.4–6.5)
Neutrophils Relative %: 68 %
PLATELETS: 77 10*3/uL — AB (ref 150–440)
RBC: 3.95 MIL/uL (ref 3.80–5.20)
RDW: 14.8 % — AB (ref 11.5–14.5)
WBC: 4.1 10*3/uL (ref 3.6–11.0)

## 2017-06-10 LAB — GLUCOSE, CAPILLARY: Glucose-Capillary: 290 mg/dL — ABNORMAL HIGH (ref 65–99)

## 2017-06-10 LAB — PROTIME-INR
INR: 1.13
PROTHROMBIN TIME: 14.4 s (ref 11.4–15.2)

## 2017-06-10 SURGERY — ESOPHAGOGASTRODUODENOSCOPY (EGD) WITH PROPOFOL
Anesthesia: General

## 2017-06-10 MED ORDER — FENTANYL CITRATE (PF) 100 MCG/2ML IJ SOLN
INTRAMUSCULAR | Status: AC
  Filled 2017-06-10: qty 2

## 2017-06-10 MED ORDER — FENTANYL CITRATE (PF) 100 MCG/2ML IJ SOLN
INTRAMUSCULAR | Status: DC | PRN
  Administered 2017-06-10: 50 ug via INTRAVENOUS

## 2017-06-10 MED ORDER — BUTAMBEN-TETRACAINE-BENZOCAINE 2-2-14 % EX AERO
INHALATION_SPRAY | CUTANEOUS | Status: DC | PRN
  Administered 2017-06-10: 2 via TOPICAL

## 2017-06-10 MED ORDER — SODIUM CHLORIDE 0.9 % IV SOLN
INTRAVENOUS | Status: DC
  Administered 2017-06-10: 1000 mL via INTRAVENOUS

## 2017-06-10 MED ORDER — LIDOCAINE HCL (PF) 2 % IJ SOLN
INTRAMUSCULAR | Status: AC
  Filled 2017-06-10: qty 10

## 2017-06-10 MED ORDER — PROPOFOL 500 MG/50ML IV EMUL
INTRAVENOUS | Status: AC
  Filled 2017-06-10: qty 50

## 2017-06-10 MED ORDER — PROPOFOL 500 MG/50ML IV EMUL
INTRAVENOUS | Status: DC | PRN
  Administered 2017-06-10: 100 ug/kg/min via INTRAVENOUS

## 2017-06-10 MED ORDER — SODIUM CHLORIDE 0.9 % IV SOLN: INTRAVENOUS | Status: DC

## 2017-06-10 NOTE — Anesthesia Procedure Notes (Signed)
Performed by: COOK-MARTIN, Treyvon Blahut Pre-anesthesia Checklist: Patient identified, Emergency Drugs available, Suction available, Patient being monitored and Timeout performed Patient Re-evaluated:Patient Re-evaluated prior to induction Oxygen Delivery Method: Nasal cannula Preoxygenation: Pre-oxygenation with 100% oxygen Induction Type: IV induction Airway Equipment and Method: Bite block Placement Confirmation: CO2 detector and positive ETCO2       

## 2017-06-10 NOTE — Anesthesia Preprocedure Evaluation (Signed)
Anesthesia Evaluation  Patient identified by MRN, date of birth, ID band Patient awake    Reviewed: Allergy & Precautions, H&P , NPO status , Patient's Chart, lab work & pertinent test results  History of Anesthesia Complications Negative for: history of anesthetic complications  Airway Mallampati: III  TM Distance: <3 FB Neck ROM: limited    Dental  (+) Poor Dentition, Chipped   Pulmonary neg pulmonary ROS, neg shortness of breath,           Cardiovascular Exercise Tolerance: Good hypertension, + CAD, + Cardiac Stents, + Peripheral Vascular Disease and +CHF       Neuro/Psych  Neuromuscular disease negative psych ROS   GI/Hepatic negative GI ROS, Neg liver ROS,   Endo/Other  diabetes, Type 2, Insulin DependentHypothyroidism   Renal/GU Renal disease     Musculoskeletal   Abdominal   Peds  Hematology negative hematology ROS (+)   Anesthesia Other Findings Past Medical History: No date: Atrial fibrillation (HCC) No date: Atrial flutter (HCC) No date: CHF (congestive heart failure) (HCC) No date: CKD (chronic kidney disease) stage 3, GFR 30-59 ml/min (HCC) No date: Coronary disease No date: Dehydration No date: Diabetes (HCC) No date: Fracture of humeral shaft, right, closed No date: Gout No date: Hypertension No date: Hypothyroidism No date: Peripheral neuropathy No date: Renal insufficiency No date: Right renal artery stenosis (HCC) No date: Syncope and collapse No date: Vitiligo  Past Surgical History: 10/2000: BREAST BIOPSY; Right     Comment:  negative for cancer 1970's: BREAST EXCISIONAL BIOPSY; Bilateral     Comment:  neg No date: CARDIAC CATHETERIZATION No date: CORONARY ARTERY BYPASS GRAFT No date: FRACTURE SURGERY  BMI    Body Mass Index:  39.15 kg/m      Reproductive/Obstetrics negative OB ROS                             Anesthesia Physical Anesthesia  Plan  ASA: IV  Anesthesia Plan: General   Post-op Pain Management:    Induction: Intravenous  PONV Risk Score and Plan: Propofol infusion  Airway Management Planned: Natural Airway and Nasal Cannula  Additional Equipment:   Intra-op Plan:   Post-operative Plan:   Informed Consent: I have reviewed the patients History and Physical, chart, labs and discussed the procedure including the risks, benefits and alternatives for the proposed anesthesia with the patient or authorized representative who has indicated his/her understanding and acceptance.   Dental Advisory Given  Plan Discussed with: Anesthesiologist, CRNA and Surgeon  Anesthesia Plan Comments: (Patient consented for risks of anesthesia including but not limited to:  - adverse reactions to medications - risk of intubation if required - damage to teeth, lips or other oral mucosa - sore throat or hoarseness - Damage to heart, brain, lungs or loss of life  Patient voiced understanding.)        Anesthesia Quick Evaluation

## 2017-06-10 NOTE — Anesthesia Postprocedure Evaluation (Signed)
Anesthesia Post Note  Patient: Fredrik RiggerJean S Spare  Procedure(s) Performed: ESOPHAGOGASTRODUODENOSCOPY (EGD) WITH PROPOFOL (N/A )  Patient location during evaluation: Endoscopy Anesthesia Type: General Level of consciousness: awake and alert Pain management: pain level controlled Vital Signs Assessment: post-procedure vital signs reviewed and stable Respiratory status: spontaneous breathing, nonlabored ventilation, respiratory function stable and patient connected to nasal cannula oxygen Cardiovascular status: blood pressure returned to baseline and stable Postop Assessment: no apparent nausea or vomiting Anesthetic complications: no     Last Vitals:  Vitals:   06/10/17 1040 06/10/17 1050  BP: (!) 153/95 (!) 138/95  Pulse: 74 72  Resp: 16 16  Temp:    SpO2: 100% 95%    Last Pain:  Vitals:   06/10/17 1010  TempSrc: Tympanic                 Cleda MccreedyJoseph K Alyxis Grippi

## 2017-06-10 NOTE — Anesthesia Post-op Follow-up Note (Signed)
Anesthesia QCDR form completed.        

## 2017-06-10 NOTE — Transfer of Care (Signed)
Immediate Anesthesia Transfer of Care Note  Patient: Fredrik RiggerJean S Fleischhacker  Procedure(s) Performed: ESOPHAGOGASTRODUODENOSCOPY (EGD) WITH PROPOFOL (N/A )  Patient Location: PACU  Anesthesia Type:General  Level of Consciousness: awake and sedated  Airway & Oxygen Therapy: Patient Spontanous Breathing and Patient connected to nasal cannula oxygen  Post-op Assessment: Report given to RN and Post -op Vital signs reviewed and stable  Post vital signs: Reviewed and stable  Last Vitals:  Vitals:   06/10/17 0855  BP: (!) 142/77  Pulse: 84  Resp: 18  Temp: (!) 35.9 C  SpO2: 98%    Last Pain:  Vitals:   06/10/17 0855  TempSrc: Tympanic         Complications: No apparent anesthesia complications

## 2017-06-10 NOTE — Op Note (Signed)
[facility] Gastroenterology Patient Name: Haley NewnessJean Havel Procedure Date: 06/10/2017 9:57 AM MRN: [id number] Account #: [id number] Date of Birth: 05-19-1941 Admit Type: Outpatient Age: [age] Room: [facility] ENDO ROOM 1 Gender: Female Note Status: Finalized Procedure:            Upper GI endoscopy Indications:          Cirrhosis rule out esophageal varices Providers:            Christena DeemMartin U. Zebulin Siegel, MD Referring MD:         Barbette ReichmannVishwanath Hande, MD (Referring MD) Medicines:            Monitored Anesthesia Care Complications:        No immediate complications. Procedure:            Pre-Anesthesia Assessment:                       - ASA Grade Assessment: III - A patient with severe                        systemic disease.                       After obtaining informed consent, the endoscope was                        passed under direct vision. Throughout the procedure,                        the patient's blood pressure, pulse, and oxygen                        saturations were monitored continuously. The Endoscope                        was introduced through the mouth, and advanced to the                        third part of duodenum. The upper GI endoscopy was                        accomplished without difficulty. The patient tolerated                        the procedure well. Findings:      Grade II varices were found in the distal esophagus. They were small in       size. No red marks or evidence of bleeding.      Abnormal motility was noted in the middle third of the esophagus and in       the lower third of the esophagus. The cricopharyngeus was normal. There       are extra peristaltic waves in the esophageal body. The distal       esophagus/lower esophageal sphincter is patulous. Tertiary peristaltic       waves are noted.      Diffuse mild inflammation characterized by erythema was found in the       gastric body and in the gastric antrum.      The cardia and gastric  fundus were normal on retroflexion otherwise.       There is no evidence of gastric varices. Impression:           -  Grade II esophageal varices.                       - Abnormal esophageal motility, suspicious for                        presbyesophagus.                       - Bile gastritis.                       - No specimens collected. Recommendation:       - Discharge patient to home.                       - Return to GI clinic as previously scheduled. Procedure Code(s):    --- Professional ---                       [phone], Esophagogastroduodenoscopy, flexible, transoral;                        diagnostic, including collection of specimen(s) by                        brushing or washing, when performed (separate procedure) Diagnosis Code(s):    --- Professional ---                       K74.60, Unspecified cirrhosis of liver                       I85.10, Secondary esophageal varices without bleeding                       K22.4, Dyskinesia of esophagus                       K29.60, Other gastritis without bleeding CPT copyright 2016 American Medical Association. All rights reserved. The codes documented in this report are preliminary and upon coder review may  be revised to meet current compliance requirements. Christena Deem, MD 06/10/2017 10:25:12 AM This report has been signed electronically. Number of Addenda: 0 Note Initiated On: 06/10/2017 9:57 AM      [facility]

## 2017-06-10 NOTE — H&P (Signed)
Outpatient short stay form Pre-procedure 06/10/2017 9:50 AM Haley DeemMartin U Latana Colin MD  Primary Physician: Dr Barbette ReichmannVishwanath Hande  Reason for visit:  EGD  History of present illness:  Patient is a 76 year old female presenting today as above. She has a recent history of diagnosis with cirrhosis of the liver likely secondary to [place]. She is presenting today for variceal screening. She did have a EGD on 11/18/2012 as well as a colonoscopy at that time. She did have her CBC and pro time check today with a platelet count of 77 and an INR 1.13. She has held her Coumadin for several days. She takes no other blood thinning agents or aspirin product.    Current Facility-Administered Medications:  .  0.9 %  sodium chloride infusion, , Intravenous, Continuous, Haley DeemSkulskie, Tarun Patchell U, MD .  0.9 %  sodium chloride infusion, , Intravenous, Continuous, Haley DeemSkulskie, Junior Huezo U, MD  Prescriptions Prior to Admission  Medication Sig Dispense Refill Last Dose  . cloNIDine (CATAPRES) 0.1 MG tablet Take 0.1 mg by mouth every 8 (eight) hours.    06/10/2017 at Unknown time  . cyanocobalamin 500 MCG tablet Take 500 mcg by mouth daily.   06/10/2017 at Unknown time  . furosemide (LASIX) 40 MG tablet Take 1 tablet (40 mg total) by mouth 2 (two) times daily. 60 tablet 3 06/10/2017 at Unknown time  . gabapentin (NEURONTIN) 800 MG tablet Take 800 mg by mouth every 8 (eight) hours.    06/10/2017 at Unknown time  . glipiZIDE (GLUCOTROL) 10 MG tablet Take 10 mg by mouth 2 (two) times daily before a meal.   06/10/2017 at Unknown time  . isosorbide mononitrate (ISMO,MONOKET) 20 MG tablet    06/10/2017 at Unknown time  . levothyroxine (SYNTHROID, LEVOTHROID) 100 MCG tablet Take 100 mcg by mouth daily before breakfast.   06/10/2017 at Unknown time  . metoprolol succinate (TOPROL-XL) 25 MG 24 hr tablet    06/10/2017 at Unknown time  . omeprazole (PRILOSEC) 20 MG capsule Take 20 mg by mouth 2 (two) times daily.   06/10/2017 at Unknown time  .  potassium chloride SA (K-DUR,KLOR-CON) 20 MEQ tablet Take 20 mEq by mouth daily.   06/10/2017 at Unknown time  . simvastatin (ZOCOR) 40 MG tablet Take 40 mg by mouth at bedtime.    06/10/2017 at Unknown time  . ZOSTAVAX [id number] UNT/0.65ML injection inject contents of 1 vial subcutaneously  0 Past Week at Unknown time  . acetaminophen (TYLENOL) 500 MG tablet Take by mouth.     Marland Kitchen. allopurinol (ZYLOPRIM) 100 MG tablet   0   . allopurinol (ZYLOPRIM) 300 MG tablet      . Cholecalciferol (VITAMIN D) 2000 UNITS CAPS Take 2,000 Units by mouth daily.   Taking  . docusate sodium (COLACE) 100 MG capsule Take 1 capsule (100 mg total) by mouth 2 (two) times daily. 10 capsule 0 Taking  . doxycycline (ADOXA) 100 MG tablet Take 100 mg by mouth 2 (two) times daily.     . ferrous sulfate 325 (65 FE) MG tablet Take 325 mg by mouth daily with breakfast.   Taking  . HYDROcodone-acetaminophen (NORCO/VICODIN) 5-325 MG per tablet Take 1 tablet by mouth every 6 (six) hours as needed for moderate pain. 30 tablet 0 Taking  . mupirocin ointment (BACTROBAN) 2 % Apply to wound twice a day. 30 g 2   . nitrofurantoin, macrocrystal-monohydrate, (MACROBID) 100 MG capsule take 1 capsule by mouth twice a day for 7 days  0   . ONE  TOUCH ULTRA TEST test strip      . PREVNAR 13 SUSP injection inject 0.5 milliliter intramuscularly  0   . rOPINIRole (REQUIP) 0.5 MG tablet Take 0.5 mg by mouth at bedtime.   Taking  . senna-docusate (SENOKOT-S) 8.6-50 MG per tablet Take 1 tablet by mouth at bedtime as needed for mild constipation. 30 tablet 1 Taking  . warfarin (COUMADIN) 1 MG tablet Take 1 tablet (1 mg total) by mouth daily at 6 PM. 30 tablet 2 Taking  . [DISCONTINUED] amoxicillin (AMOXIL) 875 MG tablet         Allergies  Allergen Reactions  . Ciprofloxacin Itching and Rash  . Fosamax [Alendronate Sodium] Other (See Comments)    Reaction: Ulcers in mouth and esophagus  . Iron Other (See Comments)     Past Medical History:   Diagnosis Date  . Atrial fibrillation (HCC)   . Atrial flutter (HCC)   . CHF (congestive heart failure) (HCC)   . CKD (chronic kidney disease) stage 3, GFR 30-59 ml/min (HCC)   . Coronary disease   . Dehydration   . Diabetes (HCC)   . Fracture of humeral shaft, right, closed   . Gout   . Hypertension   . Hypothyroidism   . Peripheral neuropathy   . Renal insufficiency   . Right renal artery stenosis (HCC)   . Syncope and collapse   . Vitiligo     Review of systems:      Physical Exam    Heart and lungs: Irregular rhythm intermittent, with regular rhythm.    HEENT: Normocephalic atraumatic eyes are anicteric    Other:     Pertinant exam for procedure: Soft nontender nondistended bowel sounds positive normoactive.    Planned proceedures: EGD and indicated procedures.  I have discussed the risks benefits and complications of procedures to include not limited to bleeding, infection, perforation and the risk of sedation and the patient wishes to proceed.    Haley Deem, MD Gastroenterology 06/10/2017  9:50 AM

## 2017-06-14 ENCOUNTER — Ambulatory Visit (INDEPENDENT_AMBULATORY_CARE_PROVIDER_SITE_OTHER): Payer: Medicare Other | Admitting: Podiatry

## 2017-06-14 DIAGNOSIS — Z794 Long term (current) use of insulin: Secondary | ICD-10-CM

## 2017-06-14 DIAGNOSIS — E0852 Diabetes mellitus due to underlying condition with diabetic peripheral angiopathy with gangrene: Secondary | ICD-10-CM

## 2017-06-14 DIAGNOSIS — B351 Tinea unguium: Secondary | ICD-10-CM | POA: Diagnosis not present

## 2017-06-14 DIAGNOSIS — M2041 Other hammer toe(s) (acquired), right foot: Secondary | ICD-10-CM

## 2017-06-14 DIAGNOSIS — I739 Peripheral vascular disease, unspecified: Secondary | ICD-10-CM

## 2017-06-14 DIAGNOSIS — M79676 Pain in unspecified toe(s): Secondary | ICD-10-CM

## 2017-06-14 DIAGNOSIS — M2042 Other hammer toe(s) (acquired), left foot: Secondary | ICD-10-CM

## 2017-06-14 NOTE — Progress Notes (Signed)
Complaint:  Visit Type: Patient returns to my office for continued preventative foot care services. Complaint: Patient states" my nails have grown long and thick and become painful to walk and wear shoes" Patient has been diagnosed with DM with no foot complications. The patient presents for preventative foot care services. No changes to ROS  Podiatric Exam: Vascular: dorsalis pedis and posterior tibial pulses are absent  bilateral. Capillary return is diminished . Cold feet noted.  Venous stasis feet  B/L.Marland Kitchen. Skin turgor WNL  Sensorium  Semmes Weinstein monofilament test is diminished. Normal tactile sensation bilaterally. Nail Exam: Pt has thick disfigured discolored nails with subungual debris noted bilateral entire nail hallux through fifth toenails Ulcer Exam: There is no evidence of ulcer or pre-ulcerative changes or infection. Orthopedic Exam: Muscle tone and strength are WNL. No limitations in general ROM. No crepitus or effusions noted. Foot type and digits show no abnormalities. Bony prominences are unremarkable. Not interested in diabetic shoes. Skin: No Porokeratosis. No infection or ulcers  Diagnosis:  Onychomycosis, , Pain in right toe, pain in left toes Diabetes with angiopathy.  Treatment & Plan Procedures and Treatment: Consent by patient was obtained for treatment procedures. The patient understood the discussion of treatment and procedures well. All questions were answered thoroughly reviewed. Debridement of mycotic and hypertrophic toenails, 1 through 5 bilateral and clearing of subungual debris. No ulceration, no infection noted.  Return Visit-Office Procedure: Patient instructed to return to the office for a follow up visit 3 months for continued evaluation and treatment.    Helane GuntherGregory Valkyrie Guardiola DPM

## 2017-06-15 ENCOUNTER — Encounter: Payer: Self-pay | Admitting: Gastroenterology

## 2017-09-08 ENCOUNTER — Ambulatory Visit
Admission: RE | Admit: 2017-09-08 | Discharge: 2017-09-08 | Disposition: A | Discharge status: Home / Self Care | Payer: Medicare Other | Source: Ambulatory Visit | Attending: Nephrology | Admitting: Nephrology

## 2017-09-08 ENCOUNTER — Other Ambulatory Visit: Payer: Self-pay | Admitting: Nephrology

## 2017-09-08 DIAGNOSIS — M7989 Other specified soft tissue disorders: Secondary | ICD-10-CM | POA: Diagnosis not present

## 2017-09-08 DIAGNOSIS — M25562 Pain in left knee: Secondary | ICD-10-CM | POA: Diagnosis present

## 2017-09-08 DIAGNOSIS — I7090 Unspecified atherosclerosis: Secondary | ICD-10-CM | POA: Diagnosis not present

## 2017-09-08 DIAGNOSIS — R52 Pain, unspecified: Secondary | ICD-10-CM

## 2017-09-08 DIAGNOSIS — R937 Abnormal findings on diagnostic imaging of other parts of musculoskeletal system: Secondary | ICD-10-CM | POA: Insufficient documentation

## 2017-09-08 DIAGNOSIS — M11262 Other chondrocalcinosis, left knee: Secondary | ICD-10-CM | POA: Insufficient documentation

## 2017-09-16 ENCOUNTER — Encounter: Payer: Self-pay | Admitting: Podiatry

## 2017-09-16 ENCOUNTER — Ambulatory Visit (INDEPENDENT_AMBULATORY_CARE_PROVIDER_SITE_OTHER): Payer: Medicare Other | Admitting: Podiatry

## 2017-09-16 DIAGNOSIS — B351 Tinea unguium: Secondary | ICD-10-CM | POA: Diagnosis not present

## 2017-09-16 DIAGNOSIS — E0852 Diabetes mellitus due to underlying condition with diabetic peripheral angiopathy with gangrene: Secondary | ICD-10-CM

## 2017-09-16 DIAGNOSIS — M79676 Pain in unspecified toe(s): Secondary | ICD-10-CM

## 2017-09-16 DIAGNOSIS — I739 Peripheral vascular disease, unspecified: Secondary | ICD-10-CM

## 2017-09-16 DIAGNOSIS — M2042 Other hammer toe(s) (acquired), left foot: Secondary | ICD-10-CM

## 2017-09-16 DIAGNOSIS — Z794 Long term (current) use of insulin: Secondary | ICD-10-CM

## 2017-09-16 DIAGNOSIS — M2041 Other hammer toe(s) (acquired), right foot: Secondary | ICD-10-CM

## 2017-09-16 NOTE — Progress Notes (Signed)
Complaint:  Visit Type: Patient returns to my office for continued preventative foot care services. Complaint: Patient states" my nails have grown long and thick and become painful to walk and wear shoes" Patient has been diagnosed with DM with no foot complications. The patient presents for preventative foot care services. No changes to ROS  Podiatric Exam: Vascular: dorsalis pedis and posterior tibial pulses are absent  bilateral. Capillary return is diminished . Cold feet noted.  Venous stasis feet  B/L.Marland Kitchen. Skin turgor WNL  Sensorium  Semmes Weinstein monofilament test is diminished. Normal tactile sensation bilaterally. Nail Exam: Pt has thick disfigured discolored nails with subungual debris noted bilateral entire nail hallux through fifth toenails Ulcer Exam: There is no evidence of ulcer or pre-ulcerative changes or infection. Orthopedic Exam: Muscle tone and strength are WNL. No limitations in general ROM. No crepitus or effusions noted. Foot type and digits show no abnormalities. HAV  B/L.  Hammer toes  B/L. Skin: No Porokeratosis. No infection or ulcers  Diagnosis:  Onychomycosis, , Pain in right toe, pain in left toes Diabetes with angiopathy.  Treatment & Plan Procedures and Treatment: Consent by patient was obtained for treatment procedures. The patient understood the discussion of treatment and procedures well. All questions were answered thoroughly reviewed. Debridement of mycotic and hypertrophic toenails, 1 through 5 bilateral and clearing of subungual debris. No ulceration, no infection noted. Patient is not interested in diabetic shoes.  Return Visit-Office Procedure: Patient instructed to return to the office for a follow up visit 3 months for continued evaluation and treatment.    Helane GuntherGregory Pansey Pinheiro DPM

## 2017-12-16 ENCOUNTER — Encounter: Payer: Self-pay | Admitting: Podiatry

## 2017-12-16 ENCOUNTER — Ambulatory Visit: Payer: Medicare Other | Admitting: Podiatry

## 2017-12-16 DIAGNOSIS — B351 Tinea unguium: Secondary | ICD-10-CM | POA: Diagnosis not present

## 2017-12-16 DIAGNOSIS — Z794 Long term (current) use of insulin: Secondary | ICD-10-CM

## 2017-12-16 DIAGNOSIS — L97521 Non-pressure chronic ulcer of other part of left foot limited to breakdown of skin: Secondary | ICD-10-CM | POA: Diagnosis not present

## 2017-12-16 DIAGNOSIS — M79676 Pain in unspecified toe(s): Secondary | ICD-10-CM | POA: Diagnosis not present

## 2017-12-16 DIAGNOSIS — E0852 Diabetes mellitus due to underlying condition with diabetic peripheral angiopathy with gangrene: Secondary | ICD-10-CM

## 2017-12-16 DIAGNOSIS — I739 Peripheral vascular disease, unspecified: Secondary | ICD-10-CM

## 2017-12-16 NOTE — Progress Notes (Signed)
Complaint:  Visit Type: Patient returns to my office for continued preventative foot care services. Complaint: Patient states" my nails have grown long and thick and become painful to walk and wear shoes" Patient has been diagnosed with DM with no foot complications. The patient presents for preventative foot care services. No changes to ROS  Podiatric Exam: Vascular: dorsalis pedis and posterior tibial pulses are absent  bilateral. Capillary return is diminished . Cold feet noted.  Venous stasis feet  B/L.Marland Kitchen. Skin turgor WNL  Sensorium  Semmes Weinstein monofilament test is diminished. Normal tactile sensation bilaterally. Nail Exam: Pt has thick disfigured discolored nails with subungual debris noted bilateral entire nail hallux through fifth toenails Ulcer Exam: There is no evidence of ulcer or pre-ulcerative changes or infection. Orthopedic Exam: Muscle tone and strength are WNL. No limitations in general ROM. No crepitus or effusions noted. Foot type and digits show no abnormalities. HAV  B/L.  Hammer toes  B/L. Skin: No Porokeratosis. No infection or ulcers.  Distal clavi second toe left foot.  Diagnosis:  Onychomycosis, , Pain in right toe, pain in left toes Diabetes with angiopathy. Clavi/ulcer second toe left foot.  Treatment & Plan Procedures and Treatment: Consent by patient was obtained for treatment procedures. The patient understood the discussion of treatment and procedures well. All questions were answered thoroughly reviewed. Debridement of mycotic and hypertrophic toenails, 1 through 5 bilateral and clearing of subungual debris. No ulceration, no infection noted. Debridement of clavi. Debridement of clavi on the second digit, left foot reveals drainage.  The clavicle I/ulcer was bandaged with Neosporin and a dry sterile dressing.  Patient was told to peroxide this site through the weekend  Return Visit-Office Procedure: Patient instructed to return to the office for a follow up visit  3 months for continued evaluation and treatment.    Helane GuntherGregory Demaurion Dicioccio DPM

## 2018-02-20 ENCOUNTER — Emergency Department: Payer: Medicare Other

## 2018-02-20 ENCOUNTER — Observation Stay
Admission: EM | Admit: 2018-02-20 | Discharge: 2018-02-23 | Disposition: A | Discharge status: Skilled Nursing Facility | Payer: Medicare Other | Attending: Internal Medicine | Admitting: Internal Medicine

## 2018-02-20 ENCOUNTER — Encounter: Payer: Self-pay | Admitting: Emergency Medicine

## 2018-02-20 ENCOUNTER — Other Ambulatory Visit: Payer: Self-pay

## 2018-02-20 DIAGNOSIS — I509 Heart failure, unspecified: Secondary | ICD-10-CM | POA: Diagnosis not present

## 2018-02-20 DIAGNOSIS — N183 Chronic kidney disease, stage 3 (moderate): Secondary | ICD-10-CM | POA: Diagnosis not present

## 2018-02-20 DIAGNOSIS — Z881 Allergy status to other antibiotic agents status: Secondary | ICD-10-CM | POA: Diagnosis not present

## 2018-02-20 DIAGNOSIS — Z7901 Long term (current) use of anticoagulants: Secondary | ICD-10-CM | POA: Insufficient documentation

## 2018-02-20 DIAGNOSIS — E785 Hyperlipidemia, unspecified: Secondary | ICD-10-CM | POA: Insufficient documentation

## 2018-02-20 DIAGNOSIS — G319 Degenerative disease of nervous system, unspecified: Secondary | ICD-10-CM | POA: Insufficient documentation

## 2018-02-20 DIAGNOSIS — E1140 Type 2 diabetes mellitus with diabetic neuropathy, unspecified: Secondary | ICD-10-CM | POA: Insufficient documentation

## 2018-02-20 DIAGNOSIS — Z8261 Family history of arthritis: Secondary | ICD-10-CM | POA: Insufficient documentation

## 2018-02-20 DIAGNOSIS — Z888 Allergy status to other drugs, medicaments and biological substances status: Secondary | ICD-10-CM | POA: Insufficient documentation

## 2018-02-20 DIAGNOSIS — Z79899 Other long term (current) drug therapy: Secondary | ICD-10-CM | POA: Diagnosis not present

## 2018-02-20 DIAGNOSIS — Z951 Presence of aortocoronary bypass graft: Secondary | ICD-10-CM | POA: Insufficient documentation

## 2018-02-20 DIAGNOSIS — Z79890 Hormone replacement therapy (postmenopausal): Secondary | ICD-10-CM | POA: Diagnosis not present

## 2018-02-20 DIAGNOSIS — I482 Chronic atrial fibrillation: Secondary | ICD-10-CM | POA: Insufficient documentation

## 2018-02-20 DIAGNOSIS — N179 Acute kidney failure, unspecified: Secondary | ICD-10-CM | POA: Insufficient documentation

## 2018-02-20 DIAGNOSIS — I2510 Atherosclerotic heart disease of native coronary artery without angina pectoris: Secondary | ICD-10-CM | POA: Insufficient documentation

## 2018-02-20 DIAGNOSIS — Z9889 Other specified postprocedural states: Secondary | ICD-10-CM | POA: Insufficient documentation

## 2018-02-20 DIAGNOSIS — E860 Dehydration: Secondary | ICD-10-CM | POA: Diagnosis not present

## 2018-02-20 DIAGNOSIS — I130 Hypertensive heart and chronic kidney disease with heart failure and stage 1 through stage 4 chronic kidney disease, or unspecified chronic kidney disease: Secondary | ICD-10-CM | POA: Diagnosis not present

## 2018-02-20 DIAGNOSIS — R531 Weakness: Secondary | ICD-10-CM

## 2018-02-20 DIAGNOSIS — E039 Hypothyroidism, unspecified: Secondary | ICD-10-CM | POA: Insufficient documentation

## 2018-02-20 DIAGNOSIS — N390 Urinary tract infection, site not specified: Secondary | ICD-10-CM | POA: Diagnosis present

## 2018-02-20 DIAGNOSIS — B9620 Unspecified Escherichia coli [E. coli] as the cause of diseases classified elsewhere: Secondary | ICD-10-CM | POA: Diagnosis not present

## 2018-02-20 DIAGNOSIS — Z823 Family history of stroke: Secondary | ICD-10-CM | POA: Insufficient documentation

## 2018-02-20 DIAGNOSIS — E1122 Type 2 diabetes mellitus with diabetic chronic kidney disease: Secondary | ICD-10-CM | POA: Insufficient documentation

## 2018-02-20 DIAGNOSIS — M109 Gout, unspecified: Secondary | ICD-10-CM | POA: Diagnosis not present

## 2018-02-20 DIAGNOSIS — I6782 Cerebral ischemia: Secondary | ICD-10-CM | POA: Diagnosis not present

## 2018-02-20 DIAGNOSIS — R509 Fever, unspecified: Secondary | ICD-10-CM

## 2018-02-20 DIAGNOSIS — I4892 Unspecified atrial flutter: Secondary | ICD-10-CM | POA: Diagnosis not present

## 2018-02-20 DIAGNOSIS — Z794 Long term (current) use of insulin: Secondary | ICD-10-CM | POA: Insufficient documentation

## 2018-02-20 LAB — URINALYSIS, ROUTINE W REFLEX MICROSCOPIC
BILIRUBIN URINE: NEGATIVE
Glucose, UA: >=500 mg/dL — AB
Ketones, ur: NEGATIVE mg/dL
NITRITE: NEGATIVE
PH: 5 (ref 5.0–8.0)
Protein, ur: NEGATIVE mg/dL
SPECIFIC GRAVITY, URINE: 1.01 (ref 1.005–1.030)

## 2018-02-20 LAB — LACTIC ACID, PLASMA: Lactic Acid, Venous: 1.6 mmol/L (ref 0.5–1.9)

## 2018-02-20 LAB — PROTIME-INR
INR: 2.09
Prothrombin Time: 23.3 seconds — ABNORMAL HIGH (ref 11.4–15.2)

## 2018-02-20 LAB — COMPREHENSIVE METABOLIC PANEL
ALK PHOS: 100 U/L (ref 38–126)
ALT: 26 U/L (ref 0–44)
AST: 48 U/L — AB (ref 15–41)
Albumin: 3.8 g/dL (ref 3.5–5.0)
Anion gap: 9 (ref 5–15)
BUN: 39 mg/dL — AB (ref 8–23)
CHLORIDE: 98 mmol/L (ref 98–111)
CO2: 31 mmol/L (ref 22–32)
CREATININE: 2.35 mg/dL — AB (ref 0.44–1.00)
Calcium: 10 mg/dL (ref 8.9–10.3)
GFR calc non Af Amer: 19 mL/min — ABNORMAL LOW (ref >60)
GFR, EST AFRICAN AMERICAN: 22 mL/min — AB (ref >60)
GLUCOSE: 322 mg/dL — AB (ref 70–99)
Potassium: 3.8 mmol/L (ref 3.5–5.1)
Sodium: 138 mmol/L (ref 135–145)
Total Bilirubin: 1.1 mg/dL (ref 0.3–1.2)
Total Protein: 7 g/dL (ref 6.5–8.1)

## 2018-02-20 LAB — CBC WITH DIFFERENTIAL/PLATELET
BASOS PCT: 1 %
Basophils Absolute: 0.1 10*3/uL (ref 0–0.1)
EOS ABS: 0.1 10*3/uL (ref 0–0.7)
EOS PCT: 3 %
HCT: 39.4 % (ref 35.0–47.0)
HEMOGLOBIN: 13.1 g/dL (ref 12.0–16.0)
LYMPHS ABS: 1.2 10*3/uL (ref 1.0–3.6)
Lymphocytes Relative: 22 %
MCH: 35.6 pg — AB (ref 26.0–34.0)
MCHC: 33.3 g/dL (ref 32.0–36.0)
MCV: 106.9 fL — ABNORMAL HIGH (ref 80.0–100.0)
MONO ABS: 0.3 10*3/uL (ref 0.2–0.9)
MONOS PCT: 6 %
NEUTROS PCT: 68 %
Neutro Abs: 3.6 10*3/uL (ref 1.4–6.5)
PLATELETS: 77 10*3/uL — AB (ref 150–440)
RBC: 3.68 MIL/uL — ABNORMAL LOW (ref 3.80–5.20)
RDW: 15 % — AB (ref 11.5–14.5)
WBC: 5.2 10*3/uL (ref 3.6–11.0)

## 2018-02-20 LAB — GLUCOSE, CAPILLARY: Glucose-Capillary: 200 mg/dL — ABNORMAL HIGH (ref 70–99)

## 2018-02-20 LAB — TROPONIN I: TROPONIN I: 0.03 ng/mL — AB (ref <0.03)

## 2018-02-20 MED ORDER — HYDROCODONE-ACETAMINOPHEN 5-325 MG PO TABS
1.0000 | ORAL_TABLET | Freq: Four times a day (QID) | ORAL | Status: DC | PRN
  Administered 2018-02-22: 1 via ORAL
  Filled 2018-02-20: qty 1

## 2018-02-20 MED ORDER — VITAMIN D3 25 MCG (1000 UNIT) PO TABS
2000.0000 [IU] | ORAL_TABLET | Freq: Every day | ORAL | Status: DC
  Administered 2018-02-21 – 2018-02-23 (×3): 2000 [IU] via ORAL
  Filled 2018-02-20 (×6): qty 2

## 2018-02-20 MED ORDER — DOCUSATE SODIUM 100 MG PO CAPS
100.0000 mg | ORAL_CAPSULE | Freq: Two times a day (BID) | ORAL | Status: DC
  Administered 2018-02-20 – 2018-02-22 (×4): 100 mg via ORAL
  Filled 2018-02-20 (×4): qty 1

## 2018-02-20 MED ORDER — LEVOTHYROXINE SODIUM 100 MCG PO TABS
100.0000 ug | ORAL_TABLET | Freq: Every day | ORAL | Status: DC
  Administered 2018-02-21: 06:00:00 100 ug via ORAL
  Filled 2018-02-20: qty 1

## 2018-02-20 MED ORDER — GLIPIZIDE 10 MG PO TABS
10.0000 mg | ORAL_TABLET | Freq: Two times a day (BID) | ORAL | Status: DC
  Administered 2018-02-21 – 2018-02-23 (×5): 10 mg via ORAL
  Filled 2018-02-20 (×6): qty 1

## 2018-02-20 MED ORDER — ROPINIROLE HCL 1 MG PO TABS
0.5000 mg | ORAL_TABLET | Freq: Every day | ORAL | Status: DC
  Administered 2018-02-20 – 2018-02-22 (×3): 0.5 mg via ORAL
  Filled 2018-02-20 (×3): qty 1

## 2018-02-20 MED ORDER — SODIUM CHLORIDE 0.9 % IV BOLUS
1000.0000 mL | Freq: Once | INTRAVENOUS | Status: AC
  Administered 2018-02-20: 1000 mL via INTRAVENOUS

## 2018-02-20 MED ORDER — MUPIROCIN 2 % EX OINT
TOPICAL_OINTMENT | Freq: Two times a day (BID) | CUTANEOUS | Status: DC
  Filled 2018-02-20: qty 22

## 2018-02-20 MED ORDER — METOPROLOL SUCCINATE ER 25 MG PO TB24
25.0000 mg | ORAL_TABLET | Freq: Every day | ORAL | Status: DC
  Administered 2018-02-23: 25 mg via ORAL
  Filled 2018-02-20 (×3): qty 1

## 2018-02-20 MED ORDER — SENNOSIDES-DOCUSATE SODIUM 8.6-50 MG PO TABS: 1.0000 | ORAL_TABLET | Freq: Every evening | ORAL | Status: DC | PRN

## 2018-02-20 MED ORDER — SODIUM CHLORIDE 0.9 % IV SOLN
1.0000 g | INTRAVENOUS | Status: DC
  Administered 2018-02-21 – 2018-02-22 (×2): 1 g via INTRAVENOUS
  Filled 2018-02-20 (×2): qty 1

## 2018-02-20 MED ORDER — SIMVASTATIN 20 MG PO TABS
40.0000 mg | ORAL_TABLET | Freq: Every day | ORAL | Status: DC
  Administered 2018-02-20 – 2018-02-22 (×3): 40 mg via ORAL
  Filled 2018-02-20 (×3): qty 2

## 2018-02-20 MED ORDER — SODIUM CHLORIDE 0.9 % IV SOLN
2.0000 g | Freq: Once | INTRAVENOUS | Status: AC
  Administered 2018-02-20: 2 g via INTRAVENOUS
  Filled 2018-02-20: qty 20

## 2018-02-20 MED ORDER — PANTOPRAZOLE SODIUM 40 MG PO TBEC
40.0000 mg | DELAYED_RELEASE_TABLET | Freq: Every day | ORAL | Status: DC
  Administered 2018-02-21 – 2018-02-23 (×3): 40 mg via ORAL
  Filled 2018-02-20 (×3): qty 1

## 2018-02-20 MED ORDER — ALLOPURINOL 300 MG PO TABS
300.0000 mg | ORAL_TABLET | Freq: Every day | ORAL | Status: DC
  Administered 2018-02-21 – 2018-02-23 (×3): 300 mg via ORAL
  Filled 2018-02-20 (×3): qty 1

## 2018-02-20 MED ORDER — ONDANSETRON HCL 4 MG PO TABS
4.0000 mg | ORAL_TABLET | Freq: Four times a day (QID) | ORAL | Status: DC | PRN
Start: 2018-02-20 — End: 2018-02-23

## 2018-02-20 MED ORDER — ISOSORBIDE MONONITRATE 20 MG PO TABS
20.0000 mg | ORAL_TABLET | Freq: Two times a day (BID) | ORAL | Status: DC
  Administered 2018-02-21: 10:00:00 20 mg via ORAL
  Filled 2018-02-20 (×2): qty 1

## 2018-02-20 MED ORDER — WARFARIN - PHYSICIAN DOSING INPATIENT: Freq: Every day | Status: DC

## 2018-02-20 MED ORDER — ONDANSETRON HCL 4 MG/2ML IJ SOLN: 4.0000 mg | Freq: Four times a day (QID) | INTRAMUSCULAR | Status: DC | PRN

## 2018-02-20 MED ORDER — FERROUS SULFATE 325 (65 FE) MG PO TABS
325.0000 mg | ORAL_TABLET | Freq: Every day | ORAL | Status: DC
  Administered 2018-02-21 – 2018-02-23 (×3): 325 mg via ORAL
  Filled 2018-02-20 (×3): qty 1

## 2018-02-20 MED ORDER — ACETAMINOPHEN 650 MG RE SUPP: 650.0000 mg | Freq: Four times a day (QID) | RECTAL | Status: DC | PRN

## 2018-02-20 MED ORDER — SODIUM CHLORIDE 0.9 % IV SOLN
INTRAVENOUS | Status: DC
  Administered 2018-02-20: 22:00:00 via INTRAVENOUS

## 2018-02-20 MED ORDER — ACETAMINOPHEN 325 MG PO TABS
650.0000 mg | ORAL_TABLET | Freq: Four times a day (QID) | ORAL | Status: DC | PRN
  Administered 2018-02-22: 01:00:00 650 mg via ORAL
  Filled 2018-02-20: qty 2

## 2018-02-20 MED ORDER — VITAMIN B-12 1000 MCG PO TABS
500.0000 ug | ORAL_TABLET | Freq: Every day | ORAL | Status: DC
  Administered 2018-02-21 – 2018-02-23 (×3): 500 ug via ORAL
  Filled 2018-02-20 (×3): qty 1

## 2018-02-20 MED ORDER — WARFARIN SODIUM 1 MG PO TABS
1.0000 mg | ORAL_TABLET | Freq: Every day | ORAL | Status: DC
  Filled 2018-02-20: qty 1

## 2018-02-20 MED ORDER — GABAPENTIN 400 MG PO CAPS
800.0000 mg | ORAL_CAPSULE | Freq: Three times a day (TID) | ORAL | Status: DC
  Administered 2018-02-20 – 2018-02-21 (×3): 800 mg via ORAL
  Filled 2018-02-20: qty 2
  Filled 2018-02-20 (×2): qty 8
  Filled 2018-02-20: qty 2
  Filled 2018-02-20: qty 8
  Filled 2018-02-20 (×2): qty 2

## 2018-02-20 MED ORDER — CLONIDINE HCL 0.1 MG PO TABS
0.1000 mg | ORAL_TABLET | Freq: Three times a day (TID) | ORAL | Status: DC
  Administered 2018-02-20 – 2018-02-23 (×8): 0.1 mg via ORAL
  Filled 2018-02-20 (×8): qty 1

## 2018-02-20 NOTE — ED Provider Notes (Signed)
[facility] Emergency Department Provider Note  Time seen: 6:10 PM  I have reviewed the triage vital signs and the nursing notes.   HISTORY  Chief Complaint Weakness    HPI Haley Hill is a 77 y.o. female with a past medical history of atrial fibrillation, CHF, CKD, diabetes, hypertension, presents to the emergency department for weakness.  According to the patient today she has felt very weak and sweaty at times.  Describes a generalized fatigue with weakness mostly in both of her legs.  Denies any unilateral symptoms.  Denies any headache or numbness.  Denies any chest pain, cough, abdominal pain, nausea vomiting, diarrhea or dysuria.  Largely negative review of systems.  States she was able to walk today with her walker but over the past several hours has felt too weak to even do that.   Past Medical History:  Diagnosis Date  . Atrial fibrillation (HCC)   . Atrial flutter (HCC)   . CHF (congestive heart failure) (HCC)   . CKD (chronic kidney disease) stage 3, GFR 30-59 ml/min (HCC)   . Coronary disease   . Dehydration   . Diabetes (HCC)   . Fracture of humeral shaft, right, closed   . Gout   . Hypertension   . Hypothyroidism   . Peripheral neuropathy   . Renal insufficiency   . Right renal artery stenosis (HCC)   . Syncope and collapse   . Vitiligo     Patient Active Problem List   Diagnosis Date Noted  . Hypoxia 04/29/2015  . Chronic kidney disease (CKD), stage III (moderate) (HCC) 03/07/2015  . ARF (acute renal failure) (HCC) 01/25/2015  . Fracture of humeral shaft, right, closed 01/25/2015  . Acute renal failure (HCC) 01/25/2015  . Closed fracture of humerus, shaft 01/25/2015  . Congestive heart failure (HCC) 09/25/2014  . AI (aortic incompetence) 02/07/2014  . Edema leg 02/07/2014  . History of cardiac catheterization 02/07/2014  . Renal artery stenosis (HCC) 02/07/2014  . H/O coronary artery bypass surgery 02/07/2014  . Atrial  fibrillation (HCC) 01/04/2014  . Arteriosclerosis of coronary artery 01/04/2014  . Type 2 diabetes mellitus (HCC) 01/04/2014  . BP (high blood pressure) 01/04/2014  . HLD (hyperlipidemia) 01/04/2014  . Adult hypothyroidism 01/04/2014  . Neuropathy 01/04/2014    Past Surgical History:  Procedure Laterality Date  . BREAST BIOPSY Right 10/2000   negative for cancer  . BREAST EXCISIONAL BIOPSY Bilateral 1970's   neg  . CARDIAC CATHETERIZATION    . CORONARY ARTERY BYPASS GRAFT    . ESOPHAGOGASTRODUODENOSCOPY (EGD) WITH PROPOFOL N/A 06/10/2017   Procedure: ESOPHAGOGASTRODUODENOSCOPY (EGD) WITH PROPOFOL;  Surgeon: Christena Deem, MD;  Location: [facility] ENDOSCOPY;  Service: Endoscopy;  Laterality: N/A;  . FRACTURE SURGERY      Prior to Admission medications   Medication Sig Start Date End Date Taking? Authorizing Provider  acetaminophen (TYLENOL) 500 MG tablet Take by mouth.    [provider]  allopurinol (ZYLOPRIM) 100 MG tablet  08/11/15   [provider]  allopurinol (ZYLOPRIM) 300 MG tablet  09/07/16   [provider]  Cholecalciferol (VITAMIN D) 2000 UNITS CAPS Take 2,000 Units by mouth daily.    [provider]  cloNIDine (CATAPRES) 0.1 MG tablet Take 0.1 mg by mouth every 8 (eight) hours.     [provider]  cyanocobalamin 500 MCG tablet Take 500 mcg by mouth daily.    [provider]  docusate sodium (COLACE) 100 MG capsule Take 1  capsule (100 mg total) by mouth 2 (two) times daily. 01/28/15   Barbette ReichmannHande, Vishwanath, MD  doxycycline (ADOXA) 100 MG tablet Take 100 mg by mouth 2 (two) times daily.    [provider]  ferrous sulfate 325 (65 FE) MG tablet Take 325 mg by mouth daily with breakfast.    [provider]  furosemide (LASIX) 40 MG tablet Take 1 tablet (40 mg total) by mouth 2 (two) times daily. 01/28/15   Barbette ReichmannHande, Vishwanath, MD  gabapentin (NEURONTIN) 800 MG tablet Take 800 mg by mouth every 8 (eight) hours.      [provider]  glipiZIDE (GLUCOTROL) 10 MG tablet Take 10 mg by mouth 2 (two) times daily before a meal.    [provider]  HYDROcodone-acetaminophen (NORCO/VICODIN) 5-325 MG per tablet Take 1 tablet by mouth every 6 (six) hours as needed for moderate pain. 05/02/15   Barbette ReichmannHande, Vishwanath, MD  isosorbide mononitrate (ISMO,MONOKET) 20 MG tablet  09/07/16   [provider]  levothyroxine (SYNTHROID, LEVOTHROID) 100 MCG tablet Take 100 mcg by mouth daily before breakfast.    [provider]  metoprolol succinate (TOPROL-XL) 25 MG 24 hr tablet  09/07/16   [provider]  mupirocin ointment (BACTROBAN) 2 % Apply to wound twice a day. 04/22/16   Hyatt, Max T, DPM  nitrofurantoin, macrocrystal-monohydrate, (MACROBID) 100 MG capsule take 1 capsule by mouth twice a day for 7 days 07/03/16   [provider]  omeprazole (PRILOSEC) 20 MG capsule Take 20 mg by mouth 2 (two) times daily.    [provider]  ONE TOUCH ULTRA TEST test strip  08/12/16   [provider]  potassium chloride SA (K-DUR,KLOR-CON) 20 MEQ tablet Take 20 mEq by mouth daily.    [provider]  PREVNAR 13 SUSP injection inject 0.5 milliliter intramuscularly 07/10/16   [provider]  rOPINIRole (REQUIP) 0.5 MG tablet Take 0.5 mg by mouth at bedtime.    [provider]  senna-docusate (SENOKOT-S) 8.6-50 MG per tablet Take 1 tablet by mouth at bedtime as needed for mild constipation. 05/02/15   Barbette ReichmannHande, Vishwanath, MD  simvastatin (ZOCOR) 40 MG tablet Take 40 mg by mouth at bedtime.     [provider]  warfarin (COUMADIN) 1 MG tablet Take 1 tablet (1 mg total) by mouth daily at 6 PM. 05/02/15   Barbette ReichmannHande, Vishwanath, MD  ZOSTAVAX [id number] UNT/0.65ML injection inject contents of 1 vial subcutaneously 07/10/16   [provider]    Allergies  Allergen Reactions  . Ciprofloxacin Itching and Rash  . Fosamax [Alendronate Sodium] Other (See  Comments)    Reaction: Ulcers in mouth and esophagus  . Iron Other (See Comments)    Family History  Problem Relation Age of Onset  . Gout Other   . Stroke Other   . Arthritis Other   . Osteoporosis Other     Social History Social History   Tobacco Use  . Smoking status: Never Smoker  . Smokeless tobacco: Never Used  Substance Use Topics  . Alcohol use: No  . Drug use: No    Review of Systems Constitutional: Negative for fever.  Positive generalized fatigue/weakness Eyes: Negative for visual complaints ENT: Negative for recent illness/congestion Cardiovascular: Negative for chest pain. Respiratory: Negative for shortness of breath. Gastrointestinal: Negative for abdominal pain, vomiting and diarrhea. Genitourinary: Negative for urinary compaints Musculoskeletal: Negative for musculoskeletal complaints Skin: Positive for diaphoresis. Neurological: Negative for headache All other ROS negative  ____________________________________________  PHYSICAL EXAM:  VITAL SIGNS: ED Triage Vitals  Enc Vitals Group     BP 02/20/18 1807 114/68     Pulse Rate 02/20/18 1807 72     Resp 02/20/18 1807 (!) 22     Temp 02/20/18 1807 98.6 F (37 C)     Temp Source 02/20/18 1807 Oral     SpO2 02/20/18 1807 93 %     Weight 02/20/18 1808 213 lb (96.6 kg)     Height 02/20/18 1808 5\' 7"  (1.702 m)     Head Circumference --      Peak Flow --      Pain Score 02/20/18 1808 0     Pain Loc --      Pain Edu? --      Excl. in GC? --    Constitutional: Alert and oriented. Well appearing and in no distress. Eyes: Normal exam ENT   Head: Normocephalic and atraumatic.   Mouth/Throat: Mucous membranes are moist. Cardiovascular: Normal rate, regular rhythm Respiratory: Normal respiratory effort without tachypnea nor retractions. Breath sounds are clear Gastrointestinal: Soft and nontender. No distention.   Musculoskeletal: Nontender with normal range of motion in all extremities.   Slight edema, no tenderness. Neurologic:  Normal speech and language. No gross focal neurologic deficits  Skin: Skin is warm, mild diaphoresis. Psychiatric: Mood and affect are normal.   ____________________________________________    EKG  EKG reviewed and interpreted by myself shows atrial fibrillation at 69 bpm with a narrow QRS, normal axis, normal intervals, nonspecific but no concerning ST changes.  ____________________________________________    RADIOLOGY  CT scan negative  ____________________________________________   INITIAL IMPRESSION / ASSESSMENT AND PLAN / ED COURSE  Pertinent labs & imaging results that were available during my care of the patient were reviewed by me and considered in my medical decision making (see chart for details).  Patient seen in the emergency department today for generalized fatigue/weakness starting this morning and progressing throughout the day today.  Per EMS patient had a temperature 100.1, afebrile in the emergency department.  Patient is mildly diaphoretic in the emergency department.  Patient is complaining of generalized weakness mostly in both of her legs but denies any unilateral symptoms.  Great grip strengths, with equal strength bilaterally.  Differential this time would include infectious etiology, pneumonia, urinary tract infection, metabolic or electrolyte abnormality, dehydration, ACS.  We will check labs including urinalysis, cardiac enzymes, blood and urine cultures as a precaution.  We will IV hydrate and continue to closely monitor while awaiting results.  Labs have resulted showing a urinary tract infection.  We will begin IV antibiotics.  Overall the patient appears well at this time but does appear to be weak.  Given the patient's generalized weakness and inability to ambulate at home we will admit to the hospitalist service for urinary tract infection generalized weakness.  Patient also has mild renal insufficiency compared  to baseline as well as hyperglycemia we will continue with IV hydration to treat these.  ____________________________________________   FINAL CLINICAL IMPRESSION(S) / ED DIAGNOSES  Weakness Urinary tract infection   Minna Antis, MD 02/20/18 Ernestina [place]

## 2018-02-20 NOTE — Progress Notes (Signed)
CODE SEPSIS - PHARMACY COMMUNICATION  **Broad Spectrum Antibiotics should be administered within 1 hour of Sepsis diagnosis**  Time Code Sepsis Called/Page Received: 1842  Antibiotics Ordered: ceftriaxone 2g IV   Time of 1st antibiotic administration: 1914  Additional action taken by pharmacy: Mardene Sayerontacted Rebecca, RN at [phone] to make sure ceftriaxone was received.   Gardner CandleSheema M Tari Lecount, PharmD, BCPS Clinical Pharmacist 02/20/2018 7:17 PM

## 2018-02-20 NOTE — Progress Notes (Signed)
Advanced care plan. Purpose of the Encounter: CODE STATUS Parties in Attendance: Patient and family Patient's Decision Capacity: Good Subjective/Patient's story: Presented to the emergency room for generalized weakness Objective/Medical story Has urinary tract infection and weakness Goals of care determination:  Advance care directives and goals of care discussed Patient and family for now want CPR and intubation if the need arises CODE STATUS: Full code Time spent discussing advanced care planning: 16 minutes

## 2018-02-20 NOTE — ED Triage Notes (Signed)
Pt arrives via [facility] from home with complaints of increasing weakness today. Pt reports weakness in her legs. Uses walker at home but has not been able to walk well today, per family. Pt very warm to touch, flushed in the face and diaphoretic. MD aware.

## 2018-02-20 NOTE — H&P (Signed)
[facility] - [place] at [facility]   PATIENT NAME: Haley Hill    MR#:  [id number]  DATE OF BIRTH:  1941-03-19  DATE OF ADMISSION:  02/20/2018  PRIMARY CARE PHYSICIAN: Barbette Reichmann, MD   REQUESTING/REFERRING PHYSICIAN:   CHIEF COMPLAINT:   Chief Complaint  Patient presents with  . Weakness    HISTORY OF PRESENT ILLNESS: Haley Hill  is a 77 y.o. female with a known history of atrial fibrillation, congestive heart failure, chronic kidney disease stage III, coronary artery disease, hypertension, peripheral neuropathy, renal artery stenosis presented to the emergency room with generalized weakness.  According to the patient family she was sitting in the outside porch in the hot sun this afternoon.  Patient felt weak and tired.  She uses a walker at home to ambulate.  No history of fall or head injury.  Urinalysis in the emergency room showed infection.  Currently patient has home health services at home. Hospitalist service was consulted for further care.  PAST MEDICAL HISTORY:   Past Medical History:  Diagnosis Date  . Atrial fibrillation (HCC)   . Atrial flutter (HCC)   . CHF (congestive heart failure) (HCC)   . CKD (chronic kidney disease) stage 3, GFR 30-59 ml/min (HCC)   . Coronary disease   . Dehydration   . Diabetes (HCC)   . Fracture of humeral shaft, right, closed   . Gout   . Hypertension   . Hypothyroidism   . Peripheral neuropathy   . Renal insufficiency   . Right renal artery stenosis (HCC)   . Syncope and collapse   . Vitiligo     PAST SURGICAL HISTORY:  Past Surgical History:  Procedure Laterality Date  . BREAST BIOPSY Right 10/2000   negative for cancer  . BREAST EXCISIONAL BIOPSY Bilateral 1970's   neg  . CARDIAC CATHETERIZATION    . CORONARY ARTERY BYPASS GRAFT    . ESOPHAGOGASTRODUODENOSCOPY (EGD) WITH PROPOFOL N/A 06/10/2017   Procedure: ESOPHAGOGASTRODUODENOSCOPY (EGD) WITH PROPOFOL;  Surgeon: Christena Deem, MD;   Location: [facility];  Service: Endoscopy;  Laterality: N/A;  . FRACTURE SURGERY      SOCIAL HISTORY:  Social History   Tobacco Use  . Smoking status: Never Smoker  . Smokeless tobacco: Never Used  Substance Use Topics  . Alcohol use: No    FAMILY HISTORY:  Family History  Problem Relation Age of Onset  . Gout Other   . Stroke Other   . Arthritis Other   . Osteoporosis Other     DRUG ALLERGIES:  Allergies  Allergen Reactions  . Ciprofloxacin Itching and Rash  . Fosamax [Alendronate Sodium] Other (See Comments)    Reaction: Ulcers in mouth and esophagus  . Iron Other (See Comments)    REVIEW OF SYSTEMS:   CONSTITUTIONAL: No fever, has fatigue and weakness.  EYES: No blurred or double vision.  EARS, NOSE, AND THROAT: No tinnitus or ear pain.  RESPIRATORY: No cough, shortness of breath, wheezing or hemoptysis.  CARDIOVASCULAR: No chest pain, orthopnea, edema.  GASTROINTESTINAL: No nausea, vomiting, diarrhea or abdominal pain.  GENITOURINARY: Has dysuria,no hematuria.  ENDOCRINE: No polyuria, nocturia,  HEMATOLOGY: No anemia, easy bruising or bleeding SKIN: No rash or lesion. MUSCULOSKELETAL: No joint pain or arthritis.   NEUROLOGIC: No tingling, numbness, weakness.  PSYCHIATRY: No anxiety or depression.   MEDICATIONS AT HOME:  Prior to Admission medications   Medication Sig Start Date End Date Taking? Authorizing Provider  allopurinol (ZYLOPRIM) 300 MG tablet  Take 300 mg by mouth daily.  09/07/16  Yes [provider]  calcitRIOL (ROCALTROL) 0.25 MCG capsule Take 1 capsule by mouth 3 (three) times a week. Monday Wednesday Friday 12/07/17  Yes [provider]  calcitRIOL (ROCALTROL) 0.5 MCG capsule Take 1 capsule by mouth daily. 12/07/17  Yes [provider]  Cholecalciferol (VITAMIN D) 2000 UNITS CAPS Take 2,000 Units by mouth daily.   Yes [provider]  cyanocobalamin 500 MCG tablet Take 500 mcg by mouth daily.   Yes [provider]  ferrous sulfate 325 (65 FE) MG tablet Take 325 mg by mouth daily with breakfast.   Yes [provider]  furosemide (LASIX) 40 MG tablet Take 1 tablet (40 mg total) by mouth 2 (two) times daily. 01/28/15  Yes Hande, Vishwanath, MD  gabapentin (NEURONTIN) 800 MG tablet Take 800 mg by mouth every 8 (eight) hours.    Yes [provider]  glipiZIDE (GLUCOTROL) 10 MG tablet Take 10 mg by mouth 2 (two) times daily before a meal.   Yes [provider]  HUMULIN 70/30 KWIKPEN (70-30) 100 UNIT/ML PEN Inject 15 Units into the skin 2 (two) times daily. 01/13/18  Yes [provider]  isosorbide mononitrate (ISMO,MONOKET) 20 MG tablet Take 20 mg by mouth daily.  09/07/16  Yes [provider]  levothyroxine (SYNTHROID, LEVOTHROID) 112 MCG tablet Take 112 mcg by mouth daily before breakfast.    Yes [provider]  metoprolol succinate (TOPROL-XL) 25 MG 24 hr tablet Take 25 mg by mouth daily.  09/07/16  Yes [provider]  omeprazole (PRILOSEC) 20 MG capsule Take 20 mg by mouth 2 (two) times daily.   Yes [provider]  potassium chloride SA (K-DUR,KLOR-CON) 20 MEQ tablet Take 20 mEq by mouth daily.   Yes [provider]  rOPINIRole (REQUIP) 0.5 MG tablet Take 0.5 mg by mouth at bedtime.   Yes [provider]  senna-docusate (SENOKOT-S) 8.6-50 MG per tablet Take 1 tablet by mouth at bedtime as needed for mild constipation. 05/02/15  Yes Hande, Vishwanath, MD  simvastatin (ZOCOR) 40 MG tablet Take 40 mg by mouth at bedtime.    Yes [provider]  warfarin (COUMADIN) 1 MG tablet Take 1 tablet (1 mg total) by mouth daily at 6 PM. 05/02/15  Yes Hande, Vishwanath, MD  docusate sodium (COLACE) 100 MG capsule Take 1 capsule (100 mg total) by mouth 2 (two) times daily. Patient not taking: Reported on 02/20/2018 01/28/15   Barbette Reichmann, MD  HYDROcodone-acetaminophen (NORCO/VICODIN) 5-325 MG per tablet Take 1 tablet  by mouth every 6 (six) hours as needed for moderate pain. Patient not taking: Reported on 02/20/2018 05/02/15   Barbette Reichmann, MD  mupirocin ointment (BACTROBAN) 2 % Apply to wound twice a day. Patient not taking: Reported on 02/20/2018 04/22/16   Elinor Parkinson, [place]  ONE TOUCH ULTRA TEST test strip  08/12/16   [provider]      PHYSICAL EXAMINATION:   VITAL SIGNS: Blood pressure (!) 145/76, pulse 64, temperature 98.6 F (37 C), temperature source Oral, resp. rate 16, height 5\' 7"  (1.702 m), weight 96.6 kg (213 lb), SpO2 92 %.  GENERAL:  77 y.o.-year-old patient lying in the bed with no acute distress.  EYES: Pupils equal, round, reactive to light and accommodation. No scleral icterus. Extraocular muscles intact.  HEENT: Head atraumatic, normocephalic. Oropharynx and nasopharynx clear.  NECK:  Supple, no jugular venous distention. No thyroid enlargement, no tenderness.  LUNGS: Normal breath sounds bilaterally, no wheezing, rales,rhonchi or crepitation. No use of accessory muscles of respiration.  CARDIOVASCULAR: S1, S2 normal. No murmurs, rubs, or gallops.  ABDOMEN: Soft, nontender, nondistended. Bowel sounds present. No organomegaly or mass.  EXTREMITIES: No pedal edema, cyanosis, or clubbing.  NEUROLOGIC: Cranial nerves II through XII are intact. Muscle strength 5/5 in all extremities. Sensation intact. Gait not checked.  PSYCHIATRIC: The patient is alert and oriented x 3.  SKIN: No obvious rash, lesion, or ulcer.   LABORATORY PANEL:   CBC Recent Labs  Lab 02/20/18 1814  WBC 5.2  HGB 13.1  HCT 39.4  PLT 77*  MCV 106.9*  MCH 35.6*  MCHC 33.3  RDW 15.0*  LYMPHSABS 1.2  MONOABS 0.3  EOSABS 0.1  BASOSABS 0.1   ------------------------------------------------------------------------------------------------------------------  Chemistries  Recent Labs  Lab 02/20/18 1814  NA 138  K 3.8  CL 98  CO2 31  GLUCOSE 322*  BUN 39*  CREATININE 2.35*  CALCIUM 10.0   AST 48*  ALT 26  ALKPHOS 100  BILITOT 1.1   ------------------------------------------------------------------------------------------------------------------ estimated creatinine clearance is 23.2 mL/min (A) (by C-G formula based on SCr of 2.35 mg/dL (H)). ------------------------------------------------------------------------------------------------------------------ No results for input(s): TSH, T4TOTAL, T3FREE, THYROIDAB in the last 72 hours.  Invalid input(s): FREET3   Coagulation profile No results for input(s): INR, PROTIME in the last 168 hours. ------------------------------------------------------------------------------------------------------------------- No results for input(s): DDIMER in the last 72 hours. -------------------------------------------------------------------------------------------------------------------  Cardiac Enzymes Recent Labs  Lab 02/20/18 1814  TROPONINI 0.03*   ------------------------------------------------------------------------------------------------------------------ Invalid input(s): POCBNP  ---------------------------------------------------------------------------------------------------------------  Urinalysis    Component Value Date/Time   COLORURINE YELLOW (A) 02/20/2018 1814   APPEARANCEUR CLOUDY (A) 02/20/2018 1814   APPEARANCEUR Hazy 06/07/2013 1749   LABSPEC 1.010 02/20/2018 1814   LABSPEC 1.014 06/07/2013 1749   PHURINE 5.0 02/20/2018 1814   GLUCOSEU >=500 (A) 02/20/2018 1814   GLUCOSEU Negative 06/07/2013 1749   HGBUR SMALL (A) 02/20/2018 1814   BILIRUBINUR NEGATIVE 02/20/2018 1814   BILIRUBINUR Negative 06/07/2013 1749   KETONESUR NEGATIVE 02/20/2018 1814   PROTEINUR NEGATIVE 02/20/2018 1814   NITRITE NEGATIVE 02/20/2018 1814   LEUKOCYTESUR LARGE (A) 02/20/2018 1814   LEUKOCYTESUR Negative 06/07/2013 1749     RADIOLOGY: Ct Head Wo Contrast  Result Date: 02/20/2018 CLINICAL DATA:  Increasing weakness  EXAM: CT HEAD WITHOUT CONTRAST TECHNIQUE: Contiguous axial images were obtained from the base of the skull through the vertex without intravenous contrast. COMPARISON:  02/25/2017 FINDINGS: Brain: Chronic atrophic and ischemic changes are noted. No findings to suggest acute hemorrhage, acute infarction or space-occupying mass lesion are noted. Vascular: No hyperdense vessel or unexpected calcification. Skull: Normal. Negative for fracture or focal lesion. Sinuses/Orbits: No acute finding. Other: None. IMPRESSION: Chronic atrophic and ischemic changes without acute abnormality. Electronically Signed   By: Alcide CleverMark  Lukens M.D.   On: 02/20/2018 19:15   Dg Chest Port 1 View  Result Date: 02/20/2018 CLINICAL DATA:  Weakness and fever EXAM: PORTABLE CHEST 1 VIEW COMPARISON:  02/25/2017 FINDINGS: Post sternotomy changes. Mild cardiomegaly. No focal airspace disease or pleural effusion. Aortic atherosclerosis. No pneumothorax. IMPRESSION: No active disease.  Stable cardiomegaly. Electronically Signed   By: Jasmine PangKim  Fujinaga M.D.   On: 02/20/2018 20:01    EKG: Orders placed or performed during the hospital encounter of 02/20/18  . ED EKG 12-Lead  . ED EKG 12-Lead  . EKG 12-Lead  . EKG 12-Lead    IMPRESSION AND PLAN:  77 year old female patient with history of atrial fibrillation on  Coumadin, congestive heart failure, chronic kidney disease stage III, coronary artery disease,, hypertension, peripheral neuropathy presented to the emergency room for weakness.  -Urinary tract infection Start patient on IV Rocephin antibiotic Follow-up cultures  -Chronic atrial fibrillation Continue Coumadin for anticoagulation  -Chronic congestive heart failure Stable  -Chronic kidney disease stage III Monitor renal function  -DVT prophylaxis On anticoagulation with warfarin Follow-up INR  All the records are reviewed and case discussed with ED provider. Management plans discussed with the patient, family and they  are in agreement.  CODE STATUS: Full code Code Status History    Date Active Date Inactive Code Status Order ID Comments User Context   04/29/2015 1726 05/02/2015 2124 Full Code [id number]  Adrian Saran, MD Inpatient   01/25/2015 1633 01/28/2015 1855 Full Code [id number]  Milagros Loll, MD ED       TOTAL TIME TAKING CARE OF THIS PATIENT: 52 minutes.    Ihor Austin M.D on 02/20/2018 at 8:14 PM  Between 7am to 6pm - Pager - [phone]  After 6pm go to [email or web address] - password [email or web address] [facility]  [facility] [place] [facility]  Office  [phone]  CC: Primary care physician; Barbette Reichmann, MD

## 2018-02-21 LAB — GLUCOSE, CAPILLARY
Glucose-Capillary: 337 mg/dL — ABNORMAL HIGH (ref 70–99)
Glucose-Capillary: 199 mg/dL — ABNORMAL HIGH (ref 70–99)

## 2018-02-21 LAB — BASIC METABOLIC PANEL
Anion gap: 9 (ref 5–15)
BUN: 35 mg/dL — ABNORMAL HIGH (ref 8–23)
CO2: 28 mmol/L (ref 22–32)
CREATININE: 1.95 mg/dL — AB (ref 0.44–1.00)
Calcium: 8.8 mg/dL — ABNORMAL LOW (ref 8.9–10.3)
Chloride: 104 mmol/L (ref 98–111)
GFR calc Af Amer: 27 mL/min — ABNORMAL LOW (ref >60)
GFR, EST NON AFRICAN AMERICAN: 23 mL/min — AB (ref >60)
Glucose, Bld: 274 mg/dL — ABNORMAL HIGH (ref 70–99)
Potassium: 3.7 mmol/L (ref 3.5–5.1)
SODIUM: 141 mmol/L (ref 135–145)

## 2018-02-21 LAB — CBC
HCT: 34.3 % — ABNORMAL LOW (ref 35.0–47.0)
Hemoglobin: 11.6 g/dL — ABNORMAL LOW (ref 12.0–16.0)
MCH: 36 pg — ABNORMAL HIGH (ref 26.0–34.0)
MCHC: 33.9 g/dL (ref 32.0–36.0)
MCV: 106.4 fL — ABNORMAL HIGH (ref 80.0–100.0)
PLATELETS: 59 10*3/uL — AB (ref 150–440)
RBC: 3.23 MIL/uL — ABNORMAL LOW (ref 3.80–5.20)
RDW: 14.9 % — AB (ref 11.5–14.5)
WBC: 4.9 10*3/uL (ref 3.6–11.0)

## 2018-02-21 LAB — PROTIME-INR
INR: 2.26
Prothrombin Time: 24.8 seconds — ABNORMAL HIGH (ref 11.4–15.2)

## 2018-02-21 MED ORDER — WARFARIN SODIUM 1 MG PO TABS
1.0000 mg | ORAL_TABLET | ORAL | Status: DC
Start: 2018-02-26 — End: 2018-02-23

## 2018-02-21 MED ORDER — GABAPENTIN 300 MG PO CAPS
800.0000 mg | ORAL_CAPSULE | Freq: Two times a day (BID) | ORAL | Status: DC
  Administered 2018-02-21 – 2018-02-23 (×4): 800 mg via ORAL
  Filled 2018-02-21 (×4): qty 2

## 2018-02-21 MED ORDER — INSULIN ASPART 100 UNIT/ML ~~LOC~~ SOLN
0.0000 [IU] | Freq: Three times a day (TID) | SUBCUTANEOUS | Status: DC
  Administered 2018-02-21: 7 [IU] via SUBCUTANEOUS
  Administered 2018-02-22: 3 [IU] via SUBCUTANEOUS
  Administered 2018-02-22: 2 [IU] via SUBCUTANEOUS
  Administered 2018-02-22: 3 [IU] via SUBCUTANEOUS
  Filled 2018-02-21 (×4): qty 1

## 2018-02-21 MED ORDER — WARFARIN SODIUM 2 MG PO TABS
2.0000 mg | ORAL_TABLET | ORAL | Status: DC
Start: 2018-02-21 — End: 2018-02-23
  Administered 2018-02-21 – 2018-02-22 (×2): 2 mg via ORAL
  Filled 2018-02-21 (×3): qty 1

## 2018-02-21 MED ORDER — WARFARIN - PHARMACIST DOSING INPATIENT
Freq: Every day | Status: DC
  Administered 2018-02-21 – 2018-02-22 (×2)

## 2018-02-21 MED ORDER — INSULIN ASPART 100 UNIT/ML ~~LOC~~ SOLN
0.0000 [IU] | Freq: Every day | SUBCUTANEOUS | Status: DC
  Administered 2018-02-22: 2 [IU] via SUBCUTANEOUS
  Filled 2018-02-21: qty 1

## 2018-02-21 MED ORDER — INSULIN GLARGINE 100 UNIT/ML ~~LOC~~ SOLN
12.0000 [IU] | Freq: Every day | SUBCUTANEOUS | Status: DC
  Administered 2018-02-21 – 2018-02-22 (×2): 12 [IU] via SUBCUTANEOUS
  Filled 2018-02-21 (×2): qty 0.12

## 2018-02-21 MED ORDER — LEVOTHYROXINE SODIUM 112 MCG PO TABS
112.0000 ug | ORAL_TABLET | Freq: Every day | ORAL | Status: DC
  Administered 2018-02-22 – 2018-02-23 (×2): 112 ug via ORAL
  Filled 2018-02-21 (×2): qty 1

## 2018-02-21 MED ORDER — INSULIN ASPART 100 UNIT/ML ~~LOC~~ SOLN
3.0000 [IU] | Freq: Three times a day (TID) | SUBCUTANEOUS | Status: DC
  Administered 2018-02-21 – 2018-02-22 (×4): 3 [IU] via SUBCUTANEOUS
  Filled 2018-02-21 (×4): qty 1

## 2018-02-21 MED ORDER — INSULIN ASPART 100 UNIT/ML ~~LOC~~ SOLN
0.0000 [IU] | Freq: Three times a day (TID) | SUBCUTANEOUS | Status: DC
Start: 2018-02-21 — End: 2018-02-21

## 2018-02-21 MED ORDER — ISOSORBIDE MONONITRATE 20 MG PO TABS
20.0000 mg | ORAL_TABLET | Freq: Every day | ORAL | Status: DC
  Administered 2018-02-22 – 2018-02-23 (×2): 20 mg via ORAL
  Filled 2018-02-21 (×2): qty 1

## 2018-02-21 NOTE — Progress Notes (Signed)
Inpatient Diabetes Program Recommendations  AACE/ADA: New Consensus Statement on Inpatient Glycemic Control (2015)  Target Ranges:  Prepandial:   less than 140 mg/dL      Peak postprandial:   less than 180 mg/dL (1-2 hours)      Critically ill patients:  140 - 180 mg/dL   Lab Results  Component Value Date   GLUCAP 200 (H) 02/20/2018    Review of Glycemic ControlResults for [name], Haley Hill (MRN [id number]) as of 02/21/2018 11:19  Ref. Range 02/20/2018 21:25  Glucose-Capillary Latest Ref Range: 70 - 99 mg/dL 409200 (H)   Results for [name], Haley Hill (MRN [id number]) as of 02/21/2018 11:19  Ref. Range 02/20/2018 18:14 02/21/2018 04:27  Glucose Latest Ref Range: 70 - 99 mg/dL 956322 (H) 213274 (H)   Diabetes history: Type 2 DM  Outpatient Diabetes medications: Humulin 70/30 15 units bid, Glipizide 10 mg bid Current orders for Inpatient glycemic control:  Glipizide 10 mg bid  Inpatient Diabetes Program Recommendations:    Please add Glycemic control order set.  Consider A1C. Also please add Novolog sensitive correction tid with meals and HS.  She will also likely need Lantus 12 units daily and Novolog meal coverage 3 units tid with meals (hold if patient eats less than 50%). Will text page MD.   Thanks,  Beryl MeagerJenny Lino Wickliff, RN, BC-ADM Inpatient Diabetes Coordinator Pager [phone]

## 2018-02-21 NOTE — Progress Notes (Signed)
ANTICOAGULATION CONSULT NOTE - Initial Consult  Pharmacy Consult for warfarin Indication: atrial fibrillation  Allergies  Allergen Reactions  . Ciprofloxacin Itching and Rash  . Fosamax [Alendronate Sodium] Other (See Comments)    Reaction: Ulcers in mouth and esophagus  . Iron Other (See Comments)    Patient Measurements: Height: 5\' 7"  (170.2 cm) Weight: 215 lb 13.3 oz (97.9 kg) IBW/kg (Calculated) : 61.6   Vital Signs: Temp: 97.5 F (36.4 C) (07/01 1341) Temp Source: Oral (07/01 1341) BP: 120/70 (07/01 1341) Pulse Rate: 40 (07/01 1341)  Labs: Recent Labs    02/20/18 1814 02/21/18 0427  HGB 13.1 11.6*  HCT 39.4 34.3*  PLT 77* 59*  LABPROT 23.3* 24.8*  INR 2.09 2.26  CREATININE 2.35* 1.95*  TROPONINI 0.03*  --     Estimated Creatinine Clearance: 28.1 mL/min (A) (by C-G formula based on SCr of 1.95 mg/dL (H)).   Medical History: Past Medical History:  Diagnosis Date  . Atrial fibrillation (HCC)   . Atrial flutter (HCC)   . CHF (congestive heart failure) (HCC)   . CKD (chronic kidney disease) stage 3, GFR 30-59 ml/min (HCC)   . Coronary disease   . Dehydration   . Diabetes (HCC)   . Fracture of humeral shaft, right, closed   . Gout   . Hypertension   . Hypothyroidism   . Peripheral neuropathy   . Renal insufficiency   . Right renal artery stenosis (HCC)   . Syncope and collapse   . Vitiligo      Assessment: 77 yo female continuing on warfarin. Confirmed home regimen with daughter-in-law who states pt takes 2 mg Mon thru Fri and 1 mg Sat/Sun. She states pt's last dose was Saturday night.   Goal of Therapy:  INR 2-3 Monitor platelets by anticoagulation protocol: Yes   Plan:  INR therapeutic today. Appears pt may have missed dose last night but INR trended up today to 2.26. Will continue home regimen for now. F/u INR in AM. F/u CBC in AM as well.   Crist FatWang, Maryfer Tauzin L 02/21/2018,3:50 PM

## 2018-02-21 NOTE — Evaluation (Signed)
Physical Therapy Evaluation Patient Details Name: Haley Hill MRN: [id number] DOB: [birth year] Today's Date: 02/21/2018   History of Present Illness  Pt presents to ED via EMS for weakness and abnormalities of gait. Pt subsequently diagnosed with a UTI. Pt has a past medical history that includes A-fib, CHF, CKD, DM, and HTN    Clinical Impression  Pt is a pleasant 77 year old female who was admitted for a UTI. Pt performs bed mobility with modified independence, transfers and ambulation with min assist and a RW. Pt demonstrates deficits with strength, ROM, and endurance. Pt amb 3' with RW and min assist to chair beside bed. Pt instructed in there-ex to increase LE strength. Pt tolerated treatment well however fatigued easily during session. Pt demonstrates inconsistent strength when comparing isolated MMT to functional abilities. Pt could benefit from continued skilled therapy at this time to improve deficits toward PLOF. PT will continue to work with pt at least 2x/week while admitted. D/c recommendations at this time are SNF to work toward PLOF. This entire session was guided, instructed, and directly supervised by Elizabeth Palau, DPT.       Follow Up Recommendations SNF    Equipment Recommendations  None recommended by PT    Recommendations for Other Services       Precautions / Restrictions Precautions Precautions: None Restrictions Weight Bearing Restrictions: No      Mobility  Bed Mobility Overal bed mobility: Modified Independent             General bed mobility comments: pt performs without physical assistance however requires increased UE support and time to perform  Transfers Overall transfer level: Needs assistance Equipment used: Rolling walker (2 wheeled) Transfers: Sit to/from Stand Sit to Stand: Min assist         General transfer comment: Pt requires min assist to transfer sit/stand at RW. Requires increased time and verbal cuing for sequencing and  hand placement. able to maintain upright posture with standing with CGA to min assist  Ambulation/Gait Ambulation/Gait assistance: Min assist Gait Distance (Feet): 3 Feet Assistive device: Rolling walker (2 wheeled) Gait Pattern/deviations: Shuffle     General Gait Details: pt requires min assist to amb 3' to chair beside bed. Pt uses RW safely and requires verbal cuing for safety and sequencing. able to perform pre gait marching exercises. Declined furthur distances at this time due to fatigue.  Stairs            Wheelchair Mobility    Modified Rankin (Stroke Patients Only)       Balance Overall balance assessment: Needs assistance   Sitting balance-Leahy Scale: Good Sitting balance - Comments: pt able to sit EOB with feet flat on floor, no UE support for 2 minutes without LOB     Standing balance-Leahy Scale: Fair Standing balance comment: Pt requires B UE support to maintain standing balance. Requires CGA to min assist with no gross LOB                             Pertinent Vitals/Pain Pain Assessment: No/denies pain    Home Living Family/patient expects to be discharged to:: Private residence Living Arrangements: Children;Other relatives(son, daughter in law, grandchildren) Available Help at Discharge: Family(Unclear of amount of assistance available.) Type of Home: House Home Access: Stairs to enter Entrance Stairs-Rails: Doctor, general practice of Steps: 5 Home Layout: One level Home Equipment: Environmental consultant - 2 wheels      Prior  Function Level of Independence: Independent with assistive device(s)         Comments: amb with 2WW. no history of falls     Hand Dominance        Extremity/Trunk Assessment   Upper Extremity Assessment Upper Extremity Assessment: Overall WFL for tasks assessed(sensation WNL)    Lower Extremity Assessment Lower Extremity Assessment: RLE deficits/detail;LLE deficits/detail RLE Deficits / Details:  Grossly at least 4/5 during MMT; hip flex, knee flex/ext, DF 4+/5 RLE Sensation: decreased light touch(on feet) LLE Deficits / Details: Grossly at least 4/5 during MMT; hip flex, knee flex/ext, DF 4+/5 LLE Sensation: decreased light touch(on feet)       Communication   Communication: Expressive difficulties(word finding difficulties)  Cognition Arousal/Alertness: Awake/alert Behavior During Therapy: WFL for tasks assessed/performed Overall Cognitive Status: Within Functional Limits for tasks assessed                                 General Comments: pt displays word finding difficulties which makes assessing cognition difficult      General Comments      Exercises Other Exercises Other Exercises: Pt instructed in LE strengthening exercises including SLR, LAQ, seated marching, and standing marching x10 bilaterally. pt tolerated well   Assessment/Plan    PT Assessment Patient needs continued PT services  PT Problem List Decreased strength;Decreased range of motion;Decreased activity tolerance;Decreased mobility;Decreased balance;Decreased coordination;Decreased cognition;Decreased knowledge of use of DME       PT Treatment Interventions DME instruction;Stair training;Gait training;Functional mobility training;Therapeutic activities;Therapeutic exercise;Balance training;Neuromuscular re-education    PT Goals (Current goals can be found in the Care Plan section)  Acute Rehab PT Goals Patient Stated Goal: to stop using the walker PT Goal Formulation: With patient Time For Goal Achievement: 03/07/18 Potential to Achieve Goals: Good    Frequency Min 2X/week   Barriers to discharge        Co-evaluation               AM-PAC PT "6 Clicks" Daily Activity  Outcome Measure Difficulty turning over in bed (including adjusting bedclothes, sheets and blankets)?: A Little Difficulty moving from lying on back to sitting on the side of the bed? : A  Little Difficulty sitting down on and standing up from a chair with arms (e.g., wheelchair, bedside commode, etc,.)?: Unable Help needed moving to and from a bed to chair (including a wheelchair)?: A Little Help needed walking in hospital room?: A Little Help needed climbing 3-5 steps with a railing? : A Little 6 Click Score: 16    End of Session Equipment Utilized During Treatment: Gait belt Activity Tolerance: Patient tolerated treatment well Patient left: in chair;with call bell/phone within reach;with chair alarm set;with nursing/sitter in room;with SCD's reapplied Nurse Communication: Mobility status PT Visit Diagnosis: Unsteadiness on feet (R26.81);Other abnormalities of gait and mobility (R26.89);Muscle weakness (generalized) (M62.81);Difficulty in walking, not elsewhere classified (R26.2)    Time: [id number] PT Time Calculation (min) (ACUTE ONLY): 33 min   Charges:         PT G Codes:        Haley Hill, SPT   Haley Hill 02/21/2018, 10:30 AM

## 2018-02-21 NOTE — Care Management Obs Status (Signed)
MEDICARE OBSERVATION STATUS NOTIFICATION   Patient Details  Name: Haley Hill MRN: [id number] Date of Birth: [birth year]   Medicare Observation Status Notification Given:  Yes    [name] [name], RN 02/21/2018, 11:24 AM

## 2018-02-21 NOTE — Progress Notes (Signed)
[facility] [place] at [facility]   PATIENT NAME: Haley Hill    MR#:  [id number]  DATE OF BIRTH:  August 29, 1940  SUBJECTIVE:  CHIEF COMPLAINT: Patient is resting comfortably.  Wife at bedside.  Denies any abdominal pain nausea vomiting  REVIEW OF SYSTEMS:  CONSTITUTIONAL: No fever, fatigue or weakness.  EYES: No blurred or double vision.  EARS, NOSE, AND THROAT: No tinnitus or ear pain.  RESPIRATORY: No cough, shortness of breath, wheezing or hemoptysis.  CARDIOVASCULAR: No chest pain, orthopnea, edema.  GASTROINTESTINAL: No nausea, vomiting, diarrhea or abdominal pain.  GENITOURINARY: No dysuria, hematuria.  ENDOCRINE: No polyuria, nocturia,  HEMATOLOGY: No anemia, easy bruising or bleeding SKIN: No rash or lesion. MUSCULOSKELETAL: No joint pain or arthritis.   NEUROLOGIC: No tingling, numbness, weakness.  PSYCHIATRY: No anxiety or depression.   DRUG ALLERGIES:   Allergies  Allergen Reactions  . Ciprofloxacin Itching and Rash  . Fosamax [Alendronate Sodium] Other (See Comments)    Reaction: Ulcers in mouth and esophagus  . Iron Other (See Comments)    VITALS:  Blood pressure 120/70, pulse (!) 40, temperature (!) 97.5 F (36.4 C), temperature source Oral, resp. rate 20, height 5\' 7"  (1.702 m), weight 97.9 kg (215 lb 13.3 oz), SpO2 96 %.  PHYSICAL EXAMINATION:  GENERAL:  77 y.o.-year-old patient lying in the bed with no acute distress.  EYES: Pupils equal, round, reactive to light and accommodation. No scleral icterus. Extraocular muscles intact.  HEENT: Head atraumatic, normocephalic. Oropharynx and nasopharynx clear.  NECK:  Supple, no jugular venous distention. No thyroid enlargement, no tenderness.  LUNGS: Normal breath sounds bilaterally, no wheezing, rales,rhonchi or crepitation. No use of accessory muscles of respiration.  CARDIOVASCULAR: S1, S2 normal. No murmurs, rubs, or gallops.  ABDOMEN: Soft, nontender, nondistended. Bowel sounds  present. No organomegaly or mass.  EXTREMITIES: No pedal edema, cyanosis, or clubbing.  NEUROLOGIC: Cranial nerves II through XII are intact. Muscle strength 5/5 in all extremities. Sensation intact. Gait not checked.  PSYCHIATRIC: The patient is alert and oriented x 3.  SKIN: No obvious rash, lesion, or ulcer.    LABORATORY PANEL:   CBC Recent Labs  Lab 02/21/18 0427  WBC 4.9  HGB 11.6*  HCT 34.3*  PLT 59*   ------------------------------------------------------------------------------------------------------------------  Chemistries  Recent Labs  Lab 02/20/18 1814 02/21/18 0427  NA 138 141  K 3.8 3.7  CL 98 104  CO2 31 28  GLUCOSE 322* 274*  BUN 39* 35*  CREATININE 2.35* 1.95*  CALCIUM 10.0 8.8*  AST 48*  --   ALT 26  --   ALKPHOS 100  --   BILITOT 1.1  --    ------------------------------------------------------------------------------------------------------------------  Cardiac Enzymes Recent Labs  Lab 02/20/18 1814  TROPONINI 0.03*   ------------------------------------------------------------------------------------------------------------------  RADIOLOGY:  Ct Head Wo Contrast  Result Date: 02/20/2018 CLINICAL DATA:  Increasing weakness EXAM: CT HEAD WITHOUT CONTRAST TECHNIQUE: Contiguous axial images were obtained from the base of the skull through the vertex without intravenous contrast. COMPARISON:  02/25/2017 FINDINGS: Brain: Chronic atrophic and ischemic changes are noted. No findings to suggest acute hemorrhage, acute infarction or space-occupying mass lesion are noted. Vascular: No hyperdense vessel or unexpected calcification. Skull: Normal. Negative for fracture or focal lesion. Sinuses/Orbits: No acute finding. Other: None. IMPRESSION: Chronic atrophic and ischemic changes without acute abnormality. Electronically Signed   By: Alcide CleverMark  Lukens M.D.   On: 02/20/2018 19:15   Dg Chest Port 1 View  Result Date: 02/20/2018 CLINICAL DATA:  Weakness  and  fever EXAM: PORTABLE CHEST 1 VIEW COMPARISON:  02/25/2017 FINDINGS: Post sternotomy changes. Mild cardiomegaly. No focal airspace disease or pleural effusion. Aortic atherosclerosis. No pneumothorax. IMPRESSION: No active disease.  Stable cardiomegaly. Electronically Signed   By: Jasmine Pang M.D.   On: 02/20/2018 20:01    EKG:   Orders placed or performed during the hospital encounter of 02/20/18  . ED EKG 12-Lead  . ED EKG 12-Lead  . EKG 12-Lead  . EKG 12-Lead    ASSESSMENT AND PLAN:    77 year old female patient with history of atrial fibrillation on Coumadin, congestive heart failure, chronic kidney disease stage III, coronary artery disease,, hypertension, peripheral neuropathy presented to the emergency room for weakness.  -Urinary tract infection  patient on IV Rocephin antibiotic Follow-up cultures Gentle hydration with IV fluids  -Chronic atrial fibrillation Continue Coumadin for anticoagulation and INR is therapeutic at 2.26 Coumadin management by pharmacy  -Chronic congestive heart failure Stable.  No fluid overload noticed  -acute on Chronic kidney disease stage III Monitor renal function, patient creatinine at 1.75 and 2018 today he is at 1.95 which has trended down from 2.35  -DVT prophylaxis On anticoagulation with warfarin.  INR therapeutic at 2.26 Follow-up INR       All the records are reviewed and case discussed with Care Management/Social Workerr. Management plans discussed with the patient, family and they are in agreement.  CODE STATUS: fc  TOTAL TIME TAKING CARE OF THIS PATIENT: 36 minutes.   POSSIBLE D/C IN 1-2DAYS, DEPENDING ON CLINICAL CONDITION.  Note: This dictation was prepared with Dragon dictation along with smaller phrase technology. Any transcriptional errors that result from this process are unintentional.   Ramonita Lab M.D on 02/21/2018 at 2:43 PM  Between 7am to 6pm - Pager - [phone] After 6pm go to [email or web address] -  password [email or web address] [facility]  [facility]  Office  [phone]  CC: Primary care physician; Barbette Reichmann, MD

## 2018-02-21 NOTE — [place] FL2 (Signed)
[place] MEDICAID FL2 LEVEL OF CARE SCREENING TOOL     IDENTIFICATION  Patient Name: Haley RiggerJean S Hill Birthdate: 1940-11-12 Sex: female Admission Date (Current Location): 02/20/2018  [place]   Facility and Address:  [facility], [address], [place], [place] [address]      Provider Number: [id number]  Attending Physician Name and Address:  Ramonita LabGouru, Aruna, MD  Relative Name and Phone Number:  Tommie ArdKelly Mcclaren [phone]    Current Level of Care: Hospital Recommended Level of Care: Skilled Nursing Facility Prior Approval Number:    Date Approved/Denied:   PASRR Number: [id number]  Discharge Plan: SNF    Current Diagnoses: Patient Active Problem List   Diagnosis Date Noted  . UTI (urinary tract infection) 02/20/2018  . Hypoxia 04/29/2015  . Chronic kidney disease (CKD), stage III (moderate) (HCC) 03/07/2015  . ARF (acute renal failure) (HCC) 01/25/2015  . Fracture of humeral shaft, right, closed 01/25/2015  . Acute renal failure (HCC) 01/25/2015  . Closed fracture of humerus, shaft 01/25/2015  . Congestive heart failure (HCC) 09/25/2014  . AI (aortic incompetence) 02/07/2014  . Edema leg 02/07/2014  . History of cardiac catheterization 02/07/2014  . Renal artery stenosis (HCC) 02/07/2014  . H/O coronary artery bypass surgery 02/07/2014  . Atrial fibrillation (HCC) 01/04/2014  . Arteriosclerosis of coronary artery 01/04/2014  . Type 2 diabetes mellitus (HCC) 01/04/2014  . BP (high blood pressure) 01/04/2014  . HLD (hyperlipidemia) 01/04/2014  . Adult hypothyroidism 01/04/2014  . Neuropathy 01/04/2014    Orientation RESPIRATION BLADDER Height & Weight     Self, Time, Situation, Place  Normal Continent Weight: 215 lb 13.3 oz (97.9 kg) Height:  5\' 7"  (170.2 cm)  BEHAVIORAL SYMPTOMS/MOOD NEUROLOGICAL BOWEL NUTRITION STATUS  (None) (none) Continent Diet(Heart Healthy )  AMBULATORY STATUS COMMUNICATION OF NEEDS  Skin   Extensive Assist Verbally Normal                       Personal Care Assistance Level of Assistance  Bathing, Feeding, Dressing Bathing Assistance: Limited assistance Feeding assistance: Independent Dressing Assistance: Limited assistance     Functional Limitations Info  Sight, Hearing, Speech Sight Info: Adequate Hearing Info: Adequate Speech Info: Adequate    SPECIAL CARE FACTORS FREQUENCY  PT (By licensed PT), OT (By licensed OT)                    Contractures Contractures Info: Not present    Additional Factors Info  Code Status, Allergies Code Status Info: Full Code  Allergies Info: Ciprofloxacin, Fosamax , Iron           Current Medications (02/21/2018):  This is the current hospital active medication list Current Facility-Administered Medications  Medication Dose Route Frequency Provider Last Rate Last Dose  . 0.9 %  sodium chloride infusion   Intravenous Continuous Pyreddy, Vivien RotaPavan, MD   Stopped at 02/21/18 1105  . acetaminophen (TYLENOL) tablet 650 mg  650 mg Oral Q6H PRN Ihor AustinPyreddy, Pavan, MD       Or  . acetaminophen (TYLENOL) suppository 650 mg  650 mg Rectal Q6H PRN Pyreddy, Vivien RotaPavan, MD      . allopurinol (ZYLOPRIM) tablet 300 mg  300 mg Oral Daily Pyreddy, Vivien RotaPavan, MD   300 mg at 02/21/18 0946  . cefTRIAXone (ROCEPHIN) 1 g in sodium chloride 0.9 % 100 mL IVPB  1 g Intravenous Q24H Pyreddy, Vivien RotaPavan, MD      . cholecalciferol (VITAMIN  D) tablet 2,000 Units  2,000 Units Oral Daily Ihor Austin, MD   2,000 Units at 02/21/18 0945  . cloNIDine (CATAPRES) tablet 0.1 mg  0.1 mg Oral Q8H Pyreddy, Pavan, MD   0.1 mg at 02/21/18 0557  . docusate sodium (COLACE) capsule 100 mg  100 mg Oral BID Ihor Austin, MD   100 mg at 02/21/18 0945  . ferrous sulfate tablet 325 mg  325 mg Oral Q breakfast Ihor Austin, MD   325 mg at 02/21/18 0946  . gabapentin (NEURONTIN) capsule 800 mg  800 mg Oral Q8H Pyreddy, Pavan, MD   800 mg at 02/21/18 0557  . glipiZIDE  (GLUCOTROL) tablet 10 mg  10 mg Oral BID AC Pyreddy, Vivien Rota, MD   10 mg at 02/21/18 0946  . HYDROcodone-acetaminophen (NORCO/VICODIN) 5-325 MG per tablet 1 tablet  1 tablet Oral Q6H PRN Pyreddy, Pavan, MD      . insulin aspart (novoLOG) injection 0-9 Units  0-9 Units Subcutaneous TID WC Gouru, Aruna, MD      . isosorbide mononitrate (ISMO,MONOKET) tablet 20 mg  20 mg Oral BID Ihor Austin, MD   20 mg at 02/21/18 0947  . levothyroxine (SYNTHROID, LEVOTHROID) tablet 100 mcg  100 mcg Oral QAC breakfast Ihor Austin, MD   100 mcg at 02/21/18 0557  . metoprolol succinate (TOPROL-XL) 24 hr tablet 25 mg  25 mg Oral Daily Pyreddy, Pavan, MD      . ondansetron (ZOFRAN) tablet 4 mg  4 mg Oral Q6H PRN Pyreddy, Vivien Rota, MD       Or  . ondansetron (ZOFRAN) injection 4 mg  4 mg Intravenous Q6H PRN Pyreddy, Pavan, MD      . pantoprazole (PROTONIX) EC tablet 40 mg  40 mg Oral Daily Pyreddy, Vivien Rota, MD   40 mg at 02/21/18 0946  . rOPINIRole (REQUIP) tablet 0.5 mg  0.5 mg Oral QHS Pyreddy, Vivien Rota, MD   0.5 mg at 02/20/18 2201  . senna-docusate (Senokot-S) tablet 1 tablet  1 tablet Oral QHS PRN Ihor Austin, MD      . simvastatin (ZOCOR) tablet 40 mg  40 mg Oral QHS Ihor Austin, MD   40 mg at 02/20/18 2201  . vitamin B-12 (CYANOCOBALAMIN) tablet 500 mcg  500 mcg Oral Daily Pyreddy, Vivien Rota, MD   500 mcg at 02/21/18 0945  . warfarin (COUMADIN) tablet 1 mg  1 mg Oral q1800 Pyreddy, Vivien Rota, MD      . Warfarin - Physician Dosing Inpatient   Does not apply q1800 Gardner Candle, [facility]         Discharge Medications: Please see discharge summary for a list of discharge medications.  Relevant Imaging Results:  Relevant Lab Results:   Additional Information SSN [id number]  Ruthe Mannan, [place]

## 2018-02-21 NOTE — Care Management Note (Signed)
Case Management Note  Patient Details  Name: Haley RiggerJean S Hill MRN: [id number] Date of Birth: 10/25/1940  Subjective/Objective:  Patient admitted to [facility] under observation status for UTI. RNCM consulted on patient to provide MOON letter and complete assessment. Spoke with patient and daughter in law [name] [phone]. Patient and family voice concerns over increasing weakness and inability to complete activities of daily living without heavy assistance. Patient has a walker in the home. Patient lives with son and daughter in law.PT has worked with the patient and is currently recommending SNF at discharge. Patient unsure about what her preference would be at this time as it pertains to discharge planning. Patient currently open with [facility] services. Confirmed with Maralyn SagoSarah from [facility] that patient is active with PT and nursing services. Family provides transport. PCP is Dr Marcello FennelHande.                    Action/Plan:  RNCM to continue to follow for any needs.  Expected Discharge Date:                  Expected Discharge Plan:     In-House Referral:     Discharge planning Services     Post Acute Care Choice:    Choice offered to:     DME Arranged:    DME Agency:     HH Arranged:    HH Agency:     Status of Service:     If discussed at [employer] of [employer] Meetings, dates discussed:    Additional Comments:  Edwinna AreolaJosh A Vincent Ehrler, RN 02/21/2018, 11:15 AM

## 2018-02-21 NOTE — Progress Notes (Signed)
Heart rate has been in the 40s-50s all day and patient is asymptomatic.  Morning dose of metorpolol held and Dr. Amado CoeGouru made aware.  Verbal order to modify metoprolol order to be held for heart rate less than 60, otherwise no new orders.  Orson Apeanielle Meryn Sarracino, RN

## 2018-02-21 NOTE — Clinical Social Work Note (Signed)
Clinical Social Work Assessment  Patient Details  Name: Haley Hill MRN: [id number] Date of Birth: [birth year]  Date of referral:  02/21/18               Reason for consult:  Facility Placement                Permission sought to share information with:  Facility Sport and exercise psychologist, Tourist information centre manager, Family Supports Permission granted to share information::  Yes, Verbal Permission Granted  Name::      SNF  Agency::   [place]   Relationship::     Contact Information:     Housing/Transportation Living arrangements for the past 2 months:  [place] of Information:  Patient Patient Interpreter Needed:  None Criminal Activity/Legal Involvement Pertinent to Current Situation/Hospitalization:  No - Comment as needed Significant Relationships:  Adult Children Lives with:  Adult Children Do you feel safe going back to the place where you live?  Yes Need for family participation in patient care:  No (Coment)  Care giving concerns:  Patient lives with her son and daughter in law in [place]    Social Worker assessment / plan:  CSW noted that patient will need SNF. CSW met with patient. CSW introduced self and explained role. Patient states that she lives with her son and daughter in law. CSW explained PT recommendation and patient states that she would prefer to return home but does not want to be a burden on her family. Patient states that she would be willing to go to SNF at discharge and her preference is [facility] CSW explained bed search process. Patient is in agreement with bed search. CSW will initiate bed search and give bed offers once received.   Employment status:  Retired Nurse, adult PT Recommendations:  [place] / Referral to community resources:  [place]  Patient/Family's Response to care:  Patient thanked CSW for coming to speak with her   Patient/Family's  Understanding of and Emotional Response to Diagnosis, Current Treatment, and Prognosis:  Patient states that she understands her current prognosis and discharge plan   Emotional Assessment Appearance:  Appears stated age Attitude/Demeanor/Rapport:    Affect (typically observed):  Accepting, Calm, Pleasant Orientation:  Oriented to Self, Oriented to Place, Oriented to  Time, Oriented to Situation Alcohol / Substance use:  Not Applicable Psych involvement (Current and /or in the community):  No (Comment)  Discharge Needs  Concerns to be addressed:  Discharge Planning Concerns Readmission within the last 30 days:  No Current discharge risk:  None Barriers to Discharge:  Continued Medical Work up   [employer], [place] 02/21/2018, 2:12 PM

## 2018-02-22 ENCOUNTER — Encounter
Admission: RE | Admit: 2018-02-22 | Discharge: 2018-02-22 | Disposition: A | Discharge status: Home / Self Care | Payer: Medicare Other | Source: Ambulatory Visit | Attending: Internal Medicine | Admitting: Internal Medicine

## 2018-02-22 LAB — CBC
HEMATOCRIT: 34.9 % — AB (ref 35.0–47.0)
HEMOGLOBIN: 11.9 g/dL — AB (ref 12.0–16.0)
MCH: 36.1 pg — AB (ref 26.0–34.0)
MCHC: 34 g/dL (ref 32.0–36.0)
MCV: 106.4 fL — AB (ref 80.0–100.0)
Platelets: 53 10*3/uL — ABNORMAL LOW (ref 150–440)
RBC: 3.28 MIL/uL — ABNORMAL LOW (ref 3.80–5.20)
RDW: 14.3 % (ref 11.5–14.5)
WBC: 5.4 10*3/uL (ref 3.6–11.0)

## 2018-02-22 LAB — BASIC METABOLIC PANEL
ANION GAP: 7 (ref 5–15)
BUN: 29 mg/dL — ABNORMAL HIGH (ref 8–23)
CALCIUM: 8.7 mg/dL — AB (ref 8.9–10.3)
CHLORIDE: 100 mmol/L (ref 98–111)
CO2: 29 mmol/L (ref 22–32)
Creatinine, Ser: 1.71 mg/dL — ABNORMAL HIGH (ref 0.44–1.00)
GFR calc Af Amer: 32 mL/min — ABNORMAL LOW (ref >60)
GFR calc non Af Amer: 27 mL/min — ABNORMAL LOW (ref >60)
Glucose, Bld: 195 mg/dL — ABNORMAL HIGH (ref 70–99)
Potassium: 4.2 mmol/L (ref 3.5–5.1)
SODIUM: 136 mmol/L (ref 135–145)

## 2018-02-22 LAB — GLUCOSE, CAPILLARY
GLUCOSE-CAPILLARY: 226 mg/dL — AB (ref 70–99)
GLUCOSE-CAPILLARY: 205 mg/dL — AB (ref 70–99)
Glucose-Capillary: 200 mg/dL — ABNORMAL HIGH (ref 70–99)
Glucose-Capillary: 202 mg/dL — ABNORMAL HIGH (ref 70–99)

## 2018-02-22 LAB — PROTIME-INR
INR: 2.15
PROTHROMBIN TIME: 23.8 s — AB (ref 11.4–15.2)

## 2018-02-22 LAB — HEMOGLOBIN A1C
HEMOGLOBIN A1C: 9.3 % — AB (ref 4.8–5.6)
Mean Plasma Glucose: 220.21 mg/dL

## 2018-02-22 MED ORDER — SENNOSIDES-DOCUSATE SODIUM 8.6-50 MG PO TABS
1.0000 | ORAL_TABLET | Freq: Two times a day (BID) | ORAL | Status: DC
  Administered 2018-02-22 – 2018-02-23 (×2): 1 via ORAL
  Filled 2018-02-22 (×2): qty 1

## 2018-02-22 NOTE — Clinical Social Work Note (Signed)
CSW gave patient bed offers. Patient [facility]. CSW notified Ladona Ridgelaylor at [facility] of bed acceptance. [facility]. CSW will continue to follow for discharge plan.   Ruthe Mannanandace Zaidyn Claire MSW, [address] [phone]

## 2018-02-22 NOTE — Progress Notes (Signed)
Physical Therapy Treatment Patient Details Name: Haley Hill MRN: [id number] DOB: 1940-09-21 Today's Date: 02/22/2018    History of Present Illness Pt presents to ED via EMS for weakness and abnormalities of gait. Pt subsequently diagnosed with a UTI. Pt has a past medical history that includes A-fib, CHF, CKD, DM, and HTN    PT Comments    Pt sitting on BSC next to bed upon arrival. Pt min assist to stand pivot transfer using RW back onto bed when finished. Pt states that she is doing well today however feels weak. Pt instructed in LE strengthening exercises which she tolerated well. Pt amb 10' with min assist, RW, and a chair follow. Pt fatigued easily during gait. At this time pt continues to be limited by fatigue and states that she can tell she is weak during exercise and amb. Pt does demonstrate the ability to use RW safely and demonstrates good safety awareness. Pt could benefit from continued skilled therapy at this time to improve deficits toward PLOF. PT will continue to work with pt at least 2x/week while admitted. D/c recommendations continue to be SNF.    Follow Up Recommendations  SNF     Equipment Recommendations  None recommended by PT    Recommendations for Other Services       Precautions / Restrictions Precautions Precautions: None Restrictions Weight Bearing Restrictions: No    Mobility  Bed Mobility               General bed mobility comments: Not assessed this session pt on BSC upon arrival and preferred to end up in chair upon completion  Transfers Overall transfer level: Needs assistance Equipment used: Rolling walker (2 wheeled) Transfers: Sit to/from Stand Sit to Stand: Min assist         General transfer comment: Pt requires min assist to transfer sit/stand at RW. Requires increased time and verbal cuing for sequencing and hand placement. able to maintain upright posture with standing with CGA to min assist. Pt able to transfer sit>stand  from elevated surface with CGA. Pt initally stand pivot transfers from [facility] to bed with min assist before beginning gait exercises.  Ambulation/Gait Ambulation/Gait assistance: Min assist Gait Distance (Feet): 10 Feet Assistive device: Rolling walker (2 wheeled) Gait Pattern/deviations: Shuffle;Trunk flexed     General Gait Details: pt requires min assist, RW, and chair follow to amb 10' in room. Pt cued for upright posture which she is able to correct however cannot maintain during gait. Pt fatigues easily and request to sit after 10'.   Stairs             Wheelchair Mobility    Modified Rankin (Stroke Patients Only)       Balance                                            Cognition Arousal/Alertness: Awake/alert Behavior During Therapy: WFL for tasks assessed/performed Overall Cognitive Status: Within Functional Limits for tasks assessed                                 General Comments: pt displays word finding difficulties which makes assessing cognition difficult      Exercises Other Exercises Other Exercises: Pt instructed in LE strengthening exercises including SLR, LAQ, seated marching, and hip abd/add x15  bilaterally. pt tolerated well    General Comments        Pertinent Vitals/Pain Pain Assessment: No/denies pain    Home Living                      Prior Function            PT Goals (current goals can now be found in the care plan section) Acute Rehab PT Goals Patient Stated Goal: to stop using the walker PT Goal Formulation: With patient Time For Goal Achievement: 03/07/18 Potential to Achieve Goals: Good Progress towards PT goals: Progressing toward goals    Frequency    Min 2X/week      PT Plan Current plan remains appropriate    Co-evaluation              AM-PAC PT "6 Clicks" Daily Activity  Outcome Measure  Difficulty turning over in bed (including adjusting bedclothes, sheets  and blankets)?: A Little Difficulty moving from lying on back to sitting on the side of the bed? : A Little Difficulty sitting down on and standing up from a chair with arms (e.g., wheelchair, bedside commode, etc,.)?: Unable Help needed moving to and from a bed to chair (including a wheelchair)?: A Little Help needed walking in hospital room?: A Little Help needed climbing 3-5 steps with a railing? : A Little 6 Click Score: 16    End of Session Equipment Utilized During Treatment: Gait belt Activity Tolerance: Patient tolerated treatment well Patient left: in chair;with call bell/phone within reach;with chair alarm set;with nursing/sitter in room;with SCD's reapplied   PT Visit Diagnosis: Unsteadiness on feet (R26.81);Other abnormalities of gait and mobility (R26.89);Muscle weakness (generalized) (M62.81);Difficulty in walking, not elsewhere classified (R26.2)     Time: [id number] PT Time Calculation (min) (ACUTE ONLY): 25 min  Charges:                       G Codes:       Shayleen Eppinger Elnora Morrisonhaffin, SPT    Jesicca Dipierro 02/22/2018, 3:29 PM

## 2018-02-22 NOTE — Progress Notes (Signed)
[facility] - [place] at [facility]   PATIENT NAME: Haley NewnessJean Harville    MR#:  [id number]  DATE OF BIRTH:  1941-01-12  SUBJECTIVE:  CHIEF COMPLAINT: Patient is resting comfortably.  Wife at bedside.  Denies any abdominal pain nausea vomiting, uncomfortable going home without final urine culture results.  REVIEW OF SYSTEMS:  CONSTITUTIONAL: No fever, fatigue or weakness.  EYES: No blurred or double vision.  EARS, NOSE, AND THROAT: No tinnitus or ear pain.  RESPIRATORY: No cough, shortness of breath, wheezing or hemoptysis.  CARDIOVASCULAR: No chest pain, orthopnea, edema.  GASTROINTESTINAL: No nausea, vomiting, diarrhea or abdominal pain.  GENITOURINARY: No dysuria, hematuria.  ENDOCRINE: No polyuria, nocturia,  HEMATOLOGY: No anemia, easy bruising or bleeding SKIN: No rash or lesion. MUSCULOSKELETAL: No joint pain or arthritis.   NEUROLOGIC: No tingling, numbness, weakness.  PSYCHIATRY: No anxiety or depression.   DRUG ALLERGIES:   Allergies  Allergen Reactions  . Ciprofloxacin Itching and Rash  . Fosamax [Alendronate Sodium] Other (See Comments)    Reaction: Ulcers in mouth and esophagus  . Iron Other (See Comments)    VITALS:  Blood pressure (!) 125/54, pulse (!) 57, temperature 98.2 F (36.8 C), temperature source Oral, resp. rate 20, height 5\' 7"  (1.702 m), weight 97.9 kg (215 lb 13.3 oz), SpO2 95 %.  PHYSICAL EXAMINATION:  GENERAL:  77 y.o.-year-old patient lying in the bed with no acute distress.  EYES: Pupils equal, round, reactive to light and accommodation. No scleral icterus. Extraocular muscles intact.  HEENT: Head atraumatic, normocephalic. Oropharynx and nasopharynx clear.  NECK:  Supple, no jugular venous distention. No thyroid enlargement, no tenderness.  LUNGS: Normal breath sounds bilaterally, no wheezing, rales,rhonchi or crepitation. No use of accessory muscles of respiration.  CARDIOVASCULAR: S1, S2 normal. No murmurs, rubs, or  gallops.  ABDOMEN: Soft, nontender, nondistended. Bowel sounds present. No organomegaly or mass.  EXTREMITIES: No pedal edema, cyanosis, or clubbing.  NEUROLOGIC: Cranial nerves II through XII are intact. Muscle strength 5/5 in all extremities. Sensation intact. Gait not checked.  PSYCHIATRIC: The patient is alert and oriented x 3.  SKIN: No obvious rash, lesion, or ulcer.    LABORATORY PANEL:   CBC Recent Labs  Lab 02/22/18 0545  WBC 5.4  HGB 11.9*  HCT 34.9*  PLT 53*   ------------------------------------------------------------------------------------------------------------------  Chemistries  Recent Labs  Lab 02/20/18 1814  02/22/18 0545  NA 138   < > 136  K 3.8   < > 4.2  CL 98   < > 100  CO2 31   < > 29  GLUCOSE 322*   < > 195*  BUN 39*   < > 29*  CREATININE 2.35*   < > 1.71*  CALCIUM 10.0   < > 8.7*  AST 48*  --   --   ALT 26  --   --   ALKPHOS 100  --   --   BILITOT 1.1  --   --    < > = values in this interval not displayed.   ------------------------------------------------------------------------------------------------------------------  Cardiac Enzymes Recent Labs  Lab 02/20/18 1814  TROPONINI 0.03*   ------------------------------------------------------------------------------------------------------------------  RADIOLOGY:  Ct Head Wo Contrast  Result Date: 02/20/2018 CLINICAL DATA:  Increasing weakness EXAM: CT HEAD WITHOUT CONTRAST TECHNIQUE: Contiguous axial images were obtained from the base of the skull through the vertex without intravenous contrast. COMPARISON:  02/25/2017 FINDINGS: Brain: Chronic atrophic and ischemic changes are noted. No findings to suggest acute hemorrhage, acute infarction or  space-occupying mass lesion are noted. Vascular: No hyperdense vessel or unexpected calcification. Skull: Normal. Negative for fracture or focal lesion. Sinuses/Orbits: No acute finding. Other: None. IMPRESSION: Chronic atrophic and ischemic  changes without acute abnormality. Electronically Signed   By: Alcide Clever M.D.   On: 02/20/2018 19:15   Dg Chest Port 1 View  Result Date: 02/20/2018 CLINICAL DATA:  Weakness and fever EXAM: PORTABLE CHEST 1 VIEW COMPARISON:  02/25/2017 FINDINGS: Post sternotomy changes. Mild cardiomegaly. No focal airspace disease or pleural effusion. Aortic atherosclerosis. No pneumothorax. IMPRESSION: No active disease.  Stable cardiomegaly. Electronically Signed   By: Jasmine Pang M.D.   On: 02/20/2018 20:01    EKG:   Orders placed or performed during the hospital encounter of 02/20/18  . ED EKG 12-Lead  . ED EKG 12-Lead  . EKG 12-Lead  . EKG 12-Lead    ASSESSMENT AND PLAN:    77 year old female patient with history of atrial fibrillation on Coumadin, congestive heart failure, chronic kidney disease stage III, coronary artery disease,, hypertension, peripheral neuropathy presented to the emergency room for weakness.  -Urinary tract infection  patient on IV Rocephin antibiotic Follow-up cultures revealing greater than 100,000 colonies of bacteria Gentle hydration with IV fluids  -Chronic atrial fibrillation Continue Coumadin for anticoagulation and INR is therapeutic at 2.26 Coumadin management by pharmacy  -Chronic congestive heart failure Stable.  No fluid overload noticed  -acute on Chronic kidney disease stage III  AKI resolved Monitor renal function, patient creatinine at 1.75 and 2018 , back to baseline creatinine today is 1.71  -DVT prophylaxis On anticoagulation with warfarin.  INR therapeutic at 2.15 Follow-up INR       All the records are reviewed and case discussed with Care Management/Social Workerr. Management plans discussed with the patient, family and they are in agreement.  CODE STATUS: fc  TOTAL TIME TAKING CARE OF THIS PATIENT: 36 minutes.   POSSIBLE D/C IN 1DAYS, DEPENDING ON CLINICAL CONDITION.  Note: This dictation was prepared with Dragon  dictation along with smaller phrase technology. Any transcriptional errors that result from this process are unintentional.   Ramonita Lab M.D on 02/22/2018 at 5:13 PM  Between 7am to 6pm - Pager - [phone] After 6pm go to [email or web address] - password [email or web address] [facility]  [place] [facility]  Office  [phone]  CC: Primary care physician; Barbette Reichmann, MD

## 2018-02-22 NOTE — Progress Notes (Signed)
ANTICOAGULATION CONSULT NOTE  Pharmacy Consult for warfarin Indication: atrial fibrillation  Allergies  Allergen Reactions  . Ciprofloxacin Itching and Rash  . Fosamax [Alendronate Sodium] Other (See Comments)    Reaction: Ulcers in mouth and esophagus  . Iron Other (See Comments)    Patient Measurements: Height: 5\' 7"  (170.2 cm) Weight: 215 lb 13.3 oz (97.9 kg) IBW/kg (Calculated) : 61.6   Vital Signs: Temp: 97.7 F (36.5 C) (07/02 0524) Temp Source: Oral (07/02 0524) BP: 122/78 (07/02 0524) Pulse Rate: 52 (07/02 0524)  Labs: Recent Labs    02/20/18 1814 02/21/18 0427 02/22/18 0545  HGB 13.1 11.6* 11.9*  HCT 39.4 34.3* 34.9*  PLT 77* 59* 53*  LABPROT 23.3* 24.8* 23.8*  INR 2.09 2.26 2.15  CREATININE 2.35* 1.95* 1.71*  TROPONINI 0.03*  --   --     Estimated Creatinine Clearance: 32 mL/min (A) (by C-G formula based on SCr of 1.71 mg/dL (H)).   Medical History: Past Medical History:  Diagnosis Date  . Atrial fibrillation (HCC)   . Atrial flutter (HCC)   . CHF (congestive heart failure) (HCC)   . CKD (chronic kidney disease) stage 3, GFR 30-59 ml/min (HCC)   . Coronary disease   . Dehydration   . Diabetes (HCC)   . Fracture of humeral shaft, right, closed   . Gout   . Hypertension   . Hypothyroidism   . Peripheral neuropathy   . Renal insufficiency   . Right renal artery stenosis (HCC)   . Syncope and collapse   . Vitiligo      Assessment: 77 yo female continuing on warfarin. Confirmed home regimen with daughter-in-law who states pt takes 2 mg Mon thru Fri and 1 mg Sat/Sun. She states pt's last dose was Saturday night.   7/1 INR 2.26   2 mg 7/2 INR 2.15  Goal of Therapy:  INR 2-3 Monitor platelets by anticoagulation protocol: Yes   Plan:  INR therapeutic. Will continue home regimen. Patient on antibiotics. hgb 11.9, plt 53 F/u INR in AM.   Takumi Din A 02/22/2018,7:58 AM

## 2018-02-22 NOTE — Progress Notes (Signed)
Inpatient Diabetes Program Recommendations  AACE/ADA: New Consensus Statement on Inpatient Glycemic Control (2015)  Target Ranges:  Prepandial:   less than 140 mg/dL      Peak postprandial:   less than 180 mg/dL (1-2 hours)      Critically ill patients:  140 - 180 mg/dL   Lab Results  Component Value Date   GLUCAP 202 (H) 02/22/2018   HGBA1C 9.3 (H) 02/22/2018    Review of Glycemic ControlResults for [name], Haley S (MRN [id number]) as of 02/22/2018 13:04  Ref. Range 02/20/2018 21:25 02/21/2018 16:56 02/21/2018 22:40 02/22/2018 07:43 02/22/2018 11:35  Glucose-Capillary Latest Ref Range: 70 - 99 mg/dL 782200 (H) 956337 (H) 213199 (H) 200 (H) 202 (H)   Diabetes history: Type 2 DM  Outpatient Diabetes medications: Humulin 70/30 15 units bid, Glipizide 10 mg bid Current orders for Inpatient glycemic control:  Glipizide 10 mg bid, Lantus 12 units daily,  Novolog 3 units tid with meals, Novolog sensitive tid with meals and HS Inpatient Diabetes Program Recommendations:    Please consider increasing Lantus to 16 units q HS.   Thanks,  Beryl MeagerJenny Leotha Westermeyer, RN, BC-ADM Inpatient Diabetes Coordinator Pager [phone] (8a-5p)

## 2018-02-23 LAB — CBC
HCT: 36.7 % (ref 35.0–47.0)
Hemoglobin: 12.7 g/dL (ref 12.0–16.0)
MCH: 36.6 pg — AB (ref 26.0–34.0)
MCHC: 34.6 g/dL (ref 32.0–36.0)
MCV: 105.9 fL — ABNORMAL HIGH (ref 80.0–100.0)
PLATELETS: 56 10*3/uL — AB (ref 150–440)
RBC: 3.46 MIL/uL — AB (ref 3.80–5.20)
RDW: 14.7 % — ABNORMAL HIGH (ref 11.5–14.5)
WBC: 5.2 10*3/uL (ref 3.6–11.0)

## 2018-02-23 LAB — PROTIME-INR
INR: 2.01
PROTHROMBIN TIME: 22.6 s — AB (ref 11.4–15.2)

## 2018-02-23 LAB — URINE CULTURE

## 2018-02-23 LAB — GLUCOSE, CAPILLARY: GLUCOSE-CAPILLARY: 115 mg/dL — AB (ref 70–99)

## 2018-02-23 MED ORDER — CEPHALEXIN 500 MG PO CAPS: 500.0000 mg | ORAL_CAPSULE | Freq: Two times a day (BID) | ORAL | 0 refills | Status: DC

## 2018-02-23 MED ORDER — CEPHALEXIN 500 MG PO CAPS
500.0000 mg | ORAL_CAPSULE | Freq: Two times a day (BID) | ORAL | Status: DC
  Administered 2018-02-23: 09:00:00 500 mg via ORAL
  Filled 2018-02-23: qty 1

## 2018-02-23 MED ORDER — INSULIN ASPART 100 UNIT/ML ~~LOC~~ SOLN: 5.0000 [IU] | Freq: Three times a day (TID) | SUBCUTANEOUS | Status: DC

## 2018-02-23 MED ORDER — FUROSEMIDE 20 MG PO TABS: 20.0000 mg | ORAL_TABLET | Freq: Two times a day (BID) | ORAL | 0 refills | Status: DC

## 2018-02-23 MED ORDER — INSULIN GLARGINE 100 UNIT/ML ~~LOC~~ SOLN
15.0000 [IU] | Freq: Every day | SUBCUTANEOUS | Status: DC
  Filled 2018-02-23: qty 0.15

## 2018-02-23 NOTE — Clinical Social Work Note (Signed)
Patient is medically ready for discharge today. CSW informed patient of discharge and spoke with patient's daughter in law Tommie ArdKelly Caslin [phone] and notified of [facility] today. CSW also notified Ladona Ridgelaylor at [facility] of discharge today and send discharge info through [employer]. Ladona Ridgelaylor states that [facility] and patient can [facility] today. Patient will be transported be EMS. RN to call report and call for transport.   Ruthe Mannanandace Li Fragoso MSW, [address]CS[address] [phone]

## 2018-02-23 NOTE — Discharge Summary (Signed)
[facility] - [place] at [facility]   PATIENT NAME: Haley Hill    MR#:  [id number]  DATE OF BIRTH:  1941-07-27  DATE OF ADMISSION:  02/20/2018 ADMITTING PHYSICIAN: Ihor Austin, MD  DATE OF DISCHARGE: July 3rd 2019  PRIMARY CARE PHYSICIAN: Barbette Reichmann, MD    ADMISSION DIAGNOSIS:  Lower urinary tract infectious disease [N39.0] Weakness [R53.1] Fever [R50.9]  DISCHARGE DIAGNOSIS:  E coli UTI  SECONDARY DIAGNOSIS:   Past Medical History:  Diagnosis Date  . Atrial fibrillation (HCC)   . Atrial flutter (HCC)   . CHF (congestive heart failure) (HCC)   . CKD (chronic kidney disease) stage 3, GFR 30-59 ml/min (HCC)   . Coronary disease   . Dehydration   . Diabetes (HCC)   . Fracture of humeral shaft, right, closed   . Gout   . Hypertension   . Hypothyroidism   . Peripheral neuropathy   . Renal insufficiency   . Right renal artery stenosis (HCC)   . Syncope and collapse   . Vitiligo     HOSPITAL COURSE:   77 year old female patient with history of atrial fibrillation on Coumadin, congestive heart failure, chronic kidney disease stage III, coronary artery disease,, hypertension, peripheral neuropathy presented to the emergency room for weakness.  -Urinary tract infection  patient on IV Rocephin antibiotic--change to po keflex Follow-up cultures revealing greater than 100,000 colonies of Ecoli--pansensitive Received Gentle hydration with IV fluids  -Chronic atrial fibrillation Continue Coumadin for anticoagulation and INR is therapeutic at 2.26 Coumadin management by pharmacy  -Chronic congestive heart failure Stable.  No fluid overload noticed -decreased lasix to 20 mg bid (elevated creat)  -acute on Chronic kidney disease stage III  AKI resolved Monitor renal function, patient creatinine at 1.75 and 2018 , back to baseline creatinine today is 1.71  -DVT prophylaxis On anticoagulation with warfarin.  INR therapeutic at  2.15 Follow-up INR  overall improved D/w dter in law over the phone. D/c to [facility]   CONSULTS OBTAINED:    DRUG ALLERGIES:   Allergies  Allergen Reactions  . Ciprofloxacin Itching and Rash  . Fosamax [Alendronate Sodium] Other (See Comments)    Reaction: Ulcers in mouth and esophagus  . Iron Other (See Comments)    DISCHARGE MEDICATIONS:   Allergies as of 02/23/2018      Reactions   Ciprofloxacin Itching, Rash   Fosamax [alendronate Sodium] Other (See Comments)   Reaction: Ulcers in mouth and esophagus   Iron Other (See Comments)      Medication List    STOP taking these medications   docusate sodium 100 MG capsule Commonly known as:  COLACE   HYDROcodone-acetaminophen 5-325 MG tablet Commonly known as:  NORCO/VICODIN   mupirocin ointment 2 % Commonly known as:  BACTROBAN     TAKE these medications   allopurinol 300 MG tablet Commonly known as:  ZYLOPRIM Take 300 mg by mouth daily.   calcitRIOL 0.5 MCG capsule Commonly known as:  ROCALTROL Take 1 capsule by mouth daily.   calcitRIOL 0.25 MCG capsule Commonly known as:  ROCALTROL Take 1 capsule by mouth 3 (three) times a week. Monday Wednesday Friday   cephALEXin 500 MG capsule Commonly known as:  KEFLEX Take 1 capsule (500 mg total) by mouth every 12 (twelve) hours.   ferrous sulfate 325 (65 FE) MG tablet Take 325 mg by mouth daily with breakfast.   furosemide 20 MG tablet Commonly known as:  LASIX Take 1 tablet (20 mg total) by  mouth 2 (two) times daily. What changed:    medication strength  how much to take   gabapentin 800 MG tablet Commonly known as:  NEURONTIN Take 800 mg by mouth every 8 (eight) hours.   glipiZIDE 10 MG tablet Commonly known as:  GLUCOTROL Take 10 mg by mouth 2 (two) times daily before a meal.   HUMULIN 70/30 KWIKPEN (70-30) 100 UNIT/ML PEN Generic drug:  Insulin Isophane & Regular Human Inject 15 Units into the skin 2 (two) times daily.   isosorbide  mononitrate 20 MG tablet Commonly known as:  ISMO,MONOKET Take 20 mg by mouth daily.   levothyroxine 112 MCG tablet Commonly known as:  SYNTHROID, LEVOTHROID Take 112 mcg by mouth daily before breakfast.   metoprolol succinate 25 MG 24 hr tablet Commonly known as:  TOPROL-XL Take 25 mg by mouth daily.   omeprazole 20 MG capsule Commonly known as:  PRILOSEC Take 20 mg by mouth 2 (two) times daily.   ONE TOUCH ULTRA TEST test strip Generic drug:  glucose blood   potassium chloride SA 20 MEQ tablet Commonly known as:  K-DUR,KLOR-CON Take 20 mEq by mouth daily.   rOPINIRole 0.5 MG tablet Commonly known as:  REQUIP Take 0.5 mg by mouth at bedtime.   senna-docusate 8.6-50 MG tablet Commonly known as:  Senokot-S Take 1 tablet by mouth at bedtime as needed for mild constipation.   simvastatin 40 MG tablet Commonly known as:  ZOCOR Take 40 mg by mouth at bedtime.   vitamin B-12 500 MCG tablet Commonly known as:  CYANOCOBALAMIN Take 500 mcg by mouth daily.   Vitamin D 2000 units Caps Take 2,000 Units by mouth daily.   warfarin 1 MG tablet Commonly known as:  COUMADIN Take 1 tablet (1 mg total) by mouth daily at 6 PM.       If you experience worsening of your admission symptoms, develop shortness of breath, life threatening emergency, suicidal or homicidal thoughts you must seek medical attention immediately by calling 911 or calling your MD immediately  if symptoms less severe.  You Must read complete instructions/literature along with all the possible adverse reactions/side effects for all the Medicines you take and that have been prescribed to you. Take any new Medicines after you have completely understood and accept all the possible adverse reactions/side effects.   Please note  You were cared for by a hospitalist during your hospital stay. If you have any questions about your discharge medications or the care you received while you were in the hospital after you are  discharged, you can call the unit and asked to speak with the hospitalist on call if the hospitalist that took care of you is not available. Once you are discharged, your primary care physician will handle any further medical issues. Please note that NO REFILLS for any discharge medications will be authorized once you are discharged, as it is imperative that you return to your primary care physician (or establish a relationship with a primary care physician if you do not have one) for your aftercare needs so that they can reassess your need for medications and monitor your lab values. Today   SUBJECTIVE    Doing well VITAL SIGNS:  Blood pressure 139/66, pulse (!) 109, temperature 98 F (36.7 C), temperature source Oral, resp. rate 18, height 5\' 7"  (1.702 m), weight 97.9 kg (215 lb 13.3 oz), SpO2 96 %.  I/O:    Intake/Output Summary (Last 24 hours) at 02/23/2018 0750 Last data filed  at 02/23/2018 0209 Gross per 24 hour  Intake 600 ml  Output 1320 ml  Net -720 ml    PHYSICAL EXAMINATION:  GENERAL:  77 y.o.-year-old patient lying in the bed with no acute distress.  EYES: Pupils equal, round, reactive to light and accommodation. No scleral icterus. Extraocular muscles intact.  HEENT: Head atraumatic, normocephalic. Oropharynx and nasopharynx clear.  NECK:  Supple, no jugular venous distention. No thyroid enlargement, no tenderness.  LUNGS: Normal breath sounds bilaterally, no wheezing, rales,rhonchi or crepitation. No use of accessory muscles of respiration.  CARDIOVASCULAR: S1, S2 normal. No murmurs, rubs, or gallops.  ABDOMEN: Soft, non-tender, non-distended. Bowel sounds present. No organomegaly or mass.  EXTREMITIES: No pedal edema, cyanosis, or clubbing.  NEUROLOGIC: Cranial nerves II through XII are intact. Muscle strength 5/5 in all extremities. Sensation intact. Gait not checked.  PSYCHIATRIC: The patient is alert and oriented x 3.  SKIN: No obvious rash, lesion, or ulcer.   DATA  REVIEW:   CBC  Recent Labs  Lab 02/23/18 0535  WBC 5.2  HGB 12.7  HCT 36.7  PLT 56*    Chemistries  Recent Labs  Lab 02/20/18 1814  02/22/18 0545  NA 138   < > 136  K 3.8   < > 4.2  CL 98   < > 100  CO2 31   < > 29  GLUCOSE 322*   < > 195*  BUN 39*   < > 29*  CREATININE 2.35*   < > 1.71*  CALCIUM 10.0   < > 8.7*  AST 48*  --   --   ALT 26  --   --   ALKPHOS 100  --   --   BILITOT 1.1  --   --    < > = values in this interval not displayed.    Microbiology Results   Recent Results (from the past 240 hour(s))  Blood Culture (routine x 2)     Status: None (Preliminary result)   Collection Time: 02/20/18  6:14 PM  Result Value Ref Range Status   Specimen Description BLOOD RT HAND  Final   Special Requests   Final    BOTTLES DRAWN AEROBIC AND ANAEROBIC Blood Culture adequate volume   Culture   Final    NO GROWTH 3 DAYS Performed at [facility], [address] [place], [place] [address]    Report Status PENDING  Incomplete  Blood Culture (routine x 2)     Status: None (Preliminary result)   Collection Time: 02/20/18  6:14 PM  Result Value Ref Range Status   Specimen Description BLOOD RT [facility]  Final   Special Requests   Final    BOTTLES DRAWN AEROBIC AND ANAEROBIC Blood Culture adequate volume   Culture   Final    NO GROWTH 3 DAYS Performed at [facility], [address] [place], [place] [address]    Report Status PENDING  Incomplete  Urine culture     Status: Abnormal (Preliminary result)   Collection Time: 02/20/18  6:14 PM  Result Value Ref Range Status   Specimen Description   Final    URINE, RANDOM Performed at [facility], [address] [place], [place] [address]    Special Requests   Final    NONE Performed at [facility], [address] [place], [place] [address]    Culture >=100,000 COLONIES/mL ESCHERICHIA COLI (A)  Final   Report Status PENDING  Incomplete  RADIOLOGY:  No results  found.   Management plans discussed with the patient, family and they are in agreement.  CODE STATUS:     Code Status Orders  (From admission, onward)        Start     Ordered   02/20/18 2123  Full code  Continuous     02/20/18 2122    Code Status History    Date Active Date Inactive Code Status Order ID Comments User Context   04/29/2015 1726 05/02/2015 2124 Full Code [id number]  Adrian Saran, MD Inpatient   01/25/2015 1633 01/28/2015 1855 Full Code [id number]  Milagros Loll, MD ED      TOTAL TIME TAKING CARE OF THIS PATIENT: *40 minutes.    Enedina Finner M.D on 02/23/2018 at 7:50 AM  Between 7am to 6pm - Pager - [phone] After 6pm go to [email or web address] - Social research officer, government  [facility] [place] [facility]  Office  [phone]  CC: Primary care physician; Barbette Reichmann, MD

## 2018-02-23 NOTE — Progress Notes (Signed)
[facility] per [facility] order. Report called to Tabitha at facility. EMS called for transportation.

## 2018-02-25 ENCOUNTER — Other Ambulatory Visit
Admission: RE | Admit: 2018-02-25 | Discharge: 2018-02-25 | Disposition: A | Discharge status: Home / Self Care | Payer: Medicare Other | Source: Other Acute Inpatient Hospital | Attending: Internal Medicine | Admitting: Internal Medicine

## 2018-02-25 DIAGNOSIS — I4891 Unspecified atrial fibrillation: Secondary | ICD-10-CM | POA: Diagnosis present

## 2018-02-25 LAB — CULTURE, BLOOD (ROUTINE X 2)
Culture: NO GROWTH
Culture: NO GROWTH
SPECIAL REQUESTS: ADEQUATE
SPECIAL REQUESTS: ADEQUATE

## 2018-02-25 LAB — PROTIME-INR
INR: 1.59
Prothrombin Time: 18.8 seconds — ABNORMAL HIGH (ref 11.4–15.2)

## 2018-03-08 ENCOUNTER — Encounter: Payer: Self-pay | Admitting: Adult Health

## 2018-03-08 ENCOUNTER — Other Ambulatory Visit: Payer: Self-pay | Admitting: Adult Health

## 2018-03-08 ENCOUNTER — Non-Acute Institutional Stay (SKILLED_NURSING_FACILITY): Payer: Medicare Other | Admitting: Adult Health

## 2018-03-08 DIAGNOSIS — I4819 Other persistent atrial fibrillation: Secondary | ICD-10-CM

## 2018-03-08 DIAGNOSIS — F0390 Unspecified dementia without behavioral disturbance: Secondary | ICD-10-CM | POA: Diagnosis not present

## 2018-03-08 DIAGNOSIS — I509 Heart failure, unspecified: Secondary | ICD-10-CM

## 2018-03-08 DIAGNOSIS — I481 Persistent atrial fibrillation: Secondary | ICD-10-CM | POA: Diagnosis not present

## 2018-03-08 DIAGNOSIS — Z794 Long term (current) use of insulin: Secondary | ICD-10-CM

## 2018-03-08 DIAGNOSIS — E039 Hypothyroidism, unspecified: Secondary | ICD-10-CM | POA: Diagnosis not present

## 2018-03-08 DIAGNOSIS — K219 Gastro-esophageal reflux disease without esophagitis: Secondary | ICD-10-CM

## 2018-03-08 DIAGNOSIS — N183 Chronic kidney disease, stage 3 unspecified: Secondary | ICD-10-CM

## 2018-03-08 DIAGNOSIS — R531 Weakness: Secondary | ICD-10-CM | POA: Diagnosis not present

## 2018-03-08 DIAGNOSIS — E1122 Type 2 diabetes mellitus with diabetic chronic kidney disease: Secondary | ICD-10-CM

## 2018-03-08 DIAGNOSIS — N390 Urinary tract infection, site not specified: Secondary | ICD-10-CM

## 2018-03-08 DIAGNOSIS — G629 Polyneuropathy, unspecified: Secondary | ICD-10-CM

## 2018-03-08 DIAGNOSIS — M109 Gout, unspecified: Secondary | ICD-10-CM

## 2018-03-08 DIAGNOSIS — I2510 Atherosclerotic heart disease of native coronary artery without angina pectoris: Secondary | ICD-10-CM

## 2018-03-08 MED ORDER — METOPROLOL SUCCINATE ER 25 MG PO TB24: 25.0000 mg | ORAL_TABLET | Freq: Every day | ORAL | 0 refills | Status: DC

## 2018-03-08 MED ORDER — WARFARIN SODIUM 1 MG PO TABS: 1.0000 mg | ORAL_TABLET | Freq: Every day | ORAL | 0 refills | Status: DC

## 2018-03-08 MED ORDER — POTASSIUM CHLORIDE CRYS ER 20 MEQ PO TBCR: 20.0000 meq | EXTENDED_RELEASE_TABLET | Freq: Every day | ORAL | 0 refills | Status: DC

## 2018-03-08 MED ORDER — PREDNISONE 20 MG PO TABS: 20.0000 mg | ORAL_TABLET | Freq: Every day | ORAL | 0 refills | Status: DC

## 2018-03-08 MED ORDER — GABAPENTIN 800 MG PO TABS: 800.0000 mg | ORAL_TABLET | Freq: Three times a day (TID) | ORAL | 0 refills | Status: DC

## 2018-03-08 MED ORDER — ONETOUCH ULTRA BLUE VI STRP: 1.0000 | ORAL_STRIP | Freq: Four times a day (QID) | 0 refills | Status: DC

## 2018-03-08 MED ORDER — OMEPRAZOLE 20 MG PO CPDR: 20.0000 mg | DELAYED_RELEASE_CAPSULE | Freq: Two times a day (BID) | ORAL | 0 refills | Status: DC

## 2018-03-08 MED ORDER — FUROSEMIDE 20 MG PO TABS: 20.0000 mg | ORAL_TABLET | Freq: Two times a day (BID) | ORAL | 0 refills | Status: DC

## 2018-03-08 MED ORDER — CALCITRIOL 0.25 MCG PO CAPS: 0.2500 ug | ORAL_CAPSULE | ORAL | 0 refills | Status: DC

## 2018-03-08 MED ORDER — ISOSORBIDE MONONITRATE 20 MG PO TABS
20.0000 mg | ORAL_TABLET | Freq: Every day | ORAL | 0 refills | Status: DC
Start: 2018-03-08 — End: 2018-05-03

## 2018-03-08 MED ORDER — HUMULIN 70/30 KWIKPEN (70-30) 100 UNIT/ML ~~LOC~~ SUPN: 15.0000 [IU] | PEN_INJECTOR | Freq: Two times a day (BID) | SUBCUTANEOUS | 0 refills | Status: DC

## 2018-03-08 MED ORDER — LEVOTHYROXINE SODIUM 112 MCG PO TABS: 112.0000 ug | ORAL_TABLET | Freq: Every day | ORAL | 0 refills | Status: DC

## 2018-03-08 MED ORDER — CALCITRIOL 0.5 MCG PO CAPS: 0.5000 ug | ORAL_CAPSULE | Freq: Every day | ORAL | 0 refills | Status: DC

## 2018-03-08 MED ORDER — DICLOFENAC SODIUM 1 % TD GEL: 2.0000 g | Freq: Four times a day (QID) | TRANSDERMAL | 0 refills | Status: DC

## 2018-03-08 MED ORDER — GLIPIZIDE 10 MG PO TABS: 10.0000 mg | ORAL_TABLET | Freq: Two times a day (BID) | ORAL | 0 refills | Status: DC

## 2018-03-08 MED ORDER — ALLOPURINOL 300 MG PO TABS: 300.0000 mg | ORAL_TABLET | Freq: Every day | ORAL | 0 refills | Status: DC

## 2018-03-08 MED ORDER — SIMVASTATIN 40 MG PO TABS: 40.0000 mg | ORAL_TABLET | Freq: Every day | ORAL | 0 refills | Status: DC

## 2018-03-08 NOTE — Progress Notes (Addendum)
[facility] Room Number: 210A Place of Service:  SNF (31) Provider:  Kenard Gower, NP  Patient Care Team: Barbette Reichmann, MD as PCP - General (Internal Medicine)  Extended Emergency Contact Information Primary Emergency Contact: Jeanella Craze [place] of [place] Phone: [phone] Relation: Other Secondary Emergency Contact: Nevares,Jerry"Ray" Address: [address]          [place], [place] [address] [place] of [place] Home Phone: [phone] Work Phone: [phone] Relation: Son  Code Status:  FULL  Goals of care: Advanced Directive information Advanced Directives 03/08/2018  Does Patient Have a Medical Advance Directive? No  Type of Advance Directive -  Does patient want to make changes to medical advance directive? -  Copy of Healthcare Power of Attorney in Chart? -  Would patient like information on creating a medical advance directive? No - Patient declined     Chief Complaint  Patient presents with  . Discharge Note    Discharging on 03/10/2018    HPI:  Pt is a 77 y.o. Hill seen today for a discharge visit. She will discharge home with Home health PT, OT and Nursing.  [facility] from a recent hospitalization for UTI for which she was given IV Rocephin then changed to Keflex. She has completed antibiotic course. She has PMH of Afib, CHF, CKD 3, CAD and hypertension.   Speech therapist administered SLUMS examination and she has scored 12/20, diagnostic of dementia. She was seen in the room today with son at bedside.  Patient was admitted to this facility for short-term rehabilitation after the patient's recent hospitalization.  Patient has completed SNF rehabilitation and therapy has cleared the patient for discharge.   Past Medical History:  Diagnosis Date  . Atrial fibrillation (HCC)   . Atrial flutter (HCC)   . CHF (congestive heart failure) (HCC)    . CKD (chronic kidney disease) stage 3, GFR 30-59 ml/min (HCC)   . Coronary disease   . Dehydration   . Diabetes (HCC)   . Fracture of humeral shaft, right, closed   . Gout   . Hypertension   . Hypothyroidism   . Peripheral neuropathy   . Renal insufficiency   . Right renal artery stenosis (HCC)   . Syncope and collapse   . Vitiligo    Past Surgical History:  Procedure Laterality Date  . BREAST BIOPSY Right 10/2000   negative for cancer  . BREAST EXCISIONAL BIOPSY Bilateral 1970's   neg  . CARDIAC CATHETERIZATION    . CORONARY ARTERY BYPASS GRAFT    . ESOPHAGOGASTRODUODENOSCOPY (EGD) WITH PROPOFOL N/A 06/10/2017   Procedure: ESOPHAGOGASTRODUODENOSCOPY (EGD) WITH PROPOFOL;  Surgeon: Christena Deem, MD;  Location: [facility] ENDOSCOPY;  Service: Endoscopy;  Laterality: N/A;  . FRACTURE SURGERY      Allergies  Allergen Reactions  . Ciprofloxacin Itching and Rash  . Fosamax [Alendronate Sodium] Other (See Comments)    Reaction: Ulcers in mouth and esophagus  . Iron Other (See Comments)    Outpatient Encounter Medications as of 03/08/2018  Medication Sig  . acetaminophen (TYLENOL) 325 MG tablet Take 650 mg by mouth every 4 (four) hours as needed. for pain/ increased temp. May be administered orally, per G-tube if needed or rectally if unable to swallow (separate order). Maximum dose for 24 hours is 3,000 mg from all sources of Acetaminophen/ Tylenol  . allopurinol (ZYLOPRIM) 300 MG tablet Take 300 mg by mouth daily.   Marland Kitchen  calcitRIOL (ROCALTROL) 0.25 MCG capsule Take 1 capsule by mouth 3 (three) times a week. Monday Wednesday Friday  . calcitRIOL (ROCALTROL) 0.5 MCG capsule Take 1 capsule by mouth daily.   . Cholecalciferol (VITAMIN D) 2000 UNITS CAPS Take 2,000 Units by mouth daily.  . cyanocobalamin 500 MCG tablet Take 500 mcg by mouth daily.  . diclofenac sodium (VOLTAREN) 1 % GEL Apply 2 g topically 4 (four) times daily. Apply to left foot  . furosemide (LASIX) 20 MG tablet Take  1 tablet (20 mg total) by mouth 2 (two) times daily.  Marland Kitchen gabapentin (NEURONTIN) 800 MG tablet Take 800 mg by mouth every 8 (eight) hours.   Marland Kitchen glipiZIDE (GLUCOTROL) 10 MG tablet Take 10 mg by mouth 2 (two) times daily before a meal.   . HUMULIN 70/30 KWIKPEN (70-30) 100 UNIT/ML PEN Inject 15 Units into the skin 2 (two) times daily.   . isosorbide mononitrate (ISMO,MONOKET) 20 MG tablet Take 20 mg by mouth daily.   Marland Kitchen levothyroxine (SYNTHROID, LEVOTHROID) 112 MCG tablet Take 112 mcg by mouth daily before breakfast.   . metoprolol succinate (TOPROL-XL) 25 MG 24 hr tablet Take 25 mg by mouth daily.   Marland Kitchen omeprazole (PRILOSEC) 20 MG capsule Take 20 mg by mouth 2 (two) times daily.  . ONE TOUCH ULTRA TEST test strip   . potassium chloride SA (K-DUR,KLOR-CON) 20 MEQ tablet Take 20 mEq by mouth daily.  . predniSONE (DELTASONE) 20 MG tablet Take 20 mg by mouth daily with breakfast.  . senna-docusate (SENOKOT-S) 8.6-50 MG per tablet Take 1 tablet by mouth at bedtime as needed for mild constipation.  . simvastatin (ZOCOR) 40 MG tablet Take 40 mg by mouth at bedtime.   Marland Kitchen warfarin (COUMADIN) 1 MG tablet Take 1 mg by mouth daily.  . [DISCONTINUED] ferrous sulfate 325 (65 FE) MG tablet Take 325 mg by mouth daily with breakfast.  . [DISCONTINUED] cephALEXin (KEFLEX) 500 MG capsule Take 1 capsule (500 mg total) by mouth every 12 (twelve) hours.  . [DISCONTINUED] rOPINIRole (REQUIP) 0.5 MG tablet Take 0.5 mg by mouth at bedtime.  . [DISCONTINUED] warfarin (COUMADIN) 1 MG tablet Take 1 tablet (1 mg total) by mouth daily at 6 PM.   No facility-administered encounter medications on file as of 03/08/2018.     Review of Systems  GENERAL: No change in appetite, no fatigue, no weight changes, no fever, chills or weakness MOUTH and THROAT: Denies oral discomfort, gingival pain or bleeding, pain from teeth or hoarseness   RESPIRATORY: no cough, SOB, DOE, wheezing, hemoptysis CARDIAC: No chest pain, edema or  palpitations GI: No abdominal pain, diarrhea, constipation, heart burn, nausea or vomiting GU: Denies dysuria, frequency, hematuria, incontinence, or discharge PSYCHIATRIC: Denies feelings of depression or anxiety. No report of hallucinations, insomnia, paranoia, or agitation   Immunization History  Administered Date(s) Administered  . Influenza Inj Mdck Quad Pf 06/19/2016  . Influenza Split 06/17/2015  . Influenza-Unspecified 05/16/2014, 06/01/2017  . Pneumococcal Conjugate-13 07/10/2016  . Pneumococcal Polysaccharide-23 10/17/2015  . Tdap 10/01/2016   Pertinent  Health Maintenance Due  Topic Date Due  . FOOT EXAM  01/20/1951  . OPHTHALMOLOGY EXAM  01/20/1951  . URINE MICROALBUMIN  01/20/1951  . DEXA SCAN  01/21/2004  . PNA vac Low Risk Adult (1 of 2 - PCV13) 01/21/2004  . INFLUENZA VACCINE  03/24/2018  . HEMOGLOBIN A1C  08/25/2018        Vitals:   03/08/18 1520  BP: (!) 116/Haley  Pulse: (!) 52  Resp: 20  Temp: 97.6 F (36.4 C)  TempSrc: Oral  SpO2: 96%  Weight: 210 lb 3.2 oz (95.3 kg)  Height: 5\' 7"  (1.702 m)   Body mass index is 32.92 kg/m.  Physical Exam  GENERAL APPEARANCE: Well nourished. In no acute distress. Obese SKIN:  Skin is warm and dry.  MOUTH and THROAT: Lips are without lesions. Oral mucosa is moist and without lesions. Tongue is normal in shape, size, and color and without lesions RESPIRATORY: Breathing is even & unlabored, BS CTAB CARDIAC: RRR, no murmur,no extra heart sounds, no edema GI: Abdomen soft, normal BS, no masses, no tenderness EXTREMITIES:  Able to move X 4 extremities PSYCHIATRIC:  Affect and behavior are appropriate   Labs reviewed: Recent Labs    02/20/18 1814 02/21/18 0427 02/22/18 0545  NA 138 141 136  K 3.8 3.7 4.2  CL 98 104 100  CO2 31 28 29   GLUCOSE 322* 274* 195*  BUN 39* 35* 29*  CREATININE 2.35* 1.95* 1.71*  CALCIUM 10.0 8.8* 8.7*   Recent Labs    02/20/18 1814  AST 48*  ALT 26  ALKPHOS 100  BILITOT  1.1  PROT 7.0  ALBUMIN 3.8   Recent Labs    06/10/17 0840 02/20/18 1814 02/21/18 0427 02/22/18 0545 02/23/18 0535  WBC 4.1 5.2 4.9 5.4 5.2  NEUTROABS 2.8 3.6  --   --   --   HGB 14.0 13.1 11.6* 11.9* 12.7  HCT 41.9 39.4 34.3* 34.9* 36.7  MCV 105.9* 106.9* 106.4* 106.4* 105.9*  PLT 77* 77* 59* 53* 56*   Lab Results  Component Value Date   TSH 1.16 04/24/2013   Lab Results  Component Value Date   HGBA1C 9.3 (H) 02/22/2018   No results found for: CHOL, HDL, LDLCALC, LDLDIRECT, TRIG, CHOLHDL  Significant Diagnostic Results in last 30 days:  Ct Head Wo Contrast  Result Date: 02/20/2018 CLINICAL DATA:  Increasing weakness EXAM: CT HEAD WITHOUT CONTRAST TECHNIQUE: Contiguous axial images were obtained from the base of the skull through the vertex without intravenous contrast. COMPARISON:  02/25/2017 FINDINGS: Brain: Chronic atrophic and ischemic changes are noted. No findings to suggest acute hemorrhage, acute infarction or space-occupying mass lesion are noted. Vascular: No hyperdense vessel or unexpected calcification. Skull: Normal. Negative for fracture or focal lesion. Sinuses/Orbits: No acute finding. Other: None. IMPRESSION: Chronic atrophic and ischemic changes without acute abnormality. Electronically Signed   By: Alcide Clever M.D.   On: 02/20/2018 19:15   Dg Chest Port 1 View  Result Date: 02/20/2018 CLINICAL DATA:  Weakness and fever EXAM: PORTABLE CHEST 1 VIEW COMPARISON:  02/25/2017 FINDINGS: Post sternotomy changes. Mild cardiomegaly. No focal airspace disease or pleural effusion. Aortic atherosclerosis. No pneumothorax. IMPRESSION: No active disease.  Stable cardiomegaly. Electronically Signed   By: Jasmine Pang M.D.   On: 02/20/2018 20:01    Assessment/Plan  1. Urinary tract infection without hematuria, site unspecified - has completed antibiotics, resolved   2. Generalized weakness - for Home health PT and OT, for therapeutic strengthening exercises, fall  precautions   3. Type 2 diabetes mellitus with stage 3 chronic kidney disease, with long-term current use of insulin (HCC) Lab Results  Component Value Date   HGBA1C 9.3 (H) 02/22/2018    - glipiZIDE (GLUCOTROL) 10 MG tablet; Take 1 tablet (10 mg total) by mouth 2 (two) times daily before a meal.  Dispense: 60 tablet; Refill: 0 - HUMULIN 70/30 KWIKPEN (70-30) 100 UNIT/ML PEN; Inject 15 Units into the  skin 2 (two) times daily.  Dispense: 15 mL; Refill: 0 - ONE TOUCH ULTRA TEST test strip; 1 each by Other route 4 (four) times daily.  Dispense: 100 each; Refill: 0  4. Neuropathy - gabapentin (NEURONTIN) 800 MG tablet; Take 1 tablet (800 mg total) by mouth every 8 (eight) hours.  Dispense: 90 tablet; Refill: 0  5. Congestive heart failure, unspecified HF chronicity, unspecified heart failure type (HCC) - furosemide (LASIX) 20 MG tablet; Take 1 tablet (20 mg total) by mouth 2 (two) times daily.  Dispense: 30 tablet; Refill: 0 - potassium chloride SA (K-DUR,KLOR-CON) 20 MEQ tablet; Take 1 tablet (20 mEq total) by mouth daily.  Dispense: 30 tablet; Refill: 0  6. Gastroesophageal reflux disease, esophagitis presence not specified - omeprazole (PRILOSEC) 20 MG capsule; Take 1 capsule (20 mg total) by mouth 2 (two) times daily.  Dispense: 60 capsule; Refill: 0  7. Adult hypothyroidism Lab Results  Component Value Date   TSH 1.16 04/24/2013   - levothyroxine (SYNTHROID, LEVOTHROID) 112 MCG tablet; Take 1 tablet (112 mcg total) by mouth daily before breakfast.  Dispense: 30 tablet; Refill: 0  8. Arteriosclerosis of coronary artery - - isosorbide mononitrate (ISMO,MONOKET) 20 MG tablet; Take 1 tablet (20 mg total) by mouth daily.  Dispense: 30 tablet; Refill: 0 - predniSONE (DELTASONE) 20 MG tablet; Take 1 tablet (20 mg total) by mouth daily with breakfast for 6 days.  Dispense: 4 tablet; Refill: 0 - simvastatin (ZOCOR) 40 MG tablet; Take 1 tablet (40 mg total) by mouth at bedtime.  Dispense: 30  tablet; Refill: 0  9. Persistent atrial fibrillation (HCC) - rate-controlled - metoprolol succinate (TOPROL-XL) 25 MG 24 hr tablet; Take 1 tablet (25 mg total) by mouth daily.  Dispense: 30 tablet; Refill: 0 - warfarin (COUMADIN) 1 MG tablet; Take 1 tablet (1 mg total) by mouth daily.  Dispense: 30 tablet; Refill: 0  10. Gout, unspecified cause, unspecified chronicity, unspecified site - allopurinol (ZYLOPRIM) 300 MG tablet; Take 1 tablet (300 mg total) by mouth daily.  Dispense: 30 tablet; Refill: 0 - predniSONE (DELTASONE) 20 MG tablet; Take 1 tablet (20 mg total) by mouth daily with breakfast for 6 days.  Dispense: 4 tablet; Refill: 0  11. Dementia without behavioral distrurbance - she has scored 12/30 when ST administered the SLUMS examination, continue supportive care, fall precautions   I have filled out patient's discharge paperwork and written prescriptions.  Patient will receive home health PT, OT, and Nursing.  DME provided: None  Total discharge time: Greater than 30 minutes Greater than 50% was spent in counseling and coordination of care.   Discharge time involved coordination of the discharge process with social worker, nursing staff and therapy department. Medical justification for home health services verified.    Kenard GowerMonina Medina-Vargas, NP [facility] [phone] (Monday-Friday 8:00 a.m. - 5:00 p.m.) [phone] (after hours)

## 2018-03-09 ENCOUNTER — Other Ambulatory Visit: Payer: Self-pay

## 2018-03-09 DIAGNOSIS — I2510 Atherosclerotic heart disease of native coronary artery without angina pectoris: Secondary | ICD-10-CM

## 2018-03-09 DIAGNOSIS — M109 Gout, unspecified: Secondary | ICD-10-CM

## 2018-03-09 MED ORDER — PREDNISONE 20 MG PO TABS: 20.0000 mg | ORAL_TABLET | Freq: Every day | ORAL | 0 refills | Status: AC

## 2018-03-09 NOTE — Telephone Encounter (Signed)
A fax was received from pharmacy requesting clarification on prednisone 20 mg prescription that was sent as [facility]   Directions for prednisone 20 mg are: Take 1 tablet by mouth daily with breakfast for 6 days. However, only 4 tablets were sent to the pharmacy. A corrected prescription has been sent for 6 tablets.

## 2018-03-10 ENCOUNTER — Other Ambulatory Visit
Admission: RE | Admit: 2018-03-10 | Discharge: 2018-03-10 | Disposition: A | Discharge status: Home / Self Care | Payer: Medicare Other | Source: Ambulatory Visit | Attending: Internal Medicine | Admitting: Internal Medicine

## 2018-03-10 DIAGNOSIS — I4891 Unspecified atrial fibrillation: Secondary | ICD-10-CM | POA: Diagnosis present

## 2018-03-10 LAB — PROTIME-INR
INR: 1.19
PROTHROMBIN TIME: 15 s (ref 11.4–15.2)

## 2018-03-17 ENCOUNTER — Ambulatory Visit: Payer: Medicare Other | Admitting: Podiatry

## 2018-03-28 ENCOUNTER — Encounter: Payer: Self-pay | Admitting: Podiatry

## 2018-03-28 ENCOUNTER — Ambulatory Visit: Payer: Medicare Other | Admitting: Podiatry

## 2018-03-28 DIAGNOSIS — M79676 Pain in unspecified toe(s): Secondary | ICD-10-CM

## 2018-03-28 DIAGNOSIS — B351 Tinea unguium: Secondary | ICD-10-CM | POA: Diagnosis not present

## 2018-03-28 DIAGNOSIS — E0852 Diabetes mellitus due to underlying condition with diabetic peripheral angiopathy with gangrene: Secondary | ICD-10-CM

## 2018-03-28 DIAGNOSIS — Z794 Long term (current) use of insulin: Secondary | ICD-10-CM

## 2018-03-28 NOTE — Progress Notes (Signed)
Complaint:  Visit Type: Patient returns to my office for continued preventative foot care services. Complaint: Patient states" my nails have grown long and thick and become painful to walk and wear shoes" Patient has been diagnosed with DM with no foot complications. The patient presents for preventative foot care services. No changes to ROS  Podiatric Exam: Vascular: dorsalis pedis and posterior tibial pulses are absent  bilateral. Capillary return is diminished . Cold feet noted.  Venous stasis feet  B/L.Marland Kitchen. Skin turgor WNL  Sensorium  Semmes Weinstein monofilament test is diminished. Normal tactile sensation bilaterally. Nail Exam: Pt has thick disfigured discolored nails with subungual debris noted bilateral entire nail hallux through fifth toenails Ulcer Exam: There is no evidence of ulcer or pre-ulcerative changes or infection. Orthopedic Exam: Muscle tone and strength are WNL. No limitations in general ROM. No crepitus or effusions noted. Foot type and digits show no abnormalities. HAV  B/L.  Hammer toes  B/L. Skin: No Porokeratosis. No infection or ulcers.  Distal clavi second toe left foot asymptomatic today.  Diagnosis:  Onychomycosis, , Pain in right toe, pain in left toes Diabetes with angiopathy.   Treatment & Plan Procedures and Treatment: Consent by patient was obtained for treatment procedures. The patient understood the discussion of treatment and procedures well. All questions were answered thoroughly reviewed. Debridement of mycotic and hypertrophic toenails, 1 through 5 bilateral and clearing of subungual debris. No ulceration, no infection noted.  Return Visit-Office Procedure: Patient instructed to return to the office for a follow up visit 3 months for continued evaluation and treatment.    Helane GuntherGregory Prue Lingenfelter DPM

## 2018-04-03 ENCOUNTER — Other Ambulatory Visit: Payer: Self-pay | Admitting: Adult Health

## 2018-04-03 DIAGNOSIS — N183 Chronic kidney disease, stage 3 unspecified: Secondary | ICD-10-CM

## 2018-04-03 DIAGNOSIS — E1122 Type 2 diabetes mellitus with diabetic chronic kidney disease: Secondary | ICD-10-CM

## 2018-04-03 DIAGNOSIS — Z794 Long term (current) use of insulin: Principal | ICD-10-CM

## 2018-04-08 ENCOUNTER — Other Ambulatory Visit: Payer: Self-pay | Admitting: Adult Health

## 2018-04-08 DIAGNOSIS — M109 Gout, unspecified: Secondary | ICD-10-CM

## 2018-04-08 DIAGNOSIS — I4819 Other persistent atrial fibrillation: Secondary | ICD-10-CM

## 2018-04-08 DIAGNOSIS — G629 Polyneuropathy, unspecified: Secondary | ICD-10-CM

## 2018-04-08 DIAGNOSIS — K219 Gastro-esophageal reflux disease without esophagitis: Secondary | ICD-10-CM

## 2018-04-08 NOTE — Telephone Encounter (Signed)
Patient needs to contact her PCP for refills.

## 2018-04-11 ENCOUNTER — Emergency Department: Payer: Medicare Other

## 2018-04-11 ENCOUNTER — Inpatient Hospital Stay
Admission: EM | Admit: 2018-04-11 | Discharge: 2018-04-18 | DRG: 871 | Disposition: A | Discharge status: Skilled Nursing Facility | Payer: Medicare Other | Attending: Internal Medicine | Admitting: Internal Medicine

## 2018-04-11 ENCOUNTER — Other Ambulatory Visit: Payer: Self-pay

## 2018-04-11 DIAGNOSIS — Z79899 Other long term (current) drug therapy: Secondary | ICD-10-CM

## 2018-04-11 DIAGNOSIS — R6521 Severe sepsis with septic shock: Secondary | ICD-10-CM | POA: Diagnosis present

## 2018-04-11 DIAGNOSIS — R001 Bradycardia, unspecified: Secondary | ICD-10-CM | POA: Diagnosis present

## 2018-04-11 DIAGNOSIS — Y92009 Unspecified place in unspecified non-institutional (private) residence as the place of occurrence of the external cause: Secondary | ICD-10-CM

## 2018-04-11 DIAGNOSIS — E1165 Type 2 diabetes mellitus with hyperglycemia: Secondary | ICD-10-CM | POA: Diagnosis present

## 2018-04-11 DIAGNOSIS — S40029A Contusion of unspecified upper arm, initial encounter: Secondary | ICD-10-CM | POA: Diagnosis present

## 2018-04-11 DIAGNOSIS — E785 Hyperlipidemia, unspecified: Secondary | ICD-10-CM | POA: Diagnosis present

## 2018-04-11 DIAGNOSIS — Z7901 Long term (current) use of anticoagulants: Secondary | ICD-10-CM

## 2018-04-11 DIAGNOSIS — I2510 Atherosclerotic heart disease of native coronary artery without angina pectoris: Secondary | ICD-10-CM | POA: Diagnosis present

## 2018-04-11 DIAGNOSIS — Z881 Allergy status to other antibiotic agents status: Secondary | ICD-10-CM

## 2018-04-11 DIAGNOSIS — I130 Hypertensive heart and chronic kidney disease with heart failure and stage 1 through stage 4 chronic kidney disease, or unspecified chronic kidney disease: Secondary | ICD-10-CM | POA: Diagnosis present

## 2018-04-11 DIAGNOSIS — M109 Gout, unspecified: Secondary | ICD-10-CM

## 2018-04-11 DIAGNOSIS — J9601 Acute respiratory failure with hypoxia: Secondary | ICD-10-CM | POA: Diagnosis present

## 2018-04-11 DIAGNOSIS — A4151 Sepsis due to Escherichia coli [E. coli]: Secondary | ICD-10-CM | POA: Diagnosis not present

## 2018-04-11 DIAGNOSIS — I701 Atherosclerosis of renal artery: Secondary | ICD-10-CM | POA: Diagnosis present

## 2018-04-11 DIAGNOSIS — L80 Vitiligo: Secondary | ICD-10-CM | POA: Diagnosis present

## 2018-04-11 DIAGNOSIS — Z951 Presence of aortocoronary bypass graft: Secondary | ICD-10-CM

## 2018-04-11 DIAGNOSIS — N170 Acute kidney failure with tubular necrosis: Secondary | ICD-10-CM | POA: Diagnosis present

## 2018-04-11 DIAGNOSIS — Z794 Long term (current) use of insulin: Secondary | ICD-10-CM

## 2018-04-11 DIAGNOSIS — W19XXXA Unspecified fall, initial encounter: Secondary | ICD-10-CM

## 2018-04-11 DIAGNOSIS — Z79890 Hormone replacement therapy (postmenopausal): Secondary | ICD-10-CM

## 2018-04-11 DIAGNOSIS — N183 Chronic kidney disease, stage 3 unspecified: Secondary | ICD-10-CM

## 2018-04-11 DIAGNOSIS — F0390 Unspecified dementia without behavioral disturbance: Secondary | ICD-10-CM | POA: Diagnosis present

## 2018-04-11 DIAGNOSIS — N390 Urinary tract infection, site not specified: Secondary | ICD-10-CM

## 2018-04-11 DIAGNOSIS — W1830XA Fall on same level, unspecified, initial encounter: Secondary | ICD-10-CM | POA: Diagnosis present

## 2018-04-11 DIAGNOSIS — Z87440 Personal history of urinary (tract) infections: Secondary | ICD-10-CM

## 2018-04-11 DIAGNOSIS — E1142 Type 2 diabetes mellitus with diabetic polyneuropathy: Secondary | ICD-10-CM | POA: Diagnosis present

## 2018-04-11 DIAGNOSIS — Z888 Allergy status to other drugs, medicaments and biological substances status: Secondary | ICD-10-CM

## 2018-04-11 DIAGNOSIS — R740 Nonspecific elevation of levels of transaminase and lactic acid dehydrogenase [LDH]: Secondary | ICD-10-CM | POA: Diagnosis present

## 2018-04-11 DIAGNOSIS — E039 Hypothyroidism, unspecified: Secondary | ICD-10-CM | POA: Diagnosis present

## 2018-04-11 DIAGNOSIS — G9341 Metabolic encephalopathy: Secondary | ICD-10-CM | POA: Diagnosis present

## 2018-04-11 DIAGNOSIS — E872 Acidosis, unspecified: Secondary | ICD-10-CM

## 2018-04-11 DIAGNOSIS — R319 Hematuria, unspecified: Secondary | ICD-10-CM

## 2018-04-11 DIAGNOSIS — E1122 Type 2 diabetes mellitus with diabetic chronic kidney disease: Secondary | ICD-10-CM | POA: Diagnosis present

## 2018-04-11 DIAGNOSIS — I509 Heart failure, unspecified: Secondary | ICD-10-CM | POA: Diagnosis present

## 2018-04-11 DIAGNOSIS — I480 Paroxysmal atrial fibrillation: Secondary | ICD-10-CM | POA: Diagnosis present

## 2018-04-11 DIAGNOSIS — I4891 Unspecified atrial fibrillation: Secondary | ICD-10-CM

## 2018-04-11 DIAGNOSIS — D7589 Other specified diseases of blood and blood-forming organs: Secondary | ICD-10-CM | POA: Diagnosis present

## 2018-04-11 DIAGNOSIS — A419 Sepsis, unspecified organism: Secondary | ICD-10-CM

## 2018-04-11 DIAGNOSIS — Z452 Encounter for adjustment and management of vascular access device: Secondary | ICD-10-CM

## 2018-04-11 DIAGNOSIS — R109 Unspecified abdominal pain: Secondary | ICD-10-CM

## 2018-04-11 DIAGNOSIS — E869 Volume depletion, unspecified: Secondary | ICD-10-CM | POA: Diagnosis present

## 2018-04-11 DIAGNOSIS — R4182 Altered mental status, unspecified: Secondary | ICD-10-CM | POA: Diagnosis not present

## 2018-04-11 DIAGNOSIS — D696 Thrombocytopenia, unspecified: Secondary | ICD-10-CM | POA: Diagnosis present

## 2018-04-11 DIAGNOSIS — N179 Acute kidney failure, unspecified: Secondary | ICD-10-CM

## 2018-04-11 LAB — CBC WITH DIFFERENTIAL/PLATELET
BASOS ABS: 0 10*3/uL (ref 0–0.1)
Basophils Relative: 1 %
EOS PCT: 1 %
Eosinophils Absolute: 0.1 10*3/uL (ref 0–0.7)
HCT: 41.2 % (ref 35.0–47.0)
Hemoglobin: 14.2 g/dL (ref 12.0–16.0)
LYMPHS PCT: 8 %
Lymphs Abs: 0.6 10*3/uL — ABNORMAL LOW (ref 1.0–3.6)
MCH: 36.4 pg — ABNORMAL HIGH (ref 26.0–34.0)
MCHC: 34.6 g/dL (ref 32.0–36.0)
MCV: 105.2 fL — AB (ref 80.0–100.0)
Monocytes Absolute: 0.3 10*3/uL (ref 0.2–0.9)
Monocytes Relative: 4 %
NEUTROS ABS: 7.4 10*3/uL — AB (ref 1.4–6.5)
NEUTROS PCT: 88 %
PLATELETS: 72 10*3/uL — AB (ref 150–440)
RBC: 3.91 MIL/uL (ref 3.80–5.20)
RDW: 15 % — ABNORMAL HIGH (ref 11.5–14.5)
WBC: 8.5 10*3/uL (ref 3.6–11.0)

## 2018-04-11 LAB — COMPREHENSIVE METABOLIC PANEL
ALT: 26 U/L (ref 0–44)
AST: 59 U/L — AB (ref 15–41)
Albumin: 4.1 g/dL (ref 3.5–5.0)
Alkaline Phosphatase: 110 U/L (ref 38–126)
Anion gap: 10 (ref 5–15)
BUN: 35 mg/dL — AB (ref 8–23)
CHLORIDE: 98 mmol/L (ref 98–111)
CO2: 30 mmol/L (ref 22–32)
CREATININE: 2.37 mg/dL — AB (ref 0.44–1.00)
Calcium: 9.6 mg/dL (ref 8.9–10.3)
GFR calc Af Amer: 21 mL/min — ABNORMAL LOW (ref >60)
GFR, EST NON AFRICAN AMERICAN: 18 mL/min — AB (ref >60)
Glucose, Bld: 255 mg/dL — ABNORMAL HIGH (ref 70–99)
Potassium: 4.3 mmol/L (ref 3.5–5.1)
SODIUM: 138 mmol/L (ref 135–145)
Total Bilirubin: 1.9 mg/dL — ABNORMAL HIGH (ref 0.3–1.2)
Total Protein: 7.1 g/dL (ref 6.5–8.1)

## 2018-04-11 LAB — LACTIC ACID, PLASMA: LACTIC ACID, VENOUS: 3.2 mmol/L — AB (ref 0.5–1.9)

## 2018-04-11 MED ORDER — ACETAMINOPHEN 325 MG PO TABS
ORAL_TABLET | ORAL | Status: AC
  Filled 2018-04-11: qty 2

## 2018-04-11 MED ORDER — ACETAMINOPHEN 325 MG PO TABS
650.0000 mg | ORAL_TABLET | Freq: Once | ORAL | Status: AC
  Administered 2018-04-11: 650 mg via ORAL

## 2018-04-11 MED ORDER — SODIUM CHLORIDE 0.9 % IV BOLUS
1000.0000 mL | Freq: Once | INTRAVENOUS | Status: AC
  Administered 2018-04-11: 1000 mL via INTRAVENOUS

## 2018-04-11 MED ORDER — SODIUM CHLORIDE 0.9 % IV SOLN
2.0000 g | Freq: Once | INTRAVENOUS | Status: AC
  Administered 2018-04-11: 2 g via INTRAVENOUS
  Filled 2018-04-11: qty 2

## 2018-04-11 NOTE — ED Triage Notes (Signed)
Pt arrived from home via EMS with complaints of an unwittnessed fall. Pt was found in bathroom floor by family. Pt is awake but appears to have altered mental status. Pt is shaking, was negative on stroke scale per EMS. Pt has Hx of UTIs. Pt BS- 300. Pt is not a known diabetic. EMS placed a 20 in left hand and a 22 in right AC. EMS reported that pt lungs are clear. Pt denies pain at this time

## 2018-04-11 NOTE — ED Notes (Signed)
Date and time results received: 04/11/18 2332 (use smartphrase ".now" to insert current time)  Test: lactic acid Critical Value: 3.2  Name of Provider Notified: Dr. Zenda AlpersWEbster  Orders Received? Or Actions Taken?:

## 2018-04-12 ENCOUNTER — Inpatient Hospital Stay: Payer: Medicare Other

## 2018-04-12 ENCOUNTER — Inpatient Hospital Stay: Payer: Self-pay

## 2018-04-12 ENCOUNTER — Encounter: Payer: Self-pay | Admitting: Internal Medicine

## 2018-04-12 DIAGNOSIS — J9601 Acute respiratory failure with hypoxia: Secondary | ICD-10-CM | POA: Diagnosis present

## 2018-04-12 DIAGNOSIS — W1830XA Fall on same level, unspecified, initial encounter: Secondary | ICD-10-CM | POA: Diagnosis present

## 2018-04-12 DIAGNOSIS — E872 Acidosis: Secondary | ICD-10-CM | POA: Diagnosis present

## 2018-04-12 DIAGNOSIS — A4151 Sepsis due to Escherichia coli [E. coli]: Secondary | ICD-10-CM | POA: Diagnosis present

## 2018-04-12 DIAGNOSIS — S40029A Contusion of unspecified upper arm, initial encounter: Secondary | ICD-10-CM | POA: Diagnosis present

## 2018-04-12 DIAGNOSIS — R6521 Severe sepsis with septic shock: Secondary | ICD-10-CM | POA: Diagnosis present

## 2018-04-12 DIAGNOSIS — R740 Nonspecific elevation of levels of transaminase and lactic acid dehydrogenase [LDH]: Secondary | ICD-10-CM | POA: Diagnosis present

## 2018-04-12 DIAGNOSIS — I130 Hypertensive heart and chronic kidney disease with heart failure and stage 1 through stage 4 chronic kidney disease, or unspecified chronic kidney disease: Secondary | ICD-10-CM | POA: Diagnosis present

## 2018-04-12 DIAGNOSIS — Y92009 Unspecified place in unspecified non-institutional (private) residence as the place of occurrence of the external cause: Secondary | ICD-10-CM | POA: Diagnosis not present

## 2018-04-12 DIAGNOSIS — D7589 Other specified diseases of blood and blood-forming organs: Secondary | ICD-10-CM | POA: Diagnosis present

## 2018-04-12 DIAGNOSIS — E869 Volume depletion, unspecified: Secondary | ICD-10-CM | POA: Diagnosis present

## 2018-04-12 DIAGNOSIS — G9341 Metabolic encephalopathy: Secondary | ICD-10-CM | POA: Diagnosis present

## 2018-04-12 DIAGNOSIS — I509 Heart failure, unspecified: Secondary | ICD-10-CM | POA: Diagnosis present

## 2018-04-12 DIAGNOSIS — E1142 Type 2 diabetes mellitus with diabetic polyneuropathy: Secondary | ICD-10-CM | POA: Diagnosis present

## 2018-04-12 DIAGNOSIS — D696 Thrombocytopenia, unspecified: Secondary | ICD-10-CM | POA: Diagnosis present

## 2018-04-12 DIAGNOSIS — R001 Bradycardia, unspecified: Secondary | ICD-10-CM | POA: Diagnosis present

## 2018-04-12 DIAGNOSIS — E1122 Type 2 diabetes mellitus with diabetic chronic kidney disease: Secondary | ICD-10-CM | POA: Diagnosis present

## 2018-04-12 DIAGNOSIS — I480 Paroxysmal atrial fibrillation: Secondary | ICD-10-CM | POA: Diagnosis present

## 2018-04-12 DIAGNOSIS — E1165 Type 2 diabetes mellitus with hyperglycemia: Secondary | ICD-10-CM | POA: Diagnosis present

## 2018-04-12 DIAGNOSIS — N183 Chronic kidney disease, stage 3 (moderate): Secondary | ICD-10-CM | POA: Diagnosis present

## 2018-04-12 DIAGNOSIS — N390 Urinary tract infection, site not specified: Secondary | ICD-10-CM | POA: Diagnosis present

## 2018-04-12 DIAGNOSIS — A419 Sepsis, unspecified organism: Secondary | ICD-10-CM | POA: Diagnosis present

## 2018-04-12 DIAGNOSIS — F0390 Unspecified dementia without behavioral disturbance: Secondary | ICD-10-CM | POA: Diagnosis present

## 2018-04-12 DIAGNOSIS — R0603 Acute respiratory distress: Secondary | ICD-10-CM | POA: Diagnosis not present

## 2018-04-12 DIAGNOSIS — R4182 Altered mental status, unspecified: Secondary | ICD-10-CM | POA: Diagnosis present

## 2018-04-12 DIAGNOSIS — N170 Acute kidney failure with tubular necrosis: Secondary | ICD-10-CM | POA: Diagnosis present

## 2018-04-12 DIAGNOSIS — E039 Hypothyroidism, unspecified: Secondary | ICD-10-CM | POA: Diagnosis present

## 2018-04-12 DIAGNOSIS — L80 Vitiligo: Secondary | ICD-10-CM | POA: Diagnosis present

## 2018-04-12 LAB — PROTIME-INR
INR: 2.16
INR: 1.98
PROTHROMBIN TIME: 23.9 s — AB (ref 11.4–15.2)
PROTHROMBIN TIME: 22.3 s — AB (ref 11.4–15.2)

## 2018-04-12 LAB — BLOOD CULTURE ID PANEL (REFLEXED)
ACINETOBACTER BAUMANNII: NOT DETECTED
CANDIDA GLABRATA: NOT DETECTED
CANDIDA TROPICALIS: NOT DETECTED
Candida albicans: NOT DETECTED
Candida krusei: NOT DETECTED
Candida parapsilosis: NOT DETECTED
Carbapenem resistance: NOT DETECTED
ESCHERICHIA COLI: DETECTED — AB
Enterobacter cloacae complex: NOT DETECTED
Enterobacteriaceae species: DETECTED — AB
Enterococcus species: NOT DETECTED
Haemophilus influenzae: NOT DETECTED
KLEBSIELLA PNEUMONIAE: NOT DETECTED
Klebsiella oxytoca: NOT DETECTED
Listeria monocytogenes: NOT DETECTED
NEISSERIA MENINGITIDIS: NOT DETECTED
PROTEUS SPECIES: NOT DETECTED
Pseudomonas aeruginosa: NOT DETECTED
SERRATIA MARCESCENS: NOT DETECTED
STAPHYLOCOCCUS SPECIES: NOT DETECTED
STREPTOCOCCUS AGALACTIAE: NOT DETECTED
Staphylococcus aureus (BCID): NOT DETECTED
Streptococcus pneumoniae: NOT DETECTED
Streptococcus pyogenes: NOT DETECTED
Streptococcus species: NOT DETECTED

## 2018-04-12 LAB — URINALYSIS, COMPLETE (UACMP) WITH MICROSCOPIC
BILIRUBIN URINE: NEGATIVE
Glucose, UA: >=500 mg/dL — AB
Ketones, ur: NEGATIVE mg/dL
NITRITE: NEGATIVE
PROTEIN: 100 mg/dL — AB
SPECIFIC GRAVITY, URINE: 1.01 (ref 1.005–1.030)
pH: 5 (ref 5.0–8.0)

## 2018-04-12 LAB — GLUCOSE, CAPILLARY
GLUCOSE-CAPILLARY: 303 mg/dL — AB (ref 70–99)
Glucose-Capillary: 298 mg/dL — ABNORMAL HIGH (ref 70–99)
Glucose-Capillary: 171 mg/dL — ABNORMAL HIGH (ref 70–99)
Glucose-Capillary: 132 mg/dL — ABNORMAL HIGH (ref 70–99)
Glucose-Capillary: 204 mg/dL — ABNORMAL HIGH (ref 70–99)

## 2018-04-12 LAB — PROCALCITONIN: PROCALCITONIN: 7.3 ng/mL

## 2018-04-12 LAB — MRSA PCR SCREENING: MRSA BY PCR: NEGATIVE

## 2018-04-12 LAB — LACTIC ACID, PLASMA: Lactic Acid, Venous: 2.5 mmol/L (ref 0.5–1.9)

## 2018-04-12 MED ORDER — DOPAMINE-DEXTROSE 3.2-5 MG/ML-% IV SOLN
2.0000 ug/kg/min | INTRAVENOUS | Status: DC
  Administered 2018-04-12: 5 ug/kg/min via INTRAVENOUS
  Filled 2018-04-12: qty 250

## 2018-04-12 MED ORDER — ONDANSETRON HCL 4 MG/2ML IJ SOLN: 4.0000 mg | Freq: Four times a day (QID) | INTRAMUSCULAR | Status: DC | PRN

## 2018-04-12 MED ORDER — FERROUS SULFATE 325 (65 FE) MG PO TABS
325.0000 mg | ORAL_TABLET | Freq: Every day | ORAL | Status: DC
  Administered 2018-04-12 – 2018-04-18 (×7): 325 mg via ORAL
  Filled 2018-04-12 (×6): qty 1

## 2018-04-12 MED ORDER — PANTOPRAZOLE SODIUM 40 MG PO TBEC
40.0000 mg | DELAYED_RELEASE_TABLET | Freq: Every day | ORAL | Status: DC
  Administered 2018-04-12 – 2018-04-18 (×7): 40 mg via ORAL
  Filled 2018-04-12 (×6): qty 1

## 2018-04-12 MED ORDER — LEVOTHYROXINE SODIUM 112 MCG PO TABS
112.0000 ug | ORAL_TABLET | Freq: Every day | ORAL | Status: DC
  Administered 2018-04-12 – 2018-04-18 (×7): 112 ug via ORAL
  Filled 2018-04-12 (×7): qty 1

## 2018-04-12 MED ORDER — HYDROCODONE-ACETAMINOPHEN 5-325 MG PO TABS
1.0000 | ORAL_TABLET | Freq: Two times a day (BID) | ORAL | Status: DC | PRN
  Administered 2018-04-14 – 2018-04-17 (×3): 1 via ORAL
  Filled 2018-04-12 (×3): qty 1

## 2018-04-12 MED ORDER — SODIUM CHLORIDE 0.9 % IV SOLN
500.0000 mg | Freq: Two times a day (BID) | INTRAVENOUS | Status: DC
  Administered 2018-04-12 – 2018-04-14 (×5): 500 mg via INTRAVENOUS
  Filled 2018-04-12: qty 500
  Filled 2018-04-12: qty 0.5
  Filled 2018-04-12 (×4): qty 500

## 2018-04-12 MED ORDER — SENNOSIDES-DOCUSATE SODIUM 8.6-50 MG PO TABS: 1.0000 | ORAL_TABLET | Freq: Every evening | ORAL | Status: DC | PRN

## 2018-04-12 MED ORDER — IBUPROFEN 600 MG PO TABS
ORAL_TABLET | ORAL | Status: AC
  Filled 2018-04-12: qty 1

## 2018-04-12 MED ORDER — VITAMIN D3 25 MCG (1000 UNIT) PO TABS
2000.0000 [IU] | ORAL_TABLET | Freq: Every day | ORAL | Status: DC
  Administered 2018-04-12 – 2018-04-18 (×7): 2000 [IU] via ORAL
  Filled 2018-04-12 (×6): qty 2

## 2018-04-12 MED ORDER — WARFARIN SODIUM 1 MG PO TABS: 1.0000 mg | ORAL_TABLET | ORAL | Status: DC

## 2018-04-12 MED ORDER — BISACODYL 5 MG PO TBEC
5.0000 mg | DELAYED_RELEASE_TABLET | Freq: Every day | ORAL | Status: DC | PRN
  Administered 2018-04-16: 5 mg via ORAL
  Filled 2018-04-12: qty 1

## 2018-04-12 MED ORDER — SODIUM CHLORIDE 0.9 % IV BOLUS
1000.0000 mL | Freq: Once | INTRAVENOUS | Status: AC
  Administered 2018-04-12: 1000 mL via INTRAVENOUS

## 2018-04-12 MED ORDER — SODIUM CHLORIDE 0.9 % IV SOLN
1.0000 g | INTRAVENOUS | Status: DC
  Filled 2018-04-12: qty 1

## 2018-04-12 MED ORDER — INSULIN ASPART PROT & ASPART (70-30 MIX) 100 UNIT/ML ~~LOC~~ SUSP: 15.0000 [IU] | Freq: Two times a day (BID) | SUBCUTANEOUS | Status: DC

## 2018-04-12 MED ORDER — LORAZEPAM 2 MG/ML IJ SOLN
1.0000 mg | Freq: Once | INTRAMUSCULAR | Status: AC
  Administered 2018-04-12: 1 mg via INTRAVENOUS
  Filled 2018-04-12: qty 1

## 2018-04-12 MED ORDER — SODIUM CHLORIDE 0.9 % IV SOLN
INTRAVENOUS | Status: DC | PRN
  Administered 2018-04-12: 1000 mL via INTRAVENOUS
  Administered 2018-04-14 – 2018-04-15 (×2): 500 mL via INTRAVENOUS

## 2018-04-12 MED ORDER — ACETAMINOPHEN 650 MG RE SUPP: 650.0000 mg | Freq: Four times a day (QID) | RECTAL | Status: DC | PRN

## 2018-04-12 MED ORDER — ISOSORBIDE MONONITRATE 20 MG PO TABS
20.0000 mg | ORAL_TABLET | Freq: Every day | ORAL | Status: DC
  Administered 2018-04-15 – 2018-04-18 (×4): 20 mg via ORAL
  Filled 2018-04-12 (×7): qty 1

## 2018-04-12 MED ORDER — SIMVASTATIN 20 MG PO TABS
40.0000 mg | ORAL_TABLET | Freq: Every day | ORAL | Status: DC
  Administered 2018-04-12 – 2018-04-17 (×6): 40 mg via ORAL
  Filled 2018-04-12 (×6): qty 2

## 2018-04-12 MED ORDER — ONDANSETRON HCL 4 MG PO TABS: 4.0000 mg | ORAL_TABLET | Freq: Four times a day (QID) | ORAL | Status: DC | PRN

## 2018-04-12 MED ORDER — WARFARIN SODIUM 2 MG PO TABS
2.0000 mg | ORAL_TABLET | ORAL | Status: DC
  Administered 2018-04-12: 2 mg via ORAL
  Filled 2018-04-12: qty 1

## 2018-04-12 MED ORDER — POTASSIUM CHLORIDE CRYS ER 20 MEQ PO TBCR
20.0000 meq | EXTENDED_RELEASE_TABLET | Freq: Every day | ORAL | Status: DC
  Administered 2018-04-13 – 2018-04-18 (×6): 20 meq via ORAL
  Filled 2018-04-12 (×6): qty 1

## 2018-04-12 MED ORDER — FUROSEMIDE 10 MG/ML IJ SOLN
20.0000 mg | Freq: Once | INTRAMUSCULAR | Status: AC
  Administered 2018-04-12: 20 mg via INTRAVENOUS
  Filled 2018-04-12: qty 2

## 2018-04-12 MED ORDER — VITAMIN B-12 1000 MCG PO TABS
5000.0000 ug | ORAL_TABLET | Freq: Every day | ORAL | Status: DC
  Administered 2018-04-12 – 2018-04-18 (×7): 5000 ug via ORAL
  Filled 2018-04-12 (×6): qty 5

## 2018-04-12 MED ORDER — CALCITRIOL 0.25 MCG PO CAPS
0.2500 ug | ORAL_CAPSULE | ORAL | Status: DC
  Administered 2018-04-13 – 2018-04-18 (×3): 0.25 ug via ORAL
  Filled 2018-04-12 (×3): qty 1

## 2018-04-12 MED ORDER — GABAPENTIN 400 MG PO CAPS
800.0000 mg | ORAL_CAPSULE | Freq: Three times a day (TID) | ORAL | Status: DC
  Administered 2018-04-12 – 2018-04-18 (×19): 800 mg via ORAL
  Filled 2018-04-12 (×18): qty 2

## 2018-04-12 MED ORDER — INSULIN ASPART 100 UNIT/ML ~~LOC~~ SOLN
0.0000 [IU] | Freq: Every day | SUBCUTANEOUS | Status: DC
  Administered 2018-04-12: 2 [IU] via SUBCUTANEOUS
  Filled 2018-04-12: qty 1

## 2018-04-12 MED ORDER — INSULIN ASPART PROT & ASPART (70-30 MIX) 100 UNIT/ML ~~LOC~~ SUSP
15.0000 [IU] | Freq: Two times a day (BID) | SUBCUTANEOUS | Status: DC
  Administered 2018-04-12 – 2018-04-18 (×11): 15 [IU] via SUBCUTANEOUS
  Filled 2018-04-12 (×10): qty 10

## 2018-04-12 MED ORDER — ROPINIROLE HCL 1 MG PO TABS
0.5000 mg | ORAL_TABLET | Freq: Every day | ORAL | Status: DC
  Administered 2018-04-12 – 2018-04-17 (×6): 0.5 mg via ORAL
  Filled 2018-04-12: qty 1
  Filled 2018-04-12: qty 2
  Filled 2018-04-12 (×2): qty 1
  Filled 2018-04-12: qty 2
  Filled 2018-04-12: qty 1
  Filled 2018-04-12: qty 2

## 2018-04-12 MED ORDER — METOPROLOL SUCCINATE ER 25 MG PO TB24
25.0000 mg | ORAL_TABLET | Freq: Every day | ORAL | Status: DC
  Filled 2018-04-12: qty 1

## 2018-04-12 MED ORDER — SODIUM CHLORIDE 0.9 % IV SOLN
Freq: Once | INTRAVENOUS | Status: AC
  Administered 2018-04-12: 03:00:00 via INTRAVENOUS

## 2018-04-12 MED ORDER — SODIUM CHLORIDE 0.9% FLUSH
10.0000 mL | Freq: Two times a day (BID) | INTRAVENOUS | Status: DC
  Administered 2018-04-12 – 2018-04-13 (×3): 30 mL
  Administered 2018-04-14 – 2018-04-18 (×7): 10 mL

## 2018-04-12 MED ORDER — SODIUM CHLORIDE 0.9% FLUSH: 10.0000 mL | INTRAVENOUS | Status: DC | PRN

## 2018-04-12 MED ORDER — INSULIN ASPART 100 UNIT/ML ~~LOC~~ SOLN
0.0000 [IU] | Freq: Three times a day (TID) | SUBCUTANEOUS | Status: DC
  Administered 2018-04-12: 11 [IU] via SUBCUTANEOUS
  Administered 2018-04-12: 3 [IU] via SUBCUTANEOUS
  Administered 2018-04-12: 2 [IU] via SUBCUTANEOUS
  Administered 2018-04-13 – 2018-04-17 (×12): 3 [IU] via SUBCUTANEOUS
  Administered 2018-04-18: 5 [IU] via SUBCUTANEOUS
  Filled 2018-04-12 (×15): qty 1

## 2018-04-12 MED ORDER — ACETAMINOPHEN 325 MG PO TABS
650.0000 mg | ORAL_TABLET | Freq: Once | ORAL | Status: AC
  Administered 2018-04-12: 650 mg via ORAL
  Filled 2018-04-12: qty 2

## 2018-04-12 MED ORDER — FUROSEMIDE 20 MG PO TABS
20.0000 mg | ORAL_TABLET | Freq: Two times a day (BID) | ORAL | Status: DC
  Administered 2018-04-13 – 2018-04-18 (×11): 20 mg via ORAL
  Filled 2018-04-12 (×10): qty 1

## 2018-04-12 MED ORDER — INSULIN ISOPHANE & REGULAR (HUMAN 70-30)100 UNIT/ML KWIKPEN: 15.0000 [IU] | PEN_INJECTOR | Freq: Two times a day (BID) | SUBCUTANEOUS | Status: DC

## 2018-04-12 MED ORDER — ACETAMINOPHEN 325 MG PO TABS
650.0000 mg | ORAL_TABLET | Freq: Four times a day (QID) | ORAL | Status: DC | PRN
  Administered 2018-04-12: 650 mg via ORAL
  Filled 2018-04-12 (×2): qty 2

## 2018-04-12 MED ORDER — IBUPROFEN 600 MG PO TABS
600.0000 mg | ORAL_TABLET | Freq: Once | ORAL | Status: AC
  Administered 2018-04-12: 600 mg via ORAL

## 2018-04-12 NOTE — Progress Notes (Signed)
Bipap on standby, Pt on 3L [place] sats 96%

## 2018-04-12 NOTE — ED Notes (Signed)
Locked out of pt record in sunquest, lab called regarding error message that pt was currently being processed. Sending lab tubes w/ [place] labels.

## 2018-04-12 NOTE — ED Notes (Signed)
Called to give report to floor. They would like to call me back in 5 min

## 2018-04-12 NOTE — Progress Notes (Signed)
PHARMACY - PHYSICIAN COMMUNICATION CRITICAL VALUE ALERT - BLOOD CULTURE IDENTIFICATION (BCID)  Haley Hill is an 77 y.o. female who [facility] with Sepsis secondary to UTI  Assessment:  BCID showing E. Coli   Name of physician (or Provider) Contacted: Dr. Lonn Georgiaonforti   Current antibiotics: Cefepime  Changes to prescribed antibiotics recommended:  Will change Abx to Meropenem and wait for sensitivities   Results for orders placed or performed during the hospital encounter of 04/11/18  Blood Culture ID Panel (Reflexed) (Collected: 04/11/2018 10:31 PM)  Result Value Ref Range   Enterococcus species NOT DETECTED NOT DETECTED   Listeria monocytogenes NOT DETECTED NOT DETECTED   Staphylococcus species NOT DETECTED NOT DETECTED   Staphylococcus aureus NOT DETECTED NOT DETECTED   Streptococcus species NOT DETECTED NOT DETECTED   Streptococcus agalactiae NOT DETECTED NOT DETECTED   Streptococcus pneumoniae NOT DETECTED NOT DETECTED   Streptococcus pyogenes NOT DETECTED NOT DETECTED   Acinetobacter baumannii NOT DETECTED NOT DETECTED   Enterobacteriaceae species DETECTED (A) NOT DETECTED   Enterobacter cloacae complex NOT DETECTED NOT DETECTED   Escherichia coli DETECTED (A) NOT DETECTED   Klebsiella oxytoca NOT DETECTED NOT DETECTED   Klebsiella pneumoniae NOT DETECTED NOT DETECTED   Proteus species NOT DETECTED NOT DETECTED   Serratia marcescens NOT DETECTED NOT DETECTED   Carbapenem resistance NOT DETECTED NOT DETECTED   Haemophilus influenzae NOT DETECTED NOT DETECTED   Neisseria meningitidis NOT DETECTED NOT DETECTED   Pseudomonas aeruginosa NOT DETECTED NOT DETECTED   Candida albicans NOT DETECTED NOT DETECTED   Candida glabrata NOT DETECTED NOT DETECTED   Candida krusei NOT DETECTED NOT DETECTED   Candida parapsilosis NOT DETECTED NOT DETECTED   Candida tropicalis NOT DETECTED NOT DETECTED    Gardner CandleSheema M Washington Whedbee, PharmD, BCPS Clinical  Pharmacist 04/12/2018 10:59 AM

## 2018-04-12 NOTE — ED Notes (Signed)
Took pt off bipap and placed her on 3L nasal cannula per Helmut MusterAlicia RN requests to try and wean pt off bipap in order to place pt on another floor. Only 1 ICU bed open.

## 2018-04-12 NOTE — Progress Notes (Signed)
eLink Physician-Brief Progress Note Patient Name: Fredrik RiggerJean S Castille DOB: [birth year] MRN: [id number]   Date of Service  04/12/2018  HPI/Events of Note  77 yo admitted with UTI and sepsis. Developed resp failure following volume resuscitation. To SDU / ICU for biPAP. Appears to be tolerating well, no distress on camera eval. Will follow  eICU Interventions       Intervention Category Major Interventions: Sepsis - evaluation and management;Respiratory failure - evaluation and management  Leslye PeerRobert S Steffen Hase 04/12/2018, 5:04 AM

## 2018-04-12 NOTE — Progress Notes (Signed)
Peripherally Inserted Central Catheter/Midline Placement  The IV Nurse has discussed with the patient and/or persons authorized to consent for the patient, the purpose of this procedure and the potential benefits and risks involved with this procedure.  The benefits include less needle sticks, lab draws from the catheter, and the patient may be discharged home with the catheter. Risks include, but not limited to, infection, bleeding, blood clot (thrombus formation), and puncture of an artery; nerve damage and irregular heartbeat and possibility to perform a PICC exchange if needed/ordered by physician.  Alternatives to this procedure were also discussed.  Bard Power PICC patient education guide, fact sheet on infection prevention and patient information card has been provided to patient /or left at bedside.    PICC/Midline Placement Documentation  PICC Double Lumen 04/12/18 PICC Right Brachial 38 cm 0 cm (Active)  Indication for Insertion or Continuance of Line Vasoactive infusions 04/12/2018  1:25 PM  Exposed Catheter (cm) 0 cm 04/12/2018  1:25 PM  Site Assessment Clean;Dry;Intact 04/12/2018  1:25 PM  Lumen #1 Status Flushed;Saline locked;Blood return noted 04/12/2018  1:25 PM  Lumen #2 Status Flushed;Saline locked;Blood return noted 04/12/2018  1:25 PM  Dressing Type Transparent 04/12/2018  1:25 PM  Dressing Status Clean;Dry;Intact;Antimicrobial disc in place 04/12/2018  1:25 PM  Dressing Change Due 04/19/18 04/12/2018  1:25 PM       Ethelda Chickurrie, Krisandra Bueno Robert 04/12/2018, 1:26 PM

## 2018-04-12 NOTE — Progress Notes (Signed)
[employer] - [place] at [facility]   PATIENT NAME: Haley Hill    MR#:  [id number]  DATE OF BIRTH:  September 03, 1940  SUBJECTIVE:  States she is feeling better this morning. Still feeling weak. SOB has improved. No fevers or chills.  REVIEW OF SYSTEMS:  Review of Systems  Constitutional: Negative for chills and fever.  HENT: Negative for congestion and sore throat.   Eyes: Negative for blurred vision and double vision.  Respiratory: Positive for shortness of breath. Negative for cough.   Cardiovascular: Negative for chest pain and palpitations.  Gastrointestinal: Negative for abdominal pain, nausea and vomiting.  Genitourinary: Positive for frequency and urgency. Negative for dysuria.  Neurological: Positive for weakness. Negative for dizziness and headaches.  Psychiatric/Behavioral: Negative for depression. The patient is not nervous/anxious.     DRUG ALLERGIES:   Allergies  Allergen Reactions  . Ciprofloxacin Itching and Rash  . Fosamax [Alendronate Sodium] Other (See Comments)    Reaction: Ulcers in mouth and esophagus  . Iron Other (See Comments)   VITALS:  Blood pressure (!) 72/49, pulse (!) 49, temperature 98.1 F (36.7 C), temperature source Oral, resp. rate 15, height 5\' 6"  (1.676 m), weight 97.3 kg, SpO2 98 %. PHYSICAL EXAMINATION:  Physical Exam  Constitutional: She appears well-developed and well-nourished. Laying in bed, in NAD HENT: Normocephalic and atraumatic. Oropharynx is clear and moist. No oropharyngeal exudate.  Eyes: Conjunctivae, EOM and lids are normal. No scleral icterus.  Neck: Neck supple. No JVD present. No thyromegaly present.  Cardiovascular: Irregularly irregular rhythm, normal rate, S1 normal, S2 normal. No murmurs, rubs, or gallops. Pulmonary/Chest: [place] in place. Normal work of breathing. +bibasilar crackles present. No wheezing or rhonchi. Abdominal: +BS, soft, non-tender, non-distended Neurological: Cn 2-12 grossly intact,  +global weakness. Sensation intact to light touch throughout. Skin: Skin is warm and dry. No rash noted. +venous stasis changes in the lower extremities bilaterally Psychiatric: alert and oriented x 3. Appropriate affect. LABORATORY PANEL:  Female CBC Recent Labs  Lab 04/11/18 2211  WBC 8.5  HGB 14.2  HCT 41.2  PLT 72*   ------------------------------------------------------------------------------------------------------------------ Chemistries  Recent Labs  Lab 04/11/18 2211  NA 138  K 4.3  CL 98  CO2 30  GLUCOSE 255*  BUN 35*  CREATININE 2.37*  CALCIUM 9.6  AST 59*  ALT 26  ALKPHOS 110  BILITOT 1.9*   RADIOLOGY:  Ct Head Wo Contrast  Result Date: 04/11/2018 CLINICAL DATA:  Unwitnessed fall. Found on bathroom floor by family. Altered mental status. EXAM: CT HEAD WITHOUT CONTRAST CT CERVICAL SPINE WITHOUT CONTRAST TECHNIQUE: Multidetector CT imaging of the head and cervical spine was performed following the standard protocol without intravenous contrast. Multiplanar CT image reconstructions of the cervical spine were also generated. COMPARISON:  Head CT 02/20/2018 FINDINGS: CT HEAD FINDINGS Brain: Generalized atrophy unchanged from prior exam. Similar degree of chronic small vessel ischemia. No intracranial hemorrhage, mass effect, or midline shift. No hydrocephalus. The basilar cisterns are patent. No evidence of territorial infarct or acute ischemia. No extra-axial or intracranial fluid collection. Minimal air in the region of the cavernous sinus, typically seen with IV catheter placement. Vascular: Atherosclerosis of skullbase vasculature without hyperdense vessel or abnormal calcification. Skull: No fracture or focal lesion. Sinuses/Orbits: No sinus fluid level or signs of inflammation. Orbits are unremarkable. Other: None. CT CERVICAL SPINE FINDINGS Alignment: Normal. Skull base and vertebrae: No acute fracture. Vertebral body heights are maintained. The dens and skull base are  intact. Soft tissues and  spinal canal: No prevertebral fluid or swelling. No visible canal hematoma. Disc levels: Mild diffuse endplate spurring. Disc space narrowing at C6-C7. Scattered facet arthropathy. Upper chest: Carotid calcifications. Other: None. IMPRESSION: 1. No acute intracranial abnormality. Unchanged atrophy and chronic small vessel ischemia. 2. Degenerative change in the cervical spine without acute fracture or subluxation. Electronically Signed   By: Rubye OaksMelanie  Ehinger M.D.   On: 04/11/2018 23:08   Ct Cervical Spine Wo Contrast  Result Date: 04/11/2018 CLINICAL DATA:  Unwitnessed fall. Found on bathroom floor by family. Altered mental status. EXAM: CT HEAD WITHOUT CONTRAST CT CERVICAL SPINE WITHOUT CONTRAST TECHNIQUE: Multidetector CT imaging of the head and cervical spine was performed following the standard protocol without intravenous contrast. Multiplanar CT image reconstructions of the cervical spine were also generated. COMPARISON:  Head CT 02/20/2018 FINDINGS: CT HEAD FINDINGS Brain: Generalized atrophy unchanged from prior exam. Similar degree of chronic small vessel ischemia. No intracranial hemorrhage, mass effect, or midline shift. No hydrocephalus. The basilar cisterns are patent. No evidence of territorial infarct or acute ischemia. No extra-axial or intracranial fluid collection. Minimal air in the region of the cavernous sinus, typically seen with IV catheter placement. Vascular: Atherosclerosis of skullbase vasculature without hyperdense vessel or abnormal calcification. Skull: No fracture or focal lesion. Sinuses/Orbits: No sinus fluid level or signs of inflammation. Orbits are unremarkable. Other: None. CT CERVICAL SPINE FINDINGS Alignment: Normal. Skull base and vertebrae: No acute fracture. Vertebral body heights are maintained. The dens and skull base are intact. Soft tissues and spinal canal: No prevertebral fluid or swelling. No visible canal hematoma. Disc levels: Mild  diffuse endplate spurring. Disc space narrowing at C6-C7. Scattered facet arthropathy. Upper chest: Carotid calcifications. Other: None. IMPRESSION: 1. No acute intracranial abnormality. Unchanged atrophy and chronic small vessel ischemia. 2. Degenerative change in the cervical spine without acute fracture or subluxation. Electronically Signed   By: Rubye OaksMelanie  Ehinger M.D.   On: 04/11/2018 23:08   [place] Renal  Result Date: 04/12/2018 CLINICAL DATA:  Acute renal failure. EXAM: RENAL / URINARY TRACT ULTRASOUND COMPLETE COMPARISON:  Abdominal ultrasound 10/08/2016 FINDINGS: Examination was limited by patient condition and positioning. Right Kidney: Length: 11.2 cm. Suboptimal assessment of the right kidney without hydronephrosis or gross mass. Left Kidney: Length: 9.8 cm. Echogenicity within normal limits. No hydronephrosis. 11.1 cm cyst, similar in size to the prior study. Bladder: Appears normal for degree of bladder distention. IMPRESSION: 1. Suboptimal assessment of the right kidney.  No hydronephrosis. 2. Chronic large left renal cyst. Electronically Signed   By: Sebastian AcheAllen  Grady M.D.   On: 04/12/2018 10:59   Dg Chest Port 1 View  Result Date: 04/12/2018 CLINICAL DATA:  PICC placement. EXAM: PORTABLE CHEST 1 VIEW COMPARISON:  Radiograph of April 11, 2018. FINDINGS: Stable cardiomegaly. Atherosclerosis of thoracic aorta is noted. No pneumothorax or pleural effusion is noted. Interval placement of right-sided PICC line with distal tip in expected position of SVC. No acute pulmonary disease is noted. Bony thorax is unremarkable. IMPRESSION: Interval placement of right-sided PICC line with distal tip in expected position of the SVC. Electronically Signed   By: Lupita RaiderJames  Green Jr, M.D.   On: 04/12/2018 13:48   Dg Chest Portable 1 View  Result Date: 04/11/2018 CLINICAL DATA:  Altered mental status EXAM: PORTABLE CHEST 1 VIEW COMPARISON:  02/20/2018 FINDINGS: Post sternotomy changes. Mild cardiomegaly with central  vascular congestion. Aortic atherosclerosis. No focal opacity or pleural effusion. No pneumothorax. IMPRESSION: Cardiomegaly with mild central vascular congestion. Electronically Signed   By:  Jasmine PangKim  Fujinaga M.D.   On: 04/11/2018 22:48   [place] Ekg Site Rite  Result Date: 04/12/2018 If Site Rite image not attached, placement could not be confirmed due to current cardiac rhythm.  ASSESSMENT AND PLAN:   Sepsis 2/2 to E. Coli bacteremia/UTI- blood culture growing E. Coli. Was recently admitted 6/30-7/03 and had pansensitive E. Coli UTI at that time.  - initially on vanc/cefepime, switched to meropenem - f/u blood and urine cultures  Acute hypoxic respiratory failure after volume resuscitation- initially on BiPAP, but has been weaned to 3L O2 by [place]. CXR with mild pulmonary vascular congestion. - IVFs stopped - given lasix 20mg  IV x 1, now on lasix 20mg  po bid - wean O2 as able  Hypotension- likely due to sepsis - holding home metoprolol - decision for pressors per CCM  Acute renal failure in CKD III- likely due to sepsis/hypotension. Cr 2.37 (baseline ~1.7) - renal US with unremarkable - avoid nephrotoxic agents - IVFs stopped  Chronic a-fib- initially tachycardic, but now bradycardic.  - continue coumadin - holding home metoprolol  Type 2 diabetes- blood sugars uncontrolled - novolog 70/30 15 units bid and moderate SSI  Transaminitis/hyperbilirubinemia- likely due to sepsis. - trend cmp  Hypothyroidism- stable - continue synthroid   All the records are reviewed and case discussed with Care Management/Social Worker. Management plans discussed with the patient, family and they are in agreement.  CODE STATUS: Full Code  TOTAL TIME TAKING CARE OF THIS PATIENT: 40 minutes.   More than 50% of the time was spent in counseling/coordination of care: YES  POSSIBLE D/C IN 2-3 DAYS, DEPENDING ON CLINICAL CONDITION.   Jinny BlossomKaty D Keiasia Christianson M.D on 04/12/2018 at 4:55 PM  Between 7am to 6pm - Pager  [phone]- [phone]  After 6pm go to [email or web address] - Social research officer, governmentpassword EPAS [facility]  [facility]  Office  [phone]  CC: Primary care physician; Barbette ReichmannHande, Vishwanath, MD  Note: This dictation was prepared with Dragon dictation along with smaller phrase technology. Any transcriptional errors that result from this process are unintentional.

## 2018-04-12 NOTE — Progress Notes (Signed)
Pharmacy Antibiotic Note  Haley RiggerJean S Hill is a 77 y.o. female admitted on 04/11/2018 with sepsis secondary to UTI. BCID now showing E. Coli.  Pharmacy has been consulted for meropenem dosing.  Plan: Start meropenem 500mg  IV every 12 hours based on current CrCl <4025ml/min.  Will monitor renal function and adjust dose as needed.  F/U on sensitivities and narrow therapy when possible   Height: 5\' 6"  (167.6 cm) Weight: 214 lb 8.1 oz (97.3 kg) IBW/kg (Calculated) : 59.3  Temp (24hrs), Avg:99 F (37.2 C), Min:97.6 F (36.4 C), Max:101.2 F (38.4 C)  Recent Labs  Lab 04/11/18 2211 04/12/18 0107  WBC 8.5  --   CREATININE 2.37*  --   LATICACIDVEN 3.2* 2.5*    Estimated Creatinine Clearance: 22.6 mL/min (A) (by C-G formula based on SCr of 2.37 mg/dL (H)).    Allergies  Allergen Reactions  . Ciprofloxacin Itching and Rash  . Fosamax [Alendronate Sodium] Other (See Comments)    Reaction: Ulcers in mouth and esophagus  . Iron Other (See Comments)    Antimicrobials this admission: Cefepime 8/19  >>  8/20 Meropenem 8/20>>  Microbiology results: 8/19 BCID: E.coli   8/19 UA: pending  Thank you for allowing pharmacy to be a part of this patient's care.  Gardner CandleSheema M Quandarius Nill, PharmD, BCPS Clinical Pharmacist 04/12/2018 11:19 AM

## 2018-04-12 NOTE — Progress Notes (Signed)
Pharmacy Antibiotic Note  Fredrik RiggerJean S Kober is a 77 y.o. female admitted on 04/11/2018 with UTI.  Pharmacy has been consulted for cefepime dosing.  Plan: Cefepime 2 grams in ED. 1 gram q 24 hours ordered as continuation.  Height: 5\' 2"  (157.5 cm) Weight: 210 lb 1.6 oz (95.3 kg) IBW/kg (Calculated) : 50.1  Temp (24hrs), Avg:100.8 F (38.2 C), Min:100.8 F (38.2 C), Max:100.8 F (38.2 C)  Recent Labs  Lab 04/11/18 2211  WBC 8.5  CREATININE 2.37*  LATICACIDVEN 3.2*    Estimated Creatinine Clearance: 20.7 mL/min (A) (by C-G formula based on SCr of 2.37 mg/dL (H)).    Allergies  Allergen Reactions  . Ciprofloxacin Itching and Rash  . Fosamax [Alendronate Sodium] Other (See Comments)    Reaction: Ulcers in mouth and esophagus  . Iron Other (See Comments)    Antimicrobials this admission: Cefepime 8/19  >>    >>   Dose adjustments this admission:   Microbiology results: 8/19 BCx: pending      8/19 UA: pending  Thank you for allowing pharmacy to be a part of this patient's care.  Dodie Parisi S 04/12/2018 12:14 AM

## 2018-04-12 NOTE — ED Notes (Signed)
Haley Hill (Daughter in law/pt lives with her)  [phone]

## 2018-04-12 NOTE — Consult Note (Addendum)
PULMONARY / CRITICAL CARE MEDICINE   Name: Haley Hill MRN: [id number] DOB: 1941/02/13    ADMISSION DATE:  04/11/2018 CONSULTATION DATE:  04/12/2018  REFERRING MD:  Dr. Marjie SkiffSridharan  CHIEF COMPLAINT:  AMS, UTI, Sepsis  HISTORY OF PRESENT ILLNESS:   Haley Hill is a 77 y.o. Female with a PMH as listed below, who presented to [facility] ED on 04/11/18 after a fall at home while going to the bathroom. Pt is lethargic and no family is present, therefore history is obtained from ED and nursing notes.  Pt's family reported she had some shivering, complained of chills, and was confused.  Pt was treated for UTI 6/30-7/03 (pan sensitive E.coli).  Upon arrival to ED, pt was febrile (100.8 F) and with AMS.  Initial workup in the ED revealed: Lactic acid 3.2, Cr. 2.37, UA positive for UTI, CXR with cardiomegaly and mild vascular congestion.  CT Head and CT cervical spine negative for any acute processes.  Pt received Cefepime and 2L NS boluses, with 3rd liter of NS infusing at 200 ml/hr.  Pt subsequently developed Acute Respiratory Distress following volume resuscitation, requiring BiPAP.  She is admitted to [facility] ICU for treatment of AMS, sepsis secondary to UTI, AKI on CKD III, and Acute Respiratory Distress requiring BiPAP.  PCCM is consulted for further management.  PAST MEDICAL HISTORY :  She  has a past medical history of Atrial fibrillation (HCC), Atrial flutter (HCC), CHF (congestive heart failure) (HCC), CKD (chronic kidney disease) stage 3, GFR 30-59 ml/min (HCC), Coronary disease, Dehydration, Diabetes (HCC), Fracture of humeral shaft, right, closed, Gout, Hypertension, Hypothyroidism, Peripheral neuropathy, Renal insufficiency, Right renal artery stenosis (HCC), Syncope and collapse, and Vitiligo.  PAST SURGICAL HISTORY: She  has a past surgical history that includes Breast biopsy (Right, 10/2000); Breast excisional biopsy (Bilateral, 1970's); Cardiac catheterization; Coronary artery bypass graft; Fracture  surgery; and Esophagogastroduodenoscopy (egd) with propofol (N/A, 06/10/2017).  Allergies  Allergen Reactions  . Ciprofloxacin Itching and Rash  . Fosamax [Alendronate Sodium] Other (See Comments)    Reaction: Ulcers in mouth and esophagus  . Iron Other (See Comments)    No current facility-administered medications on file prior to encounter.    Current Outpatient Medications on File Prior to Encounter  Medication Sig  . acetaminophen (TYLENOL) 500 MG tablet Take 500 mg by mouth 2 (two) times daily as needed. for pain/ increased temp. May be administered orally, per G-tube if needed or rectally if unable to swallow (separate order). Maximum dose for 24 hours is 3,000 mg from all sources of Acetaminophen/ Tylenol   . allopurinol (ZYLOPRIM) 300 MG tablet Take 1 tablet (300 mg total) by mouth daily.  . calcitRIOL (ROCALTROL) 0.25 MCG capsule Take 1 capsule (0.25 mcg total) by mouth 3 (three) times a week. Monday Wednesday Friday  . Cholecalciferol (VITAMIN D) 2000 UNITS CAPS Take 2,000 Units by mouth daily.  . Cyanocobalamin 5000 MCG LOZG Take 5,000 mcg by mouth daily.   . ferrous sulfate 325 (65 FE) MG tablet Take 325 mg by mouth daily with breakfast.  . furosemide (LASIX) 20 MG tablet Take 1 tablet (20 mg total) by mouth 2 (two) times daily.  Marland Kitchen. gabapentin (NEURONTIN) 800 MG tablet Take 1 tablet (800 mg total) by mouth every 8 (eight) hours.  Marland Kitchen. glipiZIDE (GLUCOTROL) 10 MG tablet Take 1 tablet (10 mg total) by mouth 2 (two) times daily before a meal.  . HUMULIN 70/30 KWIKPEN (70-30) 100 UNIT/ML PEN Inject 15 Units into the skin 2 (two)  times daily.  Marland Kitchen. HYDROcodone-acetaminophen (NORCO/VICODIN) 5-325 MG tablet Take 1 tablet by mouth 2 (two) times daily as needed for moderate pain.  . isosorbide mononitrate (ISMO,MONOKET) 20 MG tablet Take 1 tablet (20 mg total) by mouth daily.  Marland Kitchen. levothyroxine (SYNTHROID, LEVOTHROID) 112 MCG tablet Take 1 tablet (112 mcg total) by mouth daily before breakfast.   . metoprolol succinate (TOPROL-XL) 25 MG 24 hr tablet Take 1 tablet (25 mg total) by mouth daily.  Marland Kitchen. omeprazole (PRILOSEC) 20 MG capsule Take 1 capsule (20 mg total) by mouth 2 (two) times daily.  . potassium chloride SA (K-DUR,KLOR-CON) 20 MEQ tablet Take 1 tablet (20 mEq total) by mouth daily.  Marland Kitchen. rOPINIRole (REQUIP) 0.5 MG tablet Take 0.5 mg by mouth at bedtime.  . simvastatin (ZOCOR) 40 MG tablet Take 1 tablet (40 mg total) by mouth at bedtime.  Marland Kitchen. warfarin (COUMADIN) 1 MG tablet Take 1 tablet (1 mg total) by mouth daily. (Patient taking differently: Take 1-2 mg by mouth daily. Pt takes 2 mg Monday to Friday, 1 mg Saturday and Sunday.)  . calcitRIOL (ROCALTROL) 0.5 MCG capsule Take 1 capsule (0.5 mcg total) by mouth daily. (Patient not taking: Reported on 04/11/2018)  . diclofenac sodium (VOLTAREN) 1 % GEL Apply 2 g topically 4 (four) times daily. Apply to left foot (Patient not taking: Reported on 04/11/2018)  . ONE TOUCH ULTRA TEST test strip 1 each by Other route 4 (four) times daily.  Marland Kitchen. senna-docusate (SENOKOT-S) 8.6-50 MG per tablet Take 1 tablet by mouth at bedtime as needed for mild constipation. (Patient not taking: Reported on 04/11/2018)    FAMILY HISTORY:  Her family history includes Arthritis in her other; Gout in her other; Osteoporosis in her other; Stroke in her other.  SOCIAL HISTORY: She  reports that she has never smoked. She has never used smokeless tobacco. She reports that she does not drink alcohol or use drugs.  REVIEW OF SYSTEMS:   Unable to obtain due to AMS  SUBJECTIVE:  Unable to obtain due to AMS  VITAL SIGNS: BP (!) 98/51   Pulse 73   Temp 97.6 F (36.4 C) (Oral)   Resp (!) 23   Ht 5\' 2"  (1.575 m)   Wt 95.3 kg   SpO2 94%   BMI 38.43 kg/m   HEMODYNAMICS:    VENTILATOR SETTINGS:    INTAKE / OUTPUT: No intake/output data recorded.  PHYSICAL EXAMINATION: General:  Acutely ill appearing female, laying in bed, on BiPAP, in NAD Neuro:  Lethargic,  arouses to voice, follows commands, no focal deficits, Oriented to person and place HEENT:  Atraumatic, normocephalic, neck supple Cardiovascular:  Irregularly irregular rhythm, rate controlled, No M/R/G, 2+ pulses throughout Lungs:  Diminished, crackles bilaterally, even, nonlabored, BiPAP assisted Abdomen:  Soft, nontender, BS+ x4 Musculoskeletal:  No deformities, normal bulk and tone Skin:  No obvious rashes, lesions, or ulcerations  LABS:  BMET Recent Labs  Lab 04/11/18 2211  NA 138  K 4.3  CL 98  CO2 30  BUN 35*  CREATININE 2.37*  GLUCOSE 255*    Electrolytes Recent Labs  Lab 04/11/18 2211  CALCIUM 9.6    CBC Recent Labs  Lab 04/11/18 2211  WBC 8.5  HGB 14.2  HCT 41.2  PLT 72*    Coag's Recent Labs  Lab 04/11/18 0107  INR 2.16    Sepsis Markers Recent Labs  Lab 04/11/18 2211 04/12/18 0107  LATICACIDVEN 3.2* 2.5*    ABG No results for input(s): PHART, PCO2ART,  PO2ART in the last 168 hours.  Liver Enzymes Recent Labs  Lab 04/11/18 2211  AST 59*  ALT 26  ALKPHOS 110  BILITOT 1.9*  ALBUMIN 4.1    Cardiac Enzymes No results for input(s): TROPONINI, PROBNP in the last 168 hours.  Glucose No results for input(s): GLUCAP in the last 168 hours.  Imaging Ct Head Wo Contrast  Result Date: 04/11/2018 CLINICAL DATA:  Unwitnessed fall. Found on bathroom floor by family. Altered mental status. EXAM: CT HEAD WITHOUT CONTRAST CT CERVICAL SPINE WITHOUT CONTRAST TECHNIQUE: Multidetector CT imaging of the head and cervical spine was performed following the standard protocol without intravenous contrast. Multiplanar CT image reconstructions of the cervical spine were also generated. COMPARISON:  Head CT 02/20/2018 FINDINGS: CT HEAD FINDINGS Brain: Generalized atrophy unchanged from prior exam. Similar degree of chronic small vessel ischemia. No intracranial hemorrhage, mass effect, or midline shift. No hydrocephalus. The basilar cisterns are patent. No  evidence of territorial infarct or acute ischemia. No extra-axial or intracranial fluid collection. Minimal air in the region of the cavernous sinus, typically seen with IV catheter placement. Vascular: Atherosclerosis of skullbase vasculature without hyperdense vessel or abnormal calcification. Skull: No fracture or focal lesion. Sinuses/Orbits: No sinus fluid level or signs of inflammation. Orbits are unremarkable. Other: None. CT CERVICAL SPINE FINDINGS Alignment: Normal. Skull base and vertebrae: No acute fracture. Vertebral body heights are maintained. The dens and skull base are intact. Soft tissues and spinal canal: No prevertebral fluid or swelling. No visible canal hematoma. Disc levels: Mild diffuse endplate spurring. Disc space narrowing at C6-C7. Scattered facet arthropathy. Upper chest: Carotid calcifications. Other: None. IMPRESSION: 1. No acute intracranial abnormality. Unchanged atrophy and chronic small vessel ischemia. 2. Degenerative change in the cervical spine without acute fracture or subluxation. Electronically Signed   By: Rubye Oaks M.D.   On: 04/11/2018 23:08   Ct Cervical Spine Wo Contrast  Result Date: 04/11/2018 CLINICAL DATA:  Unwitnessed fall. Found on bathroom floor by family. Altered mental status. EXAM: CT HEAD WITHOUT CONTRAST CT CERVICAL SPINE WITHOUT CONTRAST TECHNIQUE: Multidetector CT imaging of the head and cervical spine was performed following the standard protocol without intravenous contrast. Multiplanar CT image reconstructions of the cervical spine were also generated. COMPARISON:  Head CT 02/20/2018 FINDINGS: CT HEAD FINDINGS Brain: Generalized atrophy unchanged from prior exam. Similar degree of chronic small vessel ischemia. No intracranial hemorrhage, mass effect, or midline shift. No hydrocephalus. The basilar cisterns are patent. No evidence of territorial infarct or acute ischemia. No extra-axial or intracranial fluid collection. Minimal air in the  region of the cavernous sinus, typically seen with IV catheter placement. Vascular: Atherosclerosis of skullbase vasculature without hyperdense vessel or abnormal calcification. Skull: No fracture or focal lesion. Sinuses/Orbits: No sinus fluid level or signs of inflammation. Orbits are unremarkable. Other: None. CT CERVICAL SPINE FINDINGS Alignment: Normal. Skull base and vertebrae: No acute fracture. Vertebral body heights are maintained. The dens and skull base are intact. Soft tissues and spinal canal: No prevertebral fluid or swelling. No visible canal hematoma. Disc levels: Mild diffuse endplate spurring. Disc space narrowing at C6-C7. Scattered facet arthropathy. Upper chest: Carotid calcifications. Other: None. IMPRESSION: 1. No acute intracranial abnormality. Unchanged atrophy and chronic small vessel ischemia. 2. Degenerative change in the cervical spine without acute fracture or subluxation. Electronically Signed   By: Rubye Oaks M.D.   On: 04/11/2018 23:08   Dg Chest Portable 1 View  Result Date: 04/11/2018 CLINICAL DATA:  Altered mental status EXAM: PORTABLE  CHEST 1 VIEW COMPARISON:  02/20/2018 FINDINGS: Post sternotomy changes. Mild cardiomegaly with central vascular congestion. Aortic atherosclerosis. No focal opacity or pleural effusion. No pneumothorax. IMPRESSION: Cardiomegaly with mild central vascular congestion. Electronically Signed   By: Jasmine Pang M.D.   On: 04/11/2018 22:48     STUDIES:  CT Head 04/11/18>> No acute intracranial abnormality. Unchanged atrophy and chronic small vessel ischemia. CT Cervical Spine 04/11/18>> Degenerative change in the cervical spine without acute fracture or subluxation.  CULTURES: Blood x2 04/12/18>> Urine 04/12/18>>  ANTIBIOTICS: Cefepime 04/12/18>>  SIGNIFICANT EVENTS: 04/12/18>>Admitted to [facility] Stepdown  LINES/TUBES:  DISCUSSION: 77 y.o. Female with AMS, Sepsis secondary to UTI, AKI on CKD III, and Acute Respiratory distress  following volume resuscitation requiring BiPAP  ASSESSMENT / PLAN:  PULMONARY A: Acute Respiratory distress following volume resuscitation P:   Supplemental O2 to maintain O2 sats>92% BiPAP Follow intermittent CXR Will give 20 mg Lasix IV x1 dose D/C IVF  CARDIOVASCULAR A:  Sepsis Lactic Acidosis Chronic A-fib Hx: HTN, CAD P:  Cardiac monitoring Maintain MAP>65 Levophed if needed to maintain MAP goal Trend Lactic acid Warfarin for anticoagulation (A-fib)  RENAL A:   AKI on CKD III (baseline Cr. 1.7-2.0) P:   Monitor I&O's / urinary output Follow BMP Ensure adequate renal perfusion Avoid nephrotoxic agents as able Replace electrolytes as indicated Obtain Renal Ultrasound   GASTROINTESTINAL A:   Mildly elevatedTransaminases & Hyperbilirubinemia P:   Diet as tolerated Continue Protonix Trend LFT's  HEMATOLOGIC A:   Thrombcytopenia P:  Monitor for s/sx of bleeding Trend CBC Warfarin for VTE prophylaxis/anticoagulation for A-fib Transfuse PRBCs for Hgb<7 Transfuse PLT for PLT count <50 & Bleeding   INFECTIOUS A:   UTI P:   Monitor fever curve Trend WBC & PCT Follow cultures Continue Cefepime  ENDOCRINE A:   DM II  Hypothyroidism P:   CBG's SSI Follow ICU hypo/hyperglycemia protocol Continue Synthroid  NEUROLOGIC A:   Acute encephalopathy in setting of UTI P:   Provide supportive care Avoid sedating meds as able Lights on during the day Promote normal wake/sleep cycle   FAMILY  - Updates: No family present at bedside during NP rounds 04/12/18.  - Inter-disciplinary family meet or Palliative Care meeting due by:  04/19/18    Harlon Ditty, AGACNP-BC [place] [facility] Pager: [phone]   04/12/2018, 4:54 AM

## 2018-04-12 NOTE — H&P (Signed)
[facility] - [place] [facility]   PATIENT NAME: Haley Hill    MR#:  [id number]  DATE OF BIRTH:  July 19, 1941  DATE OF ADMISSION:  04/11/2018  PRIMARY CARE PHYSICIAN: Barbette ReichmannHande, Vishwanath, MD   REQUESTING/REFERRING PHYSICIAN: Rebecka ApleyWebster, Allison P, MD  CHIEF COMPLAINT:   Chief Complaint  Patient presents with  . Fall    HISTORY OF PRESENT ILLNESS:  Haley Hill  is a 77 y.o. female with a known history of T2IDDM, HTN, CKD III, Afib (Coumadin), recurrent UTI p/w generalized weakness, chills, confusion, fall. Pt on BiPAP at the time of my assessment, and cannot provide Hx/ROS, but is awake/alert. Family (at bedside) provides Hx. They tell me pt has suspected early dementia, and is yet to be evaluated by a Neurologist. She was admitted from 06/30 to 07/03 w/ UTI, UCx (+) pan-sensitive E. Coli, Tx w/ Ceftriaxone and Keflex. She was D/Ced to rehab, where she stayed for ~2wks. She went home w/ family, but they state she still had some generalized weakness. She was in a reasonably normal state of health until 08/19. @~1930-2000, pt was sitting in recliner, and family noticed pt was shivering. She c/o chills. She needed to go to the bathroom, but was too weak to get out of the recliner. She managed to get up after 4-5 tries. She was confused, and was having difficulty remember which direction to walk in order to get to the bathroom. She urinated, and then managed to get up off the toilet, but fell before she could walk out of the bathroom into the hall. Fall was unwitnessed. (-) LOC. Pt did not complain of significant pain. EMS was called. Febrile and tachycardic in ED, received bolus IVF. Developed tachypnea and respiratory distress, started on BiPAP. Is breathing comfortably on BiPAP, and is not in distress at the time of my assessment.  PAST MEDICAL HISTORY:   Past Medical History:  Diagnosis Date  . Atrial fibrillation (HCC)   . Atrial flutter (HCC)   . CHF (congestive heart  failure) (HCC)   . CKD (chronic kidney disease) stage 3, GFR 30-59 ml/min (HCC)   . Coronary disease   . Dehydration   . Diabetes (HCC)   . Fracture of humeral shaft, right, closed   . Gout   . Hypertension   . Hypothyroidism   . Peripheral neuropathy   . Renal insufficiency   . Right renal artery stenosis (HCC)   . Syncope and collapse   . Vitiligo     PAST SURGICAL HISTORY:   Past Surgical History:  Procedure Laterality Date  . BREAST BIOPSY Right 10/2000   negative for cancer  . BREAST EXCISIONAL BIOPSY Bilateral 1970's   neg  . CARDIAC CATHETERIZATION    . CORONARY ARTERY BYPASS GRAFT    . ESOPHAGOGASTRODUODENOSCOPY (EGD) WITH PROPOFOL N/A 06/10/2017   Procedure: ESOPHAGOGASTRODUODENOSCOPY (EGD) WITH PROPOFOL;  Surgeon: Christena DeemSkulskie, Martin U, MD;  [facility];  Service: Endoscopy;  Laterality: N/A;  . FRACTURE SURGERY      SOCIAL HISTORY:   Social History   Tobacco Use  . Smoking status: Never Smoker  . Smokeless tobacco: Never Used  Substance Use Topics  . Alcohol use: No    FAMILY HISTORY:   Family History  Problem Relation Age of Onset  . Gout Other   . Stroke Other   . Arthritis Other   . Osteoporosis Other     DRUG ALLERGIES:   Allergies  Allergen Reactions  . Ciprofloxacin Itching and  Rash  . Fosamax [Alendronate Sodium] Other (See Comments)    Reaction: Ulcers in mouth and esophagus  . Iron Other (See Comments)    REVIEW OF SYSTEMS:   Review of Systems  Unable to perform ROS: Mental status change  Constitutional: Positive for chills and malaise/fatigue.  Respiratory: Positive for shortness of breath.   Musculoskeletal: Positive for falls.  Neurological: Positive for weakness. Negative for loss of consciousness.   AMS, respiratory distress, BiPAP. MEDICATIONS AT HOME:   Prior to Admission medications   Medication Sig Start Date End Date Taking? Authorizing Provider  acetaminophen (TYLENOL) 500 MG tablet Take 500 mg by  mouth 2 (two) times daily as needed. for pain/ increased temp. May be administered orally, per G-tube if needed or rectally if unable to swallow (separate order). Maximum dose for 24 hours is 3,000 mg from all sources of Acetaminophen/ Tylenol    Yes [provider]  allopurinol (ZYLOPRIM) 300 MG tablet Take 1 tablet (300 mg total) by mouth daily. 03/08/18  Yes Medina-Vargas, Monina C, NP  calcitRIOL (ROCALTROL) 0.25 MCG capsule Take 1 capsule (0.25 mcg total) by mouth 3 (three) times a week. Monday Wednesday Friday 03/09/18  Yes Medina-Vargas, Monina C, NP  Cholecalciferol (VITAMIN D) 2000 UNITS CAPS Take 2,000 Units by mouth daily.   Yes [provider]  Cyanocobalamin 5000 MCG LOZG Take 5,000 mcg by mouth daily.    Yes [provider]  ferrous sulfate 325 (65 FE) MG tablet Take 325 mg by mouth daily with breakfast.   Yes [provider]  furosemide (LASIX) 20 MG tablet Take 1 tablet (20 mg total) by mouth 2 (two) times daily. 03/08/18  Yes Medina-Vargas, Monina C, NP  gabapentin (NEURONTIN) 800 MG tablet Take 1 tablet (800 mg total) by mouth every 8 (eight) hours. 03/08/18  Yes Medina-Vargas, Monina C, NP  glipiZIDE (GLUCOTROL) 10 MG tablet Take 1 tablet (10 mg total) by mouth 2 (two) times daily before a meal. 03/08/18  Yes Medina-Vargas, Monina C, NP  HUMULIN 70/30 KWIKPEN (70-30) 100 UNIT/ML PEN Inject 15 Units into the skin 2 (two) times daily. 03/08/18  Yes Medina-Vargas, Monina C, NP  HYDROcodone-acetaminophen (NORCO/VICODIN) 5-325 MG tablet Take 1 tablet by mouth 2 (two) times daily as needed for moderate pain.   Yes [provider]  isosorbide mononitrate (ISMO,MONOKET) 20 MG tablet Take 1 tablet (20 mg total) by mouth daily. 03/08/18  Yes Medina-Vargas, Monina C, NP  levothyroxine (SYNTHROID, LEVOTHROID) 112 MCG tablet Take 1 tablet (112 mcg total) by mouth daily before breakfast. 03/08/18  Yes Medina-Vargas, Monina C, NP  metoprolol succinate (TOPROL-XL)  25 MG 24 hr tablet Take 1 tablet (25 mg total) by mouth daily. 03/08/18  Yes Medina-Vargas, Monina C, NP  omeprazole (PRILOSEC) 20 MG capsule Take 1 capsule (20 mg total) by mouth 2 (two) times daily. 03/08/18  Yes Medina-Vargas, Monina C, NP  potassium chloride SA (K-DUR,KLOR-CON) 20 MEQ tablet Take 1 tablet (20 mEq total) by mouth daily. 03/08/18  Yes Medina-Vargas, Monina C, NP  rOPINIRole (REQUIP) 0.5 MG tablet Take 0.5 mg by mouth at bedtime.   Yes [provider]  simvastatin (ZOCOR) 40 MG tablet Take 1 tablet (40 mg total) by mouth at bedtime. 03/08/18  Yes Medina-Vargas, Monina C, NP  warfarin (COUMADIN) 1 MG tablet Take 1 tablet (1 mg total) by mouth daily. Patient taking differently: Take 1-2 mg by mouth daily. Pt takes 2 mg Monday to Friday, 1 mg Saturday and Sunday. 03/08/18  Yes Medina-Vargas, Monina C, NP  calcitRIOL (ROCALTROL) 0.5 MCG capsule Take 1 capsule (0.5 mcg total) by mouth daily. Patient not taking: Reported on 04/11/2018 03/08/18   Medina-Vargas, Monina C, NP  diclofenac sodium (VOLTAREN) 1 % GEL Apply 2 g topically 4 (four) times daily. Apply to left foot Patient not taking: Reported on 04/11/2018 03/08/18   Medina-Vargas, Monina C, NP  ONE TOUCH ULTRA TEST test strip 1 each by Other route 4 (four) times daily. 03/08/18   Medina-Vargas, Monina C, NP  senna-docusate (SENOKOT-S) 8.6-50 MG per tablet Take 1 tablet by mouth at bedtime as needed for mild constipation. Patient not taking: Reported on 04/11/2018 05/02/15   Barbette Reichmann, MD      VITAL SIGNS:  Blood pressure (!) 164/61, pulse 97, temperature (!) 101.2 F (38.4 C), resp. rate (!) 32, height 5\' 2"  (1.575 m), weight 95.3 kg, SpO2 94 %.  PHYSICAL EXAMINATION:  Physical Exam  Constitutional: She appears well-developed and well-nourished. She is sleeping. She is easily aroused.  Non-toxic appearance. She does not have a sickly appearance. No distress. She is not intubated. Face mask in place.  HENT:  Head:  Normocephalic and atraumatic.  Mouth/Throat: Oropharynx is clear and moist. No oropharyngeal exudate.  Eyes: Conjunctivae, EOM and lids are normal. No scleral icterus.  Neck: Neck supple. No JVD present. No thyromegaly present.  Cardiovascular: Normal rate, S1 normal, S2 normal and normal heart sounds. An irregularly irregular rhythm present. Exam reveals no gallop, no S3, no S4, no distant heart sounds and no friction rub.  No murmur heard. Pulmonary/Chest: Effort normal. No accessory muscle usage or stridor. Tachypnea noted. No apnea and no bradypnea. She is not intubated. No respiratory distress. She has decreased breath sounds in the right upper field, the right middle field, the right lower field, the left upper field, the left middle field and the left lower field. She has no wheezes. She has no rhonchi. She has no rales.  Abdominal: Soft. She exhibits no distension. There is no tenderness. There is no rigidity, no rebound and no guarding.  Lymphadenopathy:    She has no cervical adenopathy.  Neurological: She is alert and easily aroused.  Skin: Skin is warm and dry. No rash noted. She is not diaphoretic. No erythema.  B/L LE chronic venous stasis skin changes.  Psychiatric:  On BiPAP. Easily arousable, alert.   Sleeping, but easily arousable, alert thereafter. BiPAP. LABORATORY PANEL:   CBC Recent Labs  Lab 04/11/18 2211  WBC 8.5  HGB 14.2  HCT 41.2  PLT 72*   ------------------------------------------------------------------------------------------------------------------  Chemistries  Recent Labs  Lab 04/11/18 2211  NA 138  K 4.3  CL 98  CO2 30  GLUCOSE 255*  BUN 35*  CREATININE 2.37*  CALCIUM 9.6  AST 59*  ALT 26  ALKPHOS 110  BILITOT 1.9*   ------------------------------------------------------------------------------------------------------------------  Cardiac Enzymes No results for input(s): TROPONINI in the last 168  hours. ------------------------------------------------------------------------------------------------------------------  RADIOLOGY:  Ct Head Wo Contrast  Result Date: 04/11/2018 CLINICAL DATA:  Unwitnessed fall. Found on bathroom floor by family. Altered mental status. EXAM: CT HEAD WITHOUT CONTRAST CT CERVICAL SPINE WITHOUT CONTRAST TECHNIQUE: Multidetector CT imaging of the head and cervical spine was performed following the standard protocol without intravenous contrast. Multiplanar CT image reconstructions of the cervical spine were also generated. COMPARISON:  Head CT 02/20/2018 FINDINGS: CT HEAD FINDINGS Brain: Generalized atrophy unchanged from prior exam. Similar degree of chronic small vessel ischemia. No intracranial hemorrhage, mass effect, or midline shift.  No hydrocephalus. The basilar cisterns are patent. No evidence of territorial infarct or acute ischemia. No extra-axial or intracranial fluid collection. Minimal air in the region of the cavernous sinus, typically seen with IV catheter placement. Vascular: Atherosclerosis of skullbase vasculature without hyperdense vessel or abnormal calcification. Skull: No fracture or focal lesion. Sinuses/Orbits: No sinus fluid level or signs of inflammation. Orbits are unremarkable. Other: None. CT CERVICAL SPINE FINDINGS Alignment: Normal. Skull base and vertebrae: No acute fracture. Vertebral body heights are maintained. The dens and skull base are intact. Soft tissues and spinal canal: No prevertebral fluid or swelling. No visible canal hematoma. Disc levels: Mild diffuse endplate spurring. Disc space narrowing at C6-C7. Scattered facet arthropathy. Upper chest: Carotid calcifications. Other: None. IMPRESSION: 1. No acute intracranial abnormality. Unchanged atrophy and chronic small vessel ischemia. 2. Degenerative change in the cervical spine without acute fracture or subluxation. Electronically Signed   By: Rubye Oaks M.D.   On: 04/11/2018 23:08    Ct Cervical Spine Wo Contrast  Result Date: 04/11/2018 CLINICAL DATA:  Unwitnessed fall. Found on bathroom floor by family. Altered mental status. EXAM: CT HEAD WITHOUT CONTRAST CT CERVICAL SPINE WITHOUT CONTRAST TECHNIQUE: Multidetector CT imaging of the head and cervical spine was performed following the standard protocol without intravenous contrast. Multiplanar CT image reconstructions of the cervical spine were also generated. COMPARISON:  Head CT 02/20/2018 FINDINGS: CT HEAD FINDINGS Brain: Generalized atrophy unchanged from prior exam. Similar degree of chronic small vessel ischemia. No intracranial hemorrhage, mass effect, or midline shift. No hydrocephalus. The basilar cisterns are patent. No evidence of territorial infarct or acute ischemia. No extra-axial or intracranial fluid collection. Minimal air in the region of the cavernous sinus, typically seen with IV catheter placement. Vascular: Atherosclerosis of skullbase vasculature without hyperdense vessel or abnormal calcification. Skull: No fracture or focal lesion. Sinuses/Orbits: No sinus fluid level or signs of inflammation. Orbits are unremarkable. Other: None. CT CERVICAL SPINE FINDINGS Alignment: Normal. Skull base and vertebrae: No acute fracture. Vertebral body heights are maintained. The dens and skull base are intact. Soft tissues and spinal canal: No prevertebral fluid or swelling. No visible canal hematoma. Disc levels: Mild diffuse endplate spurring. Disc space narrowing at C6-C7. Scattered facet arthropathy. Upper chest: Carotid calcifications. Other: None. IMPRESSION: 1. No acute intracranial abnormality. Unchanged atrophy and chronic small vessel ischemia. 2. Degenerative change in the cervical spine without acute fracture or subluxation. Electronically Signed   By: Rubye Oaks M.D.   On: 04/11/2018 23:08   Dg Chest Portable 1 View  Result Date: 04/11/2018 CLINICAL DATA:  Altered mental status EXAM: PORTABLE CHEST 1 VIEW  COMPARISON:  02/20/2018 FINDINGS: Post sternotomy changes. Mild cardiomegaly with central vascular congestion. Aortic atherosclerosis. No focal opacity or pleural effusion. No pneumothorax. IMPRESSION: Cardiomegaly with mild central vascular congestion. Electronically Signed   By: Jasmine Pang M.D.   On: 04/11/2018 22:48   IMPRESSION AND PLAN:   A/P: 58F w/ early dementia p/w sepsis 2/2 UTI. Hyperglycemia (w/ T2IDDM), AKI + CKD III, mild transaminasemia, mild hyperbilirubinemia, macrocytosis w/o anemia, thrmobocytopenia -Sepsis, UTI: U/A (+) Hgb, leuk est, protein, bacteria, RBC, WBC. (+) F, tachycardia, (+) SIRS, (+) sepsis. Lactate 3.2. (-) hypotension. Tach improving; Tele, cardiac monitoring. IVF, rpt Lac. PCT, BCx, UCx. Started on Cefepime in ED, cont'd. Had recent admit for UTI (06/30-07/03), Tx w/ Ceftriaxone and Keflex, Cx pan sensitive E. Coli. -Hyperglycemia/T2IDDM: SSI. C/w 70/30. -AKI + CKD III: Baseline Cr 1.7-2.0 based on review of prior labwork, baseline CKD III (  2/2 DM, HTN, aged kidney). Cr 2.37 on present admission (increased from 1.71 on 07/02). Increase in 0.3 above baseline, (+) AKI, likely prerenal, suspect volume depletion. Rcd IVF in ED. EF 50-55%. Monitor BMP, avoid nephrotoxins. -Transaminasemia/hyperbilirubinemia: Mild, AST 59, < 2x ULN. TBili 1.9. Likely 2/2 sepsis, volume depletion. C/w Statin. 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 Both issues appear to be chronic since 2016. -c/w other home meds/formularly subs. -FEN/GI: Renal diabetic diet. -DVT PPx: Coumadin. -Code status: Full code. -Disposition: Admission, > 2 midnights.   All the records are reviewed and case discussed with ED provider. Management plans discussed with the patient, family and they are in agreement.  CODE STATUS: Full code.  TOTAL TIME TAKING CARE OF THIS PATIENT: 75 minutes.    Barbaraann RondoPrasanna Verdia Bolt M.D on 04/12/2018 at 1:43 AM  Between 7am to 6pm - Pager -  [phone]  After 6pm go to [email or web address] - Social research officer, governmentpassword EPAS [facility]  [facility] [place] [facility]  Office  [phone]  CC: Primary care physician; Barbette ReichmannHande, Vishwanath, MD   Note: This dictation was prepared with Dragon dictation along with smaller phrase technology. Any transcriptional errors that result from this process are unintentional.

## 2018-04-12 NOTE — ED Notes (Signed)
Called respiratory to place pt back on bipap per Dr. Leone PayorWebster's orders.

## 2018-04-12 NOTE — ED Provider Notes (Signed)
[facility] Emergency Department Provider Note   ____________________________________________   First MD Initiated Contact with Patient 04/11/18 2302     (approximate)  I have reviewed the triage vital signs and the nursing notes.   HISTORY  Chief Complaint Fall  History is obtained from patient's family due to her altered mental status.  HPI Haley Hill is a 77 y.o. female who comes into the hospital today after a fall tonight.  The patient was going to the bathroom and fell.  She seems confused with some altered mental status.  This is not her baseline.  The patient has a history of UTIs and A. fib.  The patient was treated for UTI at the end of June.  The family states that around 8 PM she was sitting in her recliner.  They thought she was asleep but she started having some shakes.  She stated she was cold and then tried to get up.  They state that it took a long time for her to get up but she went to the bathroom.  The family member sat around the toilet but then she heard a loud noise and look like the patient fell in the bathroom.  She was on her back so you are unsure if she hit her head.  She also was talking and not seeming to make sense.  They wanted to check her temperature but was unable to do so before the fall.  The family states that they are concerned because she is been admitted with sepsis in the past.  She is here today for evaluation.   Past Medical History:  Diagnosis Date  . Atrial fibrillation (HCC)   . Atrial flutter (HCC)   . CHF (congestive heart failure) (HCC)   . CKD (chronic kidney disease) stage 3, GFR 30-59 ml/min (HCC)   . Coronary disease   . Dehydration   . Diabetes (HCC)   . Fracture of humeral shaft, right, closed   . Gout   . Hypertension   . Hypothyroidism   . Peripheral neuropathy   . Renal insufficiency   . Right renal artery stenosis (HCC)   . Syncope and collapse   . Vitiligo     Patient Active Problem List    Diagnosis Date Noted  . Sepsis secondary to UTI (HCC) 04/12/2018  . UTI (urinary tract infection) 02/20/2018  . Hypoxia 04/29/2015  . Chronic kidney disease (CKD), stage III (moderate) (HCC) 03/07/2015  . ARF (acute renal failure) (HCC) 01/25/2015  . Fracture of humeral shaft, right, closed 01/25/2015  . Acute renal failure (HCC) 01/25/2015  . Closed fracture of humerus, shaft 01/25/2015  . Congestive heart failure (HCC) 09/25/2014  . AI (aortic incompetence) 02/07/2014  . Edema leg 02/07/2014  . History of cardiac catheterization 02/07/2014  . Renal artery stenosis (HCC) 02/07/2014  . H/O coronary artery bypass surgery 02/07/2014  . Atrial fibrillation (HCC) 01/04/2014  . Arteriosclerosis of coronary artery 01/04/2014  . Type 2 diabetes mellitus (HCC) 01/04/2014  . BP (high blood pressure) 01/04/2014  . HLD (hyperlipidemia) 01/04/2014  . Adult hypothyroidism 01/04/2014  . Neuropathy 01/04/2014    Past Surgical History:  Procedure Laterality Date  . BREAST BIOPSY Right 10/2000   negative for cancer  . BREAST EXCISIONAL BIOPSY Bilateral 1970's   neg  . CARDIAC CATHETERIZATION    . CORONARY ARTERY BYPASS GRAFT    . ESOPHAGOGASTRODUODENOSCOPY (EGD) WITH PROPOFOL N/A 06/10/2017   Procedure: ESOPHAGOGASTRODUODENOSCOPY (EGD) WITH PROPOFOL;  Surgeon: Marva Panda,  Cindra Eves, MD;  Location: [facility] ENDOSCOPY;  Service: Endoscopy;  Laterality: N/A;  . FRACTURE SURGERY      Prior to Admission medications   Medication Sig Start Date End Date Taking? Authorizing Provider  acetaminophen (TYLENOL) 500 MG tablet Take 500 mg by mouth 2 (two) times daily as needed. for pain/ increased temp. May be administered orally, per G-tube if needed or rectally if unable to swallow (separate order). Maximum dose for 24 hours is 3,000 mg from all sources of Acetaminophen/ Tylenol    Yes [provider]  allopurinol (ZYLOPRIM) 300 MG tablet Take 1 tablet (300 mg total) by mouth daily. 03/08/18  Yes  Medina-Vargas, Monina C, NP  calcitRIOL (ROCALTROL) 0.25 MCG capsule Take 1 capsule (0.25 mcg total) by mouth 3 (three) times a week. Monday Wednesday Friday 03/09/18  Yes Medina-Vargas, Monina C, NP  Cholecalciferol (VITAMIN D) 2000 UNITS CAPS Take 2,000 Units by mouth daily.   Yes [provider]  Cyanocobalamin 5000 MCG LOZG Take 5,000 mcg by mouth daily.    Yes [provider]  ferrous sulfate 325 (65 FE) MG tablet Take 325 mg by mouth daily with breakfast.   Yes [provider]  furosemide (LASIX) 20 MG tablet Take 1 tablet (20 mg total) by mouth 2 (two) times daily. 03/08/18  Yes Medina-Vargas, Monina C, NP  gabapentin (NEURONTIN) 800 MG tablet Take 1 tablet (800 mg total) by mouth every 8 (eight) hours. 03/08/18  Yes Medina-Vargas, Monina C, NP  glipiZIDE (GLUCOTROL) 10 MG tablet Take 1 tablet (10 mg total) by mouth 2 (two) times daily before a meal. 03/08/18  Yes Medina-Vargas, Monina C, NP  HUMULIN 70/30 KWIKPEN (70-30) 100 UNIT/ML PEN Inject 15 Units into the skin 2 (two) times daily. 03/08/18  Yes Medina-Vargas, Monina C, NP  HYDROcodone-acetaminophen (NORCO/VICODIN) 5-325 MG tablet Take 1 tablet by mouth 2 (two) times daily as needed for moderate pain.   Yes [provider]  isosorbide mononitrate (ISMO,MONOKET) 20 MG tablet Take 1 tablet (20 mg total) by mouth daily. 03/08/18  Yes Medina-Vargas, Monina C, NP  levothyroxine (SYNTHROID, LEVOTHROID) 112 MCG tablet Take 1 tablet (112 mcg total) by mouth daily before breakfast. 03/08/18  Yes Medina-Vargas, Monina C, NP  metoprolol succinate (TOPROL-XL) 25 MG 24 hr tablet Take 1 tablet (25 mg total) by mouth daily. 03/08/18  Yes Medina-Vargas, Monina C, NP  omeprazole (PRILOSEC) 20 MG capsule Take 1 capsule (20 mg total) by mouth 2 (two) times daily. 03/08/18  Yes Medina-Vargas, Monina C, NP  potassium chloride SA (K-DUR,KLOR-CON) 20 MEQ tablet Take 1 tablet (20 mEq total) by mouth daily. 03/08/18  Yes Medina-Vargas,  Monina C, NP  rOPINIRole (REQUIP) 0.5 MG tablet Take 0.5 mg by mouth at bedtime.   Yes [provider]  simvastatin (ZOCOR) 40 MG tablet Take 1 tablet (40 mg total) by mouth at bedtime. 03/08/18  Yes Medina-Vargas, Monina C, NP  warfarin (COUMADIN) 1 MG tablet Take 1 tablet (1 mg total) by mouth daily. Patient taking differently: Take 1-2 mg by mouth daily. Pt takes 2 mg Monday to Friday, 1 mg Saturday and Sunday. 03/08/18  Yes Medina-Vargas, Monina C, NP  calcitRIOL (ROCALTROL) 0.5 MCG capsule Take 1 capsule (0.5 mcg total) by mouth daily. Patient not taking: Reported on 04/11/2018 03/08/18   Medina-Vargas, Monina C, NP  diclofenac sodium (VOLTAREN) 1 % GEL Apply 2 g topically 4 (four) times daily. Apply to left foot Patient not taking: Reported on 04/11/2018 03/08/18   Medina-Vargas,  Monina C, NP  ONE TOUCH ULTRA TEST test strip 1 each by Other route 4 (four) times daily. 03/08/18   Medina-Vargas, Monina C, NP  senna-docusate (SENOKOT-S) 8.6-50 MG per tablet Take 1 tablet by mouth at bedtime as needed for mild constipation. Patient not taking: Reported on 04/11/2018 05/02/15   Barbette ReichmannHande, Vishwanath, MD    Allergies Ciprofloxacin; Fosamax [alendronate sodium]; and Iron  Family History  Problem Relation Age of Onset  . Gout Other   . Stroke Other   . Arthritis Other   . Osteoporosis Other     Social History Social History   Tobacco Use  . Smoking status: Never Smoker  . Smokeless tobacco: Never Used  Substance Use Topics  . Alcohol use: No  . Drug use: No    Review of Systems  Constitutional:  fever/chills Eyes: No visual changes. ENT: No sore throat. Cardiovascular: Denies chest pain. Respiratory: Denies shortness of breath. Gastrointestinal: No abdominal pain.  No nausea, no vomiting.   Genitourinary: cloudy looking urine Musculoskeletal: Negative for back pain. Skin: Negative for rash. Neurological: Negative for headaches Psych: Altered mental  status  ____________________________________________   PHYSICAL EXAM:  VITAL SIGNS: ED Triage Vitals  Enc Vitals Group     BP 04/11/18 2205 (!) 148/64     Pulse Rate 04/11/18 2205 (!) 111     Resp 04/11/18 2205 20     Temp 04/11/18 2205 (!) 100.8 F (38.2 C)     Temp Source 04/11/18 2205 Oral     SpO2 04/11/18 2205 93 %     Weight 04/11/18 2206 210 lb 1.6 oz (95.3 kg)     Height 04/11/18 2206 5\' 2"  (1.575 m)     Head Circumference --      Peak Flow --      Pain Score --      Pain Loc --      Pain Edu? --      Excl. in GC? --     Constitutional: Alert but confused. Well appearing and in moderate distress. Eyes: Conjunctivae are normal. PERRL. EOMI. Head: Atraumatic. Nose: No congestion/rhinnorhea. Mouth/Throat: Mucous membranes are moist.  Oropharynx non-erythematous. Cardiovascular: Tachycardia, irregular rhythm. Grossly normal heart sounds.  Good peripheral circulation. Respiratory: Normal respiratory effort.  No retractions. Lungs CTAB. Gastrointestinal: Soft and nontender. No distention. Positive bowel sounds Musculoskeletal: No lower extremity tenderness nor edema.   Neurologic:  Normal speech but confused language.  Skin:  Skin is warm, dry and intact.  Psychiatric: Patient with some agitation..   ____________________________________________   LABS (all labs ordered are listed, but only abnormal results are displayed)  Labs Reviewed  LACTIC ACID, PLASMA - Abnormal; Notable for the following components:      Result Value   Lactic Acid, Venous 3.2 (*)    All other components within normal limits  CBC WITH DIFFERENTIAL/PLATELET - Abnormal; Notable for the following components:   MCV 105.2 (*)    MCH 36.4 (*)    RDW 15.0 (*)    Platelets 72 (*)    Neutro Abs 7.4 (*)    Lymphs Abs 0.6 (*)    All other components within normal limits  COMPREHENSIVE METABOLIC PANEL - Abnormal; Notable for the following components:   Glucose, Bld 255 (*)    BUN 35 (*)     Creatinine, Ser 2.37 (*)    AST 59 (*)    Total Bilirubin 1.9 (*)    GFR calc non Af Amer 18 (*)  GFR calc Af Amer 21 (*)    All other components within normal limits  URINALYSIS, COMPLETE (UACMP) WITH MICROSCOPIC - Abnormal; Notable for the following components:   Color, Urine YELLOW (*)    APPearance HAZY (*)    Glucose, UA >=500 (*)    Hgb urine dipstick MODERATE (*)    Protein, ur 100 (*)    Leukocytes, UA TRACE (*)    Bacteria, UA MANY (*)    All other components within normal limits  CULTURE, BLOOD (ROUTINE X 2)  CULTURE, BLOOD (ROUTINE X 2)  URINE CULTURE  LACTIC ACID, PLASMA  PROTIME-INR   ____________________________________________  EKG  ED ECG REPORT #1 I, Rebecka ApleyWebster,  Kelsen Celona P, the attending physician, personally viewed and interpreted this ECG.   Date: 04/11/2018  EKG Time: 2205  Rate: 101  Rhythm: atrial fibrillation, rate 101  Axis: normal  Intervals:none  ST&T Change: none, lots of artifact present  ED ECG REPORT #2 I, Rebecka ApleyWebster,  Romulus Hanrahan P, the attending physician, personally viewed and interpreted this ECG.   Date: 04/12/2018  EKG Time: 0013  Rate: 116  Rhythm: atrial fibrillation, rate 116  Axis: normal  Intervals:none  ST&T Change: Flipped T waves in lead III and aVF, ST depression in lead V4, V5, V6.   ____________________________________________  RADIOLOGY  ED MD interpretation:  CT head and cervical spine: No acute intracranial abnormality, unchanged atrophy and chronic small vessel ischemia.  Degenerative change in the cervical spine without acute fracture or subluxation.  Chest x-ray: Cardiomegaly with mild central vascular congestion  Official radiology report(s): Ct Head Wo Contrast  Result Date: 04/11/2018 CLINICAL DATA:  Unwitnessed fall. Found on bathroom floor by family. Altered mental status. EXAM: CT HEAD WITHOUT CONTRAST CT CERVICAL SPINE WITHOUT CONTRAST TECHNIQUE: Multidetector CT imaging of the head and cervical spine was  performed following the standard protocol without intravenous contrast. Multiplanar CT image reconstructions of the cervical spine were also generated. COMPARISON:  Head CT 02/20/2018 FINDINGS: CT HEAD FINDINGS Brain: Generalized atrophy unchanged from prior exam. Similar degree of chronic small vessel ischemia. No intracranial hemorrhage, mass effect, or midline shift. No hydrocephalus. The basilar cisterns are patent. No evidence of territorial infarct or acute ischemia. No extra-axial or intracranial fluid collection. Minimal air in the region of the cavernous sinus, typically seen with IV catheter placement. Vascular: Atherosclerosis of skullbase vasculature without hyperdense vessel or abnormal calcification. Skull: No fracture or focal lesion. Sinuses/Orbits: No sinus fluid level or signs of inflammation. Orbits are unremarkable. Other: None. CT CERVICAL SPINE FINDINGS Alignment: Normal. Skull base and vertebrae: No acute fracture. Vertebral body heights are maintained. The dens and skull base are intact. Soft tissues and spinal canal: No prevertebral fluid or swelling. No visible canal hematoma. Disc levels: Mild diffuse endplate spurring. Disc space narrowing at C6-C7. Scattered facet arthropathy. Upper chest: Carotid calcifications. Other: None. IMPRESSION: 1. No acute intracranial abnormality. Unchanged atrophy and chronic small vessel ischemia. 2. Degenerative change in the cervical spine without acute fracture or subluxation. Electronically Signed   By: Rubye OaksMelanie  Ehinger M.D.   On: 04/11/2018 23:08   Ct Cervical Spine Wo Contrast  Result Date: 04/11/2018 CLINICAL DATA:  Unwitnessed fall. Found on bathroom floor by family. Altered mental status. EXAM: CT HEAD WITHOUT CONTRAST CT CERVICAL SPINE WITHOUT CONTRAST TECHNIQUE: Multidetector CT imaging of the head and cervical spine was performed following the standard protocol without intravenous contrast. Multiplanar CT image reconstructions of the cervical  spine were also generated. COMPARISON:  Head CT 02/20/2018  FINDINGS: CT HEAD FINDINGS Brain: Generalized atrophy unchanged from prior exam. Similar degree of chronic small vessel ischemia. No intracranial hemorrhage, mass effect, or midline shift. No hydrocephalus. The basilar cisterns are patent. No evidence of territorial infarct or acute ischemia. No extra-axial or intracranial fluid collection. Minimal air in the region of the cavernous sinus, typically seen with IV catheter placement. Vascular: Atherosclerosis of skullbase vasculature without hyperdense vessel or abnormal calcification. Skull: No fracture or focal lesion. Sinuses/Orbits: No sinus fluid level or signs of inflammation. Orbits are unremarkable. Other: None. CT CERVICAL SPINE FINDINGS Alignment: Normal. Skull base and vertebrae: No acute fracture. Vertebral body heights are maintained. The dens and skull base are intact. Soft tissues and spinal canal: No prevertebral fluid or swelling. No visible canal hematoma. Disc levels: Mild diffuse endplate spurring. Disc space narrowing at C6-C7. Scattered facet arthropathy. Upper chest: Carotid calcifications. Other: None. IMPRESSION: 1. No acute intracranial abnormality. Unchanged atrophy and chronic small vessel ischemia. 2. Degenerative change in the cervical spine without acute fracture or subluxation. Electronically Signed   By: Rubye Oaks M.D.   On: 04/11/2018 23:08   Dg Chest Portable 1 View  Result Date: 04/11/2018 CLINICAL DATA:  Altered mental status EXAM: PORTABLE CHEST 1 VIEW COMPARISON:  02/20/2018 FINDINGS: Post sternotomy changes. Mild cardiomegaly with central vascular congestion. Aortic atherosclerosis. No focal opacity or pleural effusion. No pneumothorax. IMPRESSION: Cardiomegaly with mild central vascular congestion. Electronically Signed   By: Jasmine Pang M.D.   On: 04/11/2018 22:48    ____________________________________________   PROCEDURES  Procedure(s) performed:  please, see procedure note(s).  .Critical Care  Performed by: Rebecka Apley, MD  Authorized by: Rebecka Apley, MD   Critical care provider statement:    Critical care time (minutes):  45   Critical care start time:  04/12/2018 11:02 PM   Critical care end time:  04/12/2018 11:47 PM   Critical care time was exclusive of:  Separately billable procedures and treating other patients   Critical care was necessary to treat or prevent imminent or life-threatening deterioration of the following conditions:  Sepsis   Critical care was time spent personally by me on the following activities:  Development of treatment plan with patient or surrogate, evaluation of patient's response to treatment, examination of patient, obtaining history from patient or surrogate, ordering and performing treatments and interventions, ordering and review of laboratory studies, ordering and review of radiographic studies, pulse oximetry, re-evaluation of patient's condition and review of old charts   I assumed direction of critical care for this patient from another provider in my specialty: no      Critical Care performed: Yes, see critical care note(s)  ____________________________________________   INITIAL IMPRESSION / ASSESSMENT AND PLAN / ED COURSE  As part of my medical decision making, I reviewed the following data within the electronic MEDICAL RECORD NUMBER Notes from prior ED visits and [place] Controlled Substance Database  This is a 77 year old female who comes into the hospital today with fever, shakes, altered mental status and a fall.  The family is concerned that the patient may have urinary tract infection as she was admitted for 1 a little less than 2 months ago.  Given the patient's fall and the fact that she takes warfarin we did check a CT scan of the patient's head and cervical spine which were unremarkable. We also did check some blood work on the patient.  Her CBC is unremarkable.  The patient's  creatinine is 2.37.  The patient's  lactic acid is 3.2.  We did give the patient some fluid and I did order her some cefepime as she has had a UTI in the past 90 days.  Her urinalysis did return with 20-50 Haggart blood cells and many bacteria.  I will also send a urine culture.  The patient did develop some tachypnea so I did start her on BiPAP since she does have a history of CHF.  We will admit the patient to the hospitalist service.  She did receive some Tylenol and some ibuprofen for her fever.  The patient does have A. fib with rapid ventricular response but as she is febrile and likely dehydrated with an elevated creatinine I will treat her with antipyretics and fluids.  The patient's blood pressure is appropriate at this time but we will continue to monitor her heart rate and see if it decreases by treating the fever and giving the patient some fluids.  The patient will be admitted to the hospitalist service.  On the BiPAP the patient's heart rate came down into the 80s and her respiratory rate is in the 20s.  I did contact the hospitalist who will admit the patient.      ____________________________________________   FINAL CLINICAL IMPRESSION(S) / ED DIAGNOSES  Final diagnoses:  Sepsis, due to unspecified organism [facility])  Fall, initial encounter  Urinary tract infection with hematuria, site unspecified  Atrial fibrillation with rapid ventricular response (HCC)  Lactic acidosis     ED Discharge Orders    None       Note:  This document was prepared using Dragon voice recognition software and may include unintentional dictation errors.   Rebecka Apley, MD 04/12/18 0111

## 2018-04-12 NOTE — Consult Note (Signed)
ANTICOAGULATION CONSULT NOTE - Initial Consult  Pharmacy Consult for Warfarin Dosing  Indication: atrial fibrillation  Allergies  Allergen Reactions  . Ciprofloxacin Itching and Rash  . Fosamax [Alendronate Sodium] Other (See Comments)    Reaction: Ulcers in mouth and esophagus  . Iron Other (See Comments)   Patient Measurements: Height: 5\' 6"  (167.6 cm) Weight: 214 lb 8.1 oz (97.3 kg) IBW/kg (Calculated) : 59.3 Vital Signs: Temp: 97.9 F (36.6 C) (08/20 0739) Temp Source: Oral (08/20 0739) BP: 97/54 (08/20 1100) Pulse Rate: 51 (08/20 1100)  Labs: Recent Labs    04/11/18 0107 04/11/18 2211 04/12/18 0938  HGB  --  14.2  --   HCT  --  41.2  --   PLT  --  72*  --   LABPROT 23.9*  --  22.3*  INR 2.16  --  1.98  CREATININE  --  2.37*  --     Estimated Creatinine Clearance: 22.6 mL/min (A) (by C-G formula based on SCr of 2.37 mg/dL (H)).  Medical History: Past Medical History:  Diagnosis Date  . Atrial fibrillation (HCC)   . Atrial flutter (HCC)   . CHF (congestive heart failure) (HCC)   . CKD (chronic kidney disease) stage 3, GFR 30-59 ml/min (HCC)   . Coronary disease   . Dehydration   . Diabetes (HCC)   . Fracture of humeral shaft, right, closed   . Gout   . Hypertension   . Hypothyroidism   . Peripheral neuropathy   . Renal insufficiency   . Right renal artery stenosis (HCC)   . Syncope and collapse   . Vitiligo     Assessment: Pharmacy consulted for warfarin dosing and lab monitoring in 77 yo female admitted with sepsis. Patient had PMH of A. Fib and had a therapeutic INR on admission @ 2.16.   Home Regimen: Warfarin 2mg  M,Tu,W,Th,F                              Warfarin 1mg  Su,Sa  DATE INR DOSE 8/19 2.16     - 8/20 1.98  Goal of Therapy:  INR 2-3 Monitor platelets by anticoagulation protocol: Yes   Plan:  Will continue with patient's home regimen tonight.  INRs ordered daily while on antibiotics per protocol.  Pharmacy will continue to  follow daily and adjust dose as needed.   Gardner CandleSheema M Anabell Swint, PharmD, BCPS Clinical Pharmacist 04/12/2018 11:25 AM

## 2018-04-13 LAB — CBC
HCT: 36.6 % (ref 35.0–47.0)
Hemoglobin: 12.7 g/dL (ref 12.0–16.0)
MCH: 36.4 pg — ABNORMAL HIGH (ref 26.0–34.0)
MCHC: 34.6 g/dL (ref 32.0–36.0)
MCV: 105.3 fL — AB (ref 80.0–100.0)
PLATELETS: 58 10*3/uL — AB (ref 150–440)
RBC: 3.48 MIL/uL — AB (ref 3.80–5.20)
RDW: 15.1 % — ABNORMAL HIGH (ref 11.5–14.5)
WBC: 7.6 10*3/uL (ref 3.6–11.0)

## 2018-04-13 LAB — GLUCOSE, CAPILLARY
GLUCOSE-CAPILLARY: 193 mg/dL — AB (ref 70–99)
GLUCOSE-CAPILLARY: 191 mg/dL — AB (ref 70–99)
GLUCOSE-CAPILLARY: 86 mg/dL (ref 70–99)
Glucose-Capillary: 176 mg/dL — ABNORMAL HIGH (ref 70–99)

## 2018-04-13 LAB — BASIC METABOLIC PANEL
ANION GAP: 5 (ref 5–15)
BUN: 37 mg/dL — ABNORMAL HIGH (ref 8–23)
CHLORIDE: 104 mmol/L (ref 98–111)
CO2: 31 mmol/L (ref 22–32)
CREATININE: 2.07 mg/dL — AB (ref 0.44–1.00)
Calcium: 9 mg/dL (ref 8.9–10.3)
GFR calc non Af Amer: 22 mL/min — ABNORMAL LOW (ref >60)
GFR, EST AFRICAN AMERICAN: 25 mL/min — AB (ref >60)
Glucose, Bld: 188 mg/dL — ABNORMAL HIGH (ref 70–99)
POTASSIUM: 4 mmol/L (ref 3.5–5.1)
SODIUM: 140 mmol/L (ref 135–145)

## 2018-04-13 LAB — PROTIME-INR
INR: 1.75
Prothrombin Time: 20.3 seconds — ABNORMAL HIGH (ref 11.4–15.2)

## 2018-04-13 LAB — PROCALCITONIN: Procalcitonin: 9.77 ng/mL

## 2018-04-13 MED ORDER — WARFARIN - PHARMACIST DOSING INPATIENT
Freq: Every day | Status: DC
  Administered 2018-04-14 – 2018-04-17 (×3)

## 2018-04-13 MED ORDER — ENOXAPARIN SODIUM 100 MG/ML ~~LOC~~ SOLN
1.0000 mg/kg | SUBCUTANEOUS | Status: DC
  Administered 2018-04-13 – 2018-04-15 (×3): 95 mg via SUBCUTANEOUS
  Filled 2018-04-13 (×4): qty 1

## 2018-04-13 MED ORDER — ALLOPURINOL 100 MG PO TABS
100.0000 mg | ORAL_TABLET | Freq: Every day | ORAL | Status: DC
  Administered 2018-04-13 – 2018-04-18 (×6): 100 mg via ORAL
  Filled 2018-04-13 (×6): qty 1

## 2018-04-13 MED ORDER — WARFARIN SODIUM 3 MG PO TABS
3.0000 mg | ORAL_TABLET | Freq: Once | ORAL | Status: AC
  Administered 2018-04-13: 3 mg via ORAL
  Filled 2018-04-13: qty 1

## 2018-04-13 NOTE — Progress Notes (Signed)
PULMONARY / CRITICAL CARE MEDICINE   Name: Haley Hill: [id number] DOB: 1941/02/13    ADMISSION DATE:  04/11/2018 CONSULTATION DATE:  04/12/2018  REFERRING MD:  Dr. Marjie SkiffSridharan  CHIEF COMPLAINT:  AMS, UTI, Sepsis  HISTORY OF PRESENT ILLNESS:   Haley Hill is a 77 y.o. Female with a PMH as listed below, who presented to [facility] ED on 04/11/18 after a fall at home while going to the bathroom. Pt is lethargic and no family is present, therefore history is obtained from ED and nursing notes.  Pt's family reported she had some shivering, complained of chills, and was confused.  Pt was treated for UTI 6/30-7/03 (pan sensitive E.coli).  Upon arrival to ED, pt was febrile (100.8 F) and with AMS.  Initial workup in the ED revealed: Lactic acid 3.2, Cr. 2.37, UA positive for UTI, CXR with cardiomegaly and mild vascular congestion.  CT Head and CT cervical spine negative for any acute processes.  Pt received Cefepime and 2L NS boluses, with 3rd liter of NS infusing at 200 ml/hr.  Pt subsequently developed Acute Respiratory Distress following volume resuscitation, requiring BiPAP.  She is admitted to [facility] ICU for treatment of AMS, sepsis secondary to UTI, AKI on CKD III, and Acute Respiratory Distress requiring BiPAP.  PCCM is consulted for further management.  PAST MEDICAL HISTORY :  She  has a past medical history of Atrial fibrillation (HCC), Atrial flutter (HCC), CHF (congestive heart failure) (HCC), CKD (chronic kidney disease) stage 3, GFR 30-59 ml/min (HCC), Coronary disease, Dehydration, Diabetes (HCC), Fracture of humeral shaft, right, closed, Gout, Hypertension, Hypothyroidism, Peripheral neuropathy, Renal insufficiency, Right renal artery stenosis (HCC), Syncope and collapse, and Vitiligo.  PAST SURGICAL HISTORY: She  has a past surgical history that includes Breast biopsy (Right, 10/2000); Breast excisional biopsy (Bilateral, 1970's); Cardiac catheterization; Coronary artery bypass graft; Fracture  surgery; and Esophagogastroduodenoscopy (egd) with propofol (N/A, 06/10/2017).  Allergies  Allergen Reactions  . Ciprofloxacin Itching and Rash  . Fosamax [Alendronate Sodium] Other (See Comments)    Reaction: Ulcers in mouth and esophagus  . Iron Other (See Comments)    No current facility-administered medications on file prior to encounter.    Current Outpatient Medications on File Prior to Encounter  Medication Sig  . acetaminophen (TYLENOL) 500 MG tablet Take 500 mg by mouth 2 (two) times daily as needed. for pain/ increased temp. May be administered orally, per G-tube if needed or rectally if unable to swallow (separate order). Maximum dose for 24 hours is 3,000 mg from all sources of Acetaminophen/ Tylenol   . allopurinol (ZYLOPRIM) 300 MG tablet Take 1 tablet (300 mg total) by mouth daily.  . calcitRIOL (ROCALTROL) 0.25 MCG capsule Take 1 capsule (0.25 mcg total) by mouth 3 (three) times a week. Monday Wednesday Friday  . Cholecalciferol (VITAMIN D) 2000 UNITS CAPS Take 2,000 Units by mouth daily.  . Cyanocobalamin 5000 MCG LOZG Take 5,000 mcg by mouth daily.   . ferrous sulfate 325 (65 FE) MG tablet Take 325 mg by mouth daily with breakfast.  . furosemide (LASIX) 20 MG tablet Take 1 tablet (20 mg total) by mouth 2 (two) times daily.  Marland Kitchen. gabapentin (NEURONTIN) 800 MG tablet Take 1 tablet (800 mg total) by mouth every 8 (eight) hours.  Marland Kitchen. glipiZIDE (GLUCOTROL) 10 MG tablet Take 1 tablet (10 mg total) by mouth 2 (two) times daily before a meal.  . HUMULIN 70/30 KWIKPEN (70-30) 100 UNIT/ML PEN Inject 15 Units into the skin 2 (two)  times daily.  Marland Kitchen HYDROcodone-acetaminophen (NORCO/VICODIN) 5-325 MG tablet Take 1 tablet by mouth 2 (two) times daily as needed for moderate pain.  . isosorbide mononitrate (ISMO,MONOKET) 20 MG tablet Take 1 tablet (20 mg total) by mouth daily.  Marland Kitchen levothyroxine (SYNTHROID, LEVOTHROID) 112 MCG tablet Take 1 tablet (112 mcg total) by mouth daily before breakfast.   . metoprolol succinate (TOPROL-XL) 25 MG 24 hr tablet Take 1 tablet (25 mg total) by mouth daily.  Marland Kitchen omeprazole (PRILOSEC) 20 MG capsule Take 1 capsule (20 mg total) by mouth 2 (two) times daily.  . potassium chloride SA (K-DUR,KLOR-CON) 20 MEQ tablet Take 1 tablet (20 mEq total) by mouth daily.  Marland Kitchen rOPINIRole (REQUIP) 0.5 MG tablet Take 0.5 mg by mouth at bedtime.  . simvastatin (ZOCOR) 40 MG tablet Take 1 tablet (40 mg total) by mouth at bedtime.  Marland Kitchen warfarin (COUMADIN) 1 MG tablet Take 1 tablet (1 mg total) by mouth daily. (Patient taking differently: Take 1-2 mg by mouth daily. Pt takes 2 mg Monday to Friday, 1 mg Saturday and Sunday.)  . calcitRIOL (ROCALTROL) 0.5 MCG capsule Take 1 capsule (0.5 mcg total) by mouth daily. (Patient not taking: Reported on 04/11/2018)  . diclofenac sodium (VOLTAREN) 1 % GEL Apply 2 g topically 4 (four) times daily. Apply to left foot (Patient not taking: Reported on 04/11/2018)  . ONE TOUCH ULTRA TEST test strip 1 each by Other route 4 (four) times daily.  Marland Kitchen senna-docusate (SENOKOT-S) 8.6-50 MG per tablet Take 1 tablet by mouth at bedtime as needed for mild constipation. (Patient not taking: Reported on 04/11/2018)    FAMILY HISTORY:  Her family history includes Arthritis in her other; Gout in her other; Osteoporosis in her other; Stroke in her other.  SOCIAL HISTORY: She  reports that she has never smoked. She has never used smokeless tobacco. She reports that she does not drink alcohol or use drugs.  REVIEW OF SYSTEMS:   Positives in BOLD: Gen: Denies fever, chills, weight change, fatigue, night sweats HEENT: Denies blurred vision, double vision, hearing loss, tinnitus, sinus congestion, rhinorrhea, sore throat, neck stiffness, dysphagia PULM: Denies + Slight shortness of breath, cough, sputum production, hemoptysis, wheezing CV: Denies chest pain, edema, orthopnea, paroxysmal nocturnal dyspnea, palpitations GI: Denies abdominal pain, nausea, vomiting,  diarrhea, hematochezia, melena, constipation, change in bowel habits GU: Denies dysuria, hematuria, polyuria, oliguria, urethral discharge Endocrine: Denies hot or cold intolerance, polyuria, polyphagia or appetite change Derm: Denies rash, dry skin, scaling or peeling skin change Heme: Denies easy bruising, bleeding, bleeding gums Neuro: Denies headache, numbness, weakness, slurred speech, loss of memory or consciousness  SUBJECTIVE:  Reports slight SOB Denies wheezing or chest pain Sitting in bed eating breakfast On 3L [place] Remains of Dopamine gtt, currently at 2 mcg.  VITAL SIGNS: BP (!) 176/70   Pulse 64   Temp 97.8 F (36.6 C) (Oral)   Resp (!) 21   Ht 5\' 6"  (1.676 m)   Wt 97.3 kg   SpO2 100%   BMI 34.62 kg/m   HEMODYNAMICS:    VENTILATOR SETTINGS:    INTAKE / OUTPUT: I/O last 3 completed shifts: In: 300 [P.O.:240; I.V.:60] Out: 2250 [Urine:2250]  PHYSICAL EXAMINATION: General:  Acutely ill appearing female, sitting in bed, on 3LNC, in NAD Neuro:  Awake, A&O x3 (disoriented to situation), follows commands, no focal deficits  HEENT:  Atraumatic, normocephalic, neck supple, no JVD  Cardiovascular:  Irregularly irregular rhythm, rate controlled in 60's, No M/R/G, 2+ pulses throughout  Lungs:  Clear diminished bilaterally, even, nonlabored, normal effort, on 3L [place] Abdomen:  Soft, nontender, BS+ x4  Musculoskeletal:  No deformities, normal bulk and tone  Skin:  Warm, dry.  No obvious rashes, lesions, or ulcerations   LABS:  BMET Recent Labs  Lab 04/11/18 2211 04/13/18 0426  NA 138 140  K 4.3 4.0  CL 98 104  CO2 30 31  BUN 35* 37*  CREATININE 2.37* 2.07*  GLUCOSE 255* 188*    Electrolytes Recent Labs  Lab 04/11/18 2211 04/13/18 0426  CALCIUM 9.6 9.0    CBC Recent Labs  Lab 04/11/18 2211 04/13/18 0426  WBC 8.5 7.6  HGB 14.2 12.7  HCT 41.2 36.6  PLT 72* 58*    Coag's Recent Labs  Lab 04/11/18 0107 04/12/18 0938 04/13/18 0426  INR 2.16  1.98 1.75    Sepsis Markers Recent Labs  Lab 04/11/18 2211 04/12/18 0107 04/12/18 0528 04/13/18 0426  LATICACIDVEN 3.2* 2.5*  --   --   PROCALCITON  --   --  7.30 9.77    ABG No results for input(s): PHART, PCO2ART, PO2ART in the last 168 hours.  Liver Enzymes Recent Labs  Lab 04/11/18 2211  AST 59*  ALT 26  ALKPHOS 110  BILITOT 1.9*  ALBUMIN 4.1    Cardiac Enzymes No results for input(s): TROPONINI, PROBNP in the last 168 hours.  Glucose Recent Labs  Lab 04/12/18 0503 04/12/18 0742 04/12/18 1201 04/12/18 1650 04/12/18 2119  GLUCAP 298* 303* 171* 132* 204*    Imaging [place] Renal  Result Date: 04/12/2018 CLINICAL DATA:  Acute renal failure. EXAM: RENAL / URINARY TRACT ULTRASOUND COMPLETE COMPARISON:  Abdominal ultrasound 10/08/2016 FINDINGS: Examination was limited by patient condition and positioning. Right Kidney: Length: 11.2 cm. Suboptimal assessment of the right kidney without hydronephrosis or gross mass. Left Kidney: Length: 9.8 cm. Echogenicity within normal limits. No hydronephrosis. 11.1 cm cyst, similar in size to the prior study. Bladder: Appears normal for degree of bladder distention. IMPRESSION: 1. Suboptimal assessment of the right kidney.  No hydronephrosis. 2. Chronic large left renal cyst. Electronically Signed   By: Sebastian AcheAllen  Grady M.D.   On: 04/12/2018 10:59   Dg Chest Port 1 View  Result Date: 04/12/2018 CLINICAL DATA:  PICC placement. EXAM: PORTABLE CHEST 1 VIEW COMPARISON:  Radiograph of April 11, 2018. FINDINGS: Stable cardiomegaly. Atherosclerosis of thoracic aorta is noted. No pneumothorax or pleural effusion is noted. Interval placement of right-sided PICC line with distal tip in expected position of SVC. No acute pulmonary disease is noted. Bony thorax is unremarkable. IMPRESSION: Interval placement of right-sided PICC line with distal tip in expected position of the SVC. Electronically Signed   By: Lupita RaiderJames  Green Jr, M.D.   On: 04/12/2018 13:48    [place] Ekg Site Rite  Result Date: 04/12/2018 If Site Rite image not attached, placement could not be confirmed due to current cardiac rhythm.    STUDIES:  CT Head 04/11/18>> No acute intracranial abnormality. Unchanged atrophy and chronic small vessel ischemia. CT Cervical Spine 04/11/18>> Degenerative change in the cervical spine without acute fracture or subluxation. Renal Ultrasound 04/12/18>> Suboptimal assessment of the right kidney.  No hydronephrosis. Chronic large left renal cyst.  CULTURES: Blood x2 04/12/18>> E. Col, Enterobacteriaceae>>sensitivities pending Urine 04/12/18>>  ANTIBIOTICS: Cefepime 04/12/18>>04/12/18 Meropenem 04/12/18>>  SIGNIFICANT EVENTS: 04/12/18>>Ad[facility] 04/12/18>>  with episodes of bradycardia and hypotension requiring Dopamine gtt  LINES/TUBES:  DISCUSSION: 77 y.o. Female with AMS, Sepsis secondary to UTI, AKI on  CKD III, and Acute Respiratory distress in setting of pulmonary edema following volume resuscitation requiring BiPAP.  ASSESSMENT / PLAN:  PULMONARY A: Acute Respiratory distress secondary to pulmonary edema following volume resuscitation>>improved P:   Supplemental O2 to maintain O2 sats>92%, wean as tolerated Bipap prn Follow intermittent CXR Continue PO Lasix 20 mg BID   CARDIOVASCULAR A:  Sepsis>>improved Lactic Acidosis Chronic A-fib Bradycardia Hx: HTN, CAD P:  Cardiac monitoring Maintain MAP>65 Dopamine gtt, wean as able Warfarin for anticoagulation (A-fib) Consider holding Metoprolol  RENAL A:   AKI on CKD III (baseline Cr. 1.7-2.0)>>improving P:  Monitor I&O's / urinary output Follow BMP Ensure adequate renal perfusion Avoid nephrotoxic agents as able Replace electrolytes as indicated   GASTROINTESTINAL A:   Mildly elevatedTransaminases & Hyperbilirubinemia P:   Diet as tolerated Continue Protonix Trend LFT's  HEMATOLOGIC A:   Thrombocytopenia, in setting of sepsis? P:  Monitor  for s/sx of bleeding Trend CBC Warfarin for VTE prophylaxis / anticoagulation for A-fib Transfuse for Hgb<7 Transfuse PLT for Plt count <50 & active bleeding   INFECTIOUS A:   UTI Bacteremia>> E.coli & enterobacteriaceae in Blood cultures  Sepsis P:   Monitor fever curve Trend WBC & PCT Blood culture w/ E.col & Enterobacteriaceae>>sensitivities pending Follow urine culture Continue Meropenem  ENDOCRINE A:   DM II  Hypothyroidism P:   CBG's SSI Follow ICU hypo/hyperglycemia protocol Continue Synthroid  NEUROLOGIC A:   Acute encephalopathy in setting of UTI>>Resolved P:   Provide supportive care Avoid sedating meds as able Lights on during the day Promote normal wake/sleep cycle  Consider PT and mobilization as able   FAMILY  - Updates: Updated pt at bedside 04/13/18.  No family currently at bedside during NP rounds.  - Inter-disciplinary family meet or Palliative Care meeting due by:  04/19/18    Harlon DittyJeremiah Ayriel Texidor, AGACNP-BC [facility] Pager: [phone]   04/13/2018, 7:42 AM

## 2018-04-13 NOTE — Progress Notes (Signed)
   04/13/18 1050  Clinical Encounter Type  Visited With Patient and family together;Health care provider  Visit Type Initial (advanced directive order)  Referral From Nurse  Consult/Referral To Chaplain  Recommendations  (follow up on Thurday)   While on unit, patient's nurse spoke to chaplain about patient/family interest in advanced directives.  Chaplain met with patient, son and daughter-in-law.  Chaplain and patient spoke about patient's understanding of advanced directives and conversations that she had begun with family.  Patient reported that she had 'put off' doing it but knew it needed to be done.  Chaplain offered education on health care power of attorney and the living will in addition to protocol on completing and notarizing document.  Patient decided to hold on to document to look it over and have conversation with family; may want to complete it tomorrow.  Chaplain encouraged patient and family to have chaplain paged if they would like to complete it sooner or if they have additional questions.

## 2018-04-13 NOTE — Progress Notes (Signed)
[employer] [facility] [place] [facility]   PATIENT NAME: Haley Hill    MR#:  [id number]  DATE OF BIRTH:  03/30/1941  SUBJECTIVE:  Feeling better this morning. Still feeling very tired. Denies fevers or chills.  REVIEW OF SYSTEMS:  Review of Systems  Constitutional: Negative for chills and fever.  HENT: Negative for congestion and sore throat.   Eyes: Negative for blurred vision and double vision.  Respiratory: Positive for shortness of breath. Negative for cough.   Cardiovascular: Negative for chest pain and palpitations.  Gastrointestinal: Negative for abdominal pain, nausea and vomiting.  Genitourinary: Positive for frequency and urgency. Negative for dysuria.  Neurological: Positive for weakness. Negative for dizziness and headaches.  Psychiatric/Behavioral: Negative for depression. The patient is not nervous/anxious.     DRUG ALLERGIES:   Allergies  Allergen Reactions  . Ciprofloxacin Itching and Rash  . Fosamax [Alendronate Sodium] Other (See Comments)    Reaction: Ulcers in mouth and esophagus  . Iron Other (See Comments)   VITALS:  Blood pressure (!) 89/81, pulse 69, temperature 98.1 F (36.7 C), temperature source Axillary, resp. rate (!) 23, height 5\' 6"  (1.676 m), weight 97.3 kg, SpO2 98 %. PHYSICAL EXAMINATION:  Physical Exam  Constitutional: She appears well-developed and well-nourished. Laying in bed, in NAD HENT: Normocephalic and atraumatic. MMM. Eyes: Conjunctivae, EOM and lids are normal. No scleral icterus.  Neck: Neck supple. No JVD present. No thyromegaly present.  Cardiovascular: Irregularly irregular rhythm, normal rate, S1 normal, S2 normal. No murmurs, rubs, or gallops. Pulmonary/Chest: [place] in place. Normal work of breathing. +faint bibasilar crackles present. No wheezing or rhonchi. Abdominal: +BS, soft, non-tender, non-distended Neurological: CN 2-12 grossly intact, +global weakness. Sensation intact to light touch throughout. Skin:  Skin is warm and dry. No rash noted. +venous stasis changes in the lower extremities bilaterally Psychiatric: alert and oriented x 3. Appropriate affect. LABORATORY PANEL:  Female CBC Recent Labs  Lab 04/13/18 0426  WBC 7.6  HGB 12.7  HCT 36.6  PLT 58*   ------------------------------------------------------------------------------------------------------------------ Chemistries  Recent Labs  Lab 04/11/18 2211 04/13/18 0426  NA 138 140  K 4.3 4.0  CL 98 104  CO2 30 31  GLUCOSE 255* 188*  BUN 35* 37*  CREATININE 2.37* 2.07*  CALCIUM 9.6 9.0  AST 59*  --   ALT 26  --   ALKPHOS 110  --   BILITOT 1.9*  --    RADIOLOGY:  No results found. ASSESSMENT AND PLAN:   Sepsis 2/2 to E. Coli bacteremia/UTI- blood culture growing E. Coli. Was recently admitted 6/30-7/03 and had pansensitive E. Coli UTI at that time.  - initially on vanc/cefepime, switched to meropenem - awaiting blood culture sensitivities  Acute hypoxic respiratory failure after volume resuscitation- initially on BiPAP, but has been weaned to 3L O2 by [place]. CXR with mild pulmonary vascular congestion. - continue lasix 20mg  po bid - wean O2 as able  Hypotension- likely due to sepsis - continue metoprolol - on dopamine drip  Acute renal failure in CKD III- likely due to sepsis/hypotension. Cr 2.37 > 2.07 (baseline ~1.7) - renal US with unremarkable - avoid nephrotoxic agents - IVFs stopped  Chronic a-fib- rate-controlled. - continue coumadin - continue metoprolol  Type 2 diabetes- blood sugars uncontrolled - novolog 70/30 15 units bid and moderate SSI  Transaminitis/hyperbilirubinemia- likely due to sepsis. - trend cmp  Hypothyroidism- stable - continue synthroid   All the records are reviewed and case discussed with Care Management/Social Worker.  Management plans discussed with the patient, family and they are in agreement.  CODE STATUS: Full Code  TOTAL TIME TAKING CARE OF THIS PATIENT: 35  minutes.   More than 50% of the time was spent in counseling/coordination of care: YES  POSSIBLE D/C IN 2-3 DAYS, DEPENDING ON CLINICAL CONDITION.   Haley Hill M.D on 04/13/2018 at 3:53 PM  Between 7am to 6pm - Pager - [phone]  After 6pm go to [email or web address] - Social research officer, governmentpassword EPAS [facility]  [facility] [place] [facility]  Office  [phone]  CC: Primary care physician; Barbette ReichmannHande, Vishwanath, MD  Note: This dictation was prepared with Dragon dictation along with smaller phrase technology. Any transcriptional errors that result from this process are unintentional.

## 2018-04-13 NOTE — Progress Notes (Signed)
Pharmacy Antibiotic Note  Fredrik RiggerJean S Cocozza is a 77 y.o. female admitted on 04/11/2018 with sepsis secondary to UTI. BCID now showing E. Coli.  Pharmacy has been consulted for meropenem dosing.  Plan: Continue meropenem 500mg  IV every 12 hours Will monitor renal function and adjust dose as needed.  F/U on sensitivities and narrow therapy when possible   Height: 5\' 6"  (167.6 cm) Weight: 214 lb 8.1 oz (97.3 kg) IBW/kg (Calculated) : 59.3  Temp (24hrs), Avg:98 F (36.7 C), Min:97.8 F (36.6 C), Max:98.1 F (36.7 C)  Recent Labs  Lab 04/11/18 2211 04/12/18 0107 04/13/18 0426  WBC 8.5  --  7.6  CREATININE 2.37*  --  2.07*  LATICACIDVEN 3.2* 2.5*  --     Estimated Creatinine Clearance: 25.9 mL/min (A) (by C-G formula based on SCr of 2.07 mg/dL (H)).    Allergies  Allergen Reactions  . Ciprofloxacin Itching and Rash  . Fosamax [Alendronate Sodium] Other (See Comments)    Reaction: Ulcers in mouth and esophagus  . Iron Other (See Comments)    Antimicrobials this admission: Cefepime 8/19  >>  8/20 Meropenem 8/20>>  Microbiology results: 8/19 BCID: E.coli   8/19 UA: pending  Thank you for allowing pharmacy to be a part of this patient's care.  Gardner CandleSheema M Barnabas Henriques, PharmD, BCPS Clinical Pharmacist 04/13/2018 3:25 PM

## 2018-04-13 NOTE — Progress Notes (Signed)
   04/13/18 1245  Clinical Encounter Type  Visited With Patient and family together  Visit Type Follow-up (ready to complete advanced directive)  Referral From Chaplain  Consult/Referral To Chaplain   On-call chaplain informed this chaplain that unit had called requesting visit for patient.  Upon entering unit, this chaplain learned that patient was ready to complete her advanced directive.  Chaplain reviewed patient's document and spoke with patient about her choices.  Chaplain secured witnesses and notary.  Document completed; original and copy giver to patient/family.  Copy placed in chart.

## 2018-04-13 NOTE — Consult Note (Addendum)
ANTICOAGULATION CONSULT NOTE - Initial Consult  Pharmacy Consult for Warfarin Dosing  Indication: atrial fibrillation  Allergies  Allergen Reactions  . Ciprofloxacin Itching and Rash  . Fosamax [Alendronate Sodium] Other (See Comments)    Reaction: Ulcers in mouth and esophagus  . Iron Other (See Comments)   Patient Measurements: Height: 5\' 6"  (167.6 cm) Weight: 214 lb 8.1 oz (97.3 kg) IBW/kg (Calculated) : 59.3 Vital Signs: Temp: 97.8 F (36.6 C) (08/21 0200) Temp Source: Oral (08/21 0200) BP: 176/70 (08/21 0600) Pulse Rate: 64 (08/21 0600)  Labs: Recent Labs    04/11/18 0107 04/11/18 2211 04/12/18 0938 04/13/18 0426  HGB  --  14.2  --  12.7  HCT  --  41.2  --  36.6  PLT  --  72*  --  58*  LABPROT 23.9*  --  22.3* 20.3*  INR 2.16  --  1.98 1.75  CREATININE  --  2.37*  --  2.07*    Estimated Creatinine Clearance: 25.9 mL/min (A) (by C-G formula based on SCr of 2.07 mg/dL (H)).  Medical History: Past Medical History:  Diagnosis Date  . Atrial fibrillation (HCC)   . Atrial flutter (HCC)   . CHF (congestive heart failure) (HCC)   . CKD (chronic kidney disease) stage 3, GFR 30-59 ml/min (HCC)   . Coronary disease   . Dehydration   . Diabetes (HCC)   . Fracture of humeral shaft, right, closed   . Gout   . Hypertension   . Hypothyroidism   . Peripheral neuropathy   . Renal insufficiency   . Right renal artery stenosis (HCC)   . Syncope and collapse   . Vitiligo     Assessment: Pharmacy consulted for warfarin dosing and lab monitoring in [age] yo female admitted with sepsis. Patient had PMH of A. Fib and had a therapeutic INR on admission @ 2.16.   Home Regimen: Warfarin 2mg  M,Tu,W,Th,F                              Warfarin 1mg  Su,Sa  DATE INR DOSE 8/19 2.16     - 8/20 1.98 2mg  8/21 1.75  Goal of Therapy:  INR 2-3 Monitor platelets by anticoagulation protocol: Yes   Plan:  1. INR trending down, patient did miss dose on 8/19. Will order Warfarin 3mg  x  1 dose today.  INRs ordered daily while on antibiotics per protocol.   2. Will start Enoxaparin 1mg /kg (95mg ) every 24 hours until INR therapeutic x 2.   Patient had chronic thrombocytopenia. Plts: 58 this morning, Hgb 12.7. Pharmacy will continue to follow daily and adjust dose as needed.   Gardner CandleSheema M Shanecia Hoganson, PharmD, BCPS Clinical Pharmacist 04/13/2018 8:11 AM

## 2018-04-14 LAB — COMPREHENSIVE METABOLIC PANEL
ALBUMIN: 2.8 g/dL — AB (ref 3.5–5.0)
ALK PHOS: 59 U/L (ref 38–126)
ALT: 17 U/L (ref 0–44)
AST: 29 U/L (ref 15–41)
Anion gap: 5 (ref 5–15)
BUN: 34 mg/dL — ABNORMAL HIGH (ref 8–23)
CALCIUM: 8.8 mg/dL — AB (ref 8.9–10.3)
CO2: 31 mmol/L (ref 22–32)
CREATININE: 1.74 mg/dL — AB (ref 0.44–1.00)
Chloride: 103 mmol/L (ref 98–111)
GFR calc Af Amer: 31 mL/min — ABNORMAL LOW (ref >60)
GFR calc non Af Amer: 27 mL/min — ABNORMAL LOW (ref >60)
GLUCOSE: 79 mg/dL (ref 70–99)
Potassium: 3.6 mmol/L (ref 3.5–5.1)
SODIUM: 139 mmol/L (ref 135–145)
Total Bilirubin: 1.3 mg/dL — ABNORMAL HIGH (ref 0.3–1.2)
Total Protein: 5.4 g/dL — ABNORMAL LOW (ref 6.5–8.1)

## 2018-04-14 LAB — URINE CULTURE: Culture: >=100000 — AB

## 2018-04-14 LAB — CULTURE, BLOOD (ROUTINE X 2): Special Requests: ADEQUATE

## 2018-04-14 LAB — CBC
HCT: 33.1 % — ABNORMAL LOW (ref 35.0–47.0)
Hemoglobin: 11.5 g/dL — ABNORMAL LOW (ref 12.0–16.0)
MCH: 36.2 pg — AB (ref 26.0–34.0)
MCHC: 34.8 g/dL (ref 32.0–36.0)
MCV: 104.2 fL — ABNORMAL HIGH (ref 80.0–100.0)
PLATELETS: 58 10*3/uL — AB (ref 150–440)
RBC: 3.18 MIL/uL — ABNORMAL LOW (ref 3.80–5.20)
RDW: 14.6 % — ABNORMAL HIGH (ref 11.5–14.5)
WBC: 6.2 10*3/uL (ref 3.6–11.0)

## 2018-04-14 LAB — GLUCOSE, CAPILLARY
GLUCOSE-CAPILLARY: 74 mg/dL (ref 70–99)
GLUCOSE-CAPILLARY: 150 mg/dL — AB (ref 70–99)
Glucose-Capillary: 107 mg/dL — ABNORMAL HIGH (ref 70–99)
Glucose-Capillary: 163 mg/dL — ABNORMAL HIGH (ref 70–99)

## 2018-04-14 LAB — PROCALCITONIN: Procalcitonin: 6.99 ng/mL

## 2018-04-14 LAB — PROTIME-INR
INR: 1.46
Prothrombin Time: 17.6 seconds — ABNORMAL HIGH (ref 11.4–15.2)

## 2018-04-14 MED ORDER — SENNA 8.6 MG PO TABS
1.0000 | ORAL_TABLET | Freq: Every day | ORAL | Status: DC
  Administered 2018-04-14 – 2018-04-18 (×5): 8.6 mg via ORAL
  Filled 2018-04-14 (×5): qty 1

## 2018-04-14 MED ORDER — METOPROLOL TARTRATE 5 MG/5ML IV SOLN: 5.0000 mg | INTRAVENOUS | Status: DC | PRN

## 2018-04-14 MED ORDER — SODIUM CHLORIDE 0.9 % IV SOLN
2.0000 g | Freq: Four times a day (QID) | INTRAVENOUS | Status: DC
  Filled 2018-04-14 (×2): qty 2000

## 2018-04-14 MED ORDER — WARFARIN SODIUM 4 MG PO TABS
4.0000 mg | ORAL_TABLET | Freq: Once | ORAL | Status: AC
  Administered 2018-04-14: 4 mg via ORAL
  Filled 2018-04-14: qty 1

## 2018-04-14 MED ORDER — ORAL CARE MOUTH RINSE
15.0000 mL | Freq: Two times a day (BID) | OROMUCOSAL | Status: DC
  Administered 2018-04-14 – 2018-04-16 (×4): 15 mL via OROMUCOSAL

## 2018-04-14 MED ORDER — SODIUM CHLORIDE 0.9 % IV SOLN
2.0000 g | Freq: Four times a day (QID) | INTRAVENOUS | Status: DC
  Administered 2018-04-14 – 2018-04-16 (×7): 2 g via INTRAVENOUS
  Filled 2018-04-14: qty 2
  Filled 2018-04-14 (×2): qty 2000
  Filled 2018-04-14 (×2): qty 2
  Filled 2018-04-14: qty 2000
  Filled 2018-04-14 (×2): qty 2
  Filled 2018-04-14 (×2): qty 2000

## 2018-04-14 MED ORDER — METOPROLOL TARTRATE 25 MG PO TABS
25.0000 mg | ORAL_TABLET | Freq: Two times a day (BID) | ORAL | Status: DC
  Administered 2018-04-14 – 2018-04-15 (×2): 25 mg via ORAL
  Filled 2018-04-14 (×3): qty 1

## 2018-04-14 MED ORDER — CEFAZOLIN SODIUM-DEXTROSE 1-4 GM/50ML-% IV SOLN
1.0000 g | Freq: Two times a day (BID) | INTRAVENOUS | Status: DC
  Filled 2018-04-14: qty 50

## 2018-04-14 NOTE — Plan of Care (Signed)
  Problem: Pain Managment: Goal: General experience of comfort will improve Outcome: Progressing   

## 2018-04-14 NOTE — Progress Notes (Signed)
Report given to CenterBerenice, Charity fundraiserN. Pt transferred.

## 2018-04-14 NOTE — Progress Notes (Signed)
   04/14/18 1045  Clinical Encounter Type  Visited With Patient  Visit Type Follow-up  Spiritual Encounters  Spiritual Needs Emotional   Chaplain followed up with patient to offer emotional support and assess patient needs.  Chaplain facilitated conversation around yesterday's discussion and completion of advanced directives.  Patient expressed that 'it needed to be done.'  Exploration of resources and supports available to patient.  She spoke of living with her son and daughter-in-law.  Discussion of upcoming transfer off unit and patient's perceptions of move.  She's appreciative of the team on this unit and will miss them but is glad that there is movement.  Patient expressed feelings of fatigue.  Chaplain excused herself and reminded patient that chaplains are here all day everyday if there's anything that she needs or would like a follow up visit.

## 2018-04-14 NOTE — Progress Notes (Signed)
PULMONARY / CRITICAL CARE MEDICINE   Name: Haley Hill MRN: [id number] DOB: 1941/02/13    ADMISSION DATE:  04/11/2018 CONSULTATION DATE:  04/12/2018  REFERRING MD:  Dr. Marjie SkiffSridharan  CHIEF COMPLAINT:  AMS, UTI, Sepsis  HISTORY OF PRESENT ILLNESS:   Haley Hill is a 77 y.o. Female with a PMH as listed below, who presented to [facility] ED on 04/11/18 after a fall at home while going to the bathroom. Pt is lethargic and no family is present, therefore history is obtained from ED and nursing notes.  Pt's family reported she had some shivering, complained of chills, and was confused.  Pt was treated for UTI 6/30-7/03 (pan sensitive E.coli).  Upon arrival to ED, pt was febrile (100.8 F) and with AMS.  Initial workup in the ED revealed: Lactic acid 3.2, Cr. 2.37, UA positive for UTI, CXR with cardiomegaly and mild vascular congestion.  CT Head and CT cervical spine negative for any acute processes.  Pt received Cefepime and 2L NS boluses, with 3rd liter of NS infusing at 200 ml/hr.  Pt subsequently developed Acute Respiratory Distress following volume resuscitation, requiring BiPAP.  She is admitted to [facility] ICU for treatment of AMS, sepsis secondary to UTI, AKI on CKD III, and Acute Respiratory Distress requiring BiPAP.  PCCM is consulted for further management.  PAST MEDICAL HISTORY :  She  has a past medical history of Atrial fibrillation (HCC), Atrial flutter (HCC), CHF (congestive heart failure) (HCC), CKD (chronic kidney disease) stage 3, GFR 30-59 ml/min (HCC), Coronary disease, Dehydration, Diabetes (HCC), Fracture of humeral shaft, right, closed, Gout, Hypertension, Hypothyroidism, Peripheral neuropathy, Renal insufficiency, Right renal artery stenosis (HCC), Syncope and collapse, and Vitiligo.  PAST SURGICAL HISTORY: She  has a past surgical history that includes Breast biopsy (Right, 10/2000); Breast excisional biopsy (Bilateral, 1970's); Cardiac catheterization; Coronary artery bypass graft; Fracture  surgery; and Esophagogastroduodenoscopy (egd) with propofol (N/A, 06/10/2017).  Allergies  Allergen Reactions  . Ciprofloxacin Itching and Rash  . Fosamax [Alendronate Sodium] Other (See Comments)    Reaction: Ulcers in mouth and esophagus  . Iron Other (See Comments)    No current facility-administered medications on file prior to encounter.    Current Outpatient Medications on File Prior to Encounter  Medication Sig  . acetaminophen (TYLENOL) 500 MG tablet Take 500 mg by mouth 2 (two) times daily as needed. for pain/ increased temp. May be administered orally, per G-tube if needed or rectally if unable to swallow (separate order). Maximum dose for 24 hours is 3,000 mg from all sources of Acetaminophen/ Tylenol   . allopurinol (ZYLOPRIM) 300 MG tablet Take 1 tablet (300 mg total) by mouth daily.  . calcitRIOL (ROCALTROL) 0.25 MCG capsule Take 1 capsule (0.25 mcg total) by mouth 3 (three) times a week. Monday Wednesday Friday  . Cholecalciferol (VITAMIN D) 2000 UNITS CAPS Take 2,000 Units by mouth daily.  . Cyanocobalamin 5000 MCG LOZG Take 5,000 mcg by mouth daily.   . ferrous sulfate 325 (65 FE) MG tablet Take 325 mg by mouth daily with breakfast.  . furosemide (LASIX) 20 MG tablet Take 1 tablet (20 mg total) by mouth 2 (two) times daily.  Marland Kitchen. gabapentin (NEURONTIN) 800 MG tablet Take 1 tablet (800 mg total) by mouth every 8 (eight) hours.  Marland Kitchen. glipiZIDE (GLUCOTROL) 10 MG tablet Take 1 tablet (10 mg total) by mouth 2 (two) times daily before a meal.  . HUMULIN 70/30 KWIKPEN (70-30) 100 UNIT/ML PEN Inject 15 Units into the skin 2 (two)  times daily.  Marland Kitchen HYDROcodone-acetaminophen (NORCO/VICODIN) 5-325 MG tablet Take 1 tablet by mouth 2 (two) times daily as needed for moderate pain.  . isosorbide mononitrate (ISMO,MONOKET) 20 MG tablet Take 1 tablet (20 mg total) by mouth daily.  Marland Kitchen levothyroxine (SYNTHROID, LEVOTHROID) 112 MCG tablet Take 1 tablet (112 mcg total) by mouth daily before breakfast.   . metoprolol succinate (TOPROL-XL) 25 MG 24 hr tablet Take 1 tablet (25 mg total) by mouth daily.  Marland Kitchen omeprazole (PRILOSEC) 20 MG capsule Take 1 capsule (20 mg total) by mouth 2 (two) times daily.  . potassium chloride SA (K-DUR,KLOR-CON) 20 MEQ tablet Take 1 tablet (20 mEq total) by mouth daily.  Marland Kitchen rOPINIRole (REQUIP) 0.5 MG tablet Take 0.5 mg by mouth at bedtime.  . simvastatin (ZOCOR) 40 MG tablet Take 1 tablet (40 mg total) by mouth at bedtime.  Marland Kitchen warfarin (COUMADIN) 1 MG tablet Take 1 tablet (1 mg total) by mouth daily. (Patient taking differently: Take 1-2 mg by mouth daily. Pt takes 2 mg Monday to Friday, 1 mg Saturday and Sunday.)  . calcitRIOL (ROCALTROL) 0.5 MCG capsule Take 1 capsule (0.5 mcg total) by mouth daily. (Patient not taking: Reported on 04/11/2018)  . diclofenac sodium (VOLTAREN) 1 % GEL Apply 2 g topically 4 (four) times daily. Apply to left foot (Patient not taking: Reported on 04/11/2018)  . ONE TOUCH ULTRA TEST test strip 1 each by Other route 4 (four) times daily.  Marland Kitchen senna-docusate (SENOKOT-S) 8.6-50 MG per tablet Take 1 tablet by mouth at bedtime as needed for mild constipation. (Patient not taking: Reported on 04/11/2018)    FAMILY HISTORY:  Her family history includes Arthritis in her other; Gout in her other; Osteoporosis in her other; Stroke in her other.  SOCIAL HISTORY: She  reports that she has never smoked. She has never used smokeless tobacco. She reports that she does not drink alcohol or use drugs.  REVIEW OF SYSTEMS:   Positives in BOLD: Gen: Denies fever, chills, weight change, fatigue, night sweats HEENT: Denies blurred vision, double vision, hearing loss, tinnitus, sinus congestion, rhinorrhea, sore throat, neck stiffness, dysphagia PULM: Denies + Slight shortness of breath, cough, sputum production, hemoptysis, wheezing CV: Denies chest pain, edema, orthopnea, paroxysmal nocturnal dyspnea, palpitations GI: Denies abdominal pain, nausea, vomiting,  diarrhea, hematochezia, melena, constipation, change in bowel habits GU: Denies dysuria, hematuria, polyuria, oliguria, urethral discharge Endocrine: Denies hot or cold intolerance, polyuria, polyphagia or appetite change Derm: Denies rash, dry skin, scaling or peeling skin change Heme: Denies easy bruising, bleeding, bleeding gums Neuro: Denies headache, numbness, weakness, slurred speech, loss of memory or consciousness  SUBJECTIVE:  Patient awake, alert, communicating, just a breakfast and she is feeling much better  VITAL SIGNS: BP 126/77   Pulse (!) 59   Temp 98.4 F (36.9 C)   Resp (!) 22   Ht 5\' 6"  (1.676 m)   Wt 97.3 kg   SpO2 97%   BMI 34.62 kg/m   HEMODYNAMICS:  INTAKE / OUTPUT: I/O last 3 completed shifts: In: 1034.8 [P.O.:480; I.V.:355.8; IV Piggyback:199.1] Out: 3950 [Urine:3950]  PHYSICAL EXAMINATION: General:awake alert oriented in no acute distress on nasal cannula sitting up eating breakfast Neuro:  Awake, A&O x3 (disoriented to situation), follows commands, no focal deficits  HEENT:  Atraumatic, normocephalic, neck supple, no JVD  Cardiovascular:  Irregularly irregular rhythm, rate controlled in 60's, No M/R/G, 2+ pulses throughout  Lungs:  Clear diminished bilaterally, even, nonlabored, normal effort, on 3L  Abdomen:  Soft, nontender,  BS+ x4  Musculoskeletal:  No deformities, normal bulk and tone  Skin:  Warm, dry.  No obvious rashes, lesions, or ulcerations   LABS:  BMET Recent Labs  Lab 04/11/18 2211 04/13/18 0426 04/14/18 0411  NA 138 140 139  K 4.3 4.0 3.6  CL 98 104 103  CO2 30 31 31   BUN 35* 37* 34*  CREATININE 2.37* 2.07* 1.74*  GLUCOSE 255* 188* 79    Electrolytes Recent Labs  Lab 04/11/18 2211 04/13/18 0426 04/14/18 0411  CALCIUM 9.6 9.0 8.8*    CBC Recent Labs  Lab 04/11/18 2211 04/13/18 0426 04/14/18 0411  WBC 8.5 7.6 6.2  HGB 14.2 12.7 11.5*  HCT 41.2 36.6 33.1*  PLT 72* 58* 58*    Coag's Recent Labs  Lab  04/12/18 0938 04/13/18 0426 04/14/18 0411  INR 1.98 1.75 1.46    Sepsis Markers Recent Labs  Lab 04/11/18 2211 04/12/18 0107 04/12/18 0528 04/13/18 0426 04/14/18 0411  LATICACIDVEN 3.2* 2.5*  --   --   --   PROCALCITON  --   --  7.30 9.77 6.99    ABG No results for input(s): PHART, PCO2ART, PO2ART in the last 168 hours.  Liver Enzymes Recent Labs  Lab 04/11/18 2211 04/14/18 0411  AST 59* 29  ALT 26 17  ALKPHOS 110 59  BILITOT 1.9* 1.3*  ALBUMIN 4.1 2.8*    Cardiac Enzymes No results for input(s): TROPONINI, PROBNP in the last 168 hours.  Glucose Recent Labs  Lab 04/12/18 2119 04/13/18 0749 04/13/18 1106 04/13/18 1610 04/13/18 2120 04/14/18 0744  GLUCAP 204* 193* 191* 176* 86 74    Imaging No results found.   STUDIES:  CT Head 04/11/18>> No acute intracranial abnormality. Unchanged atrophy and chronic small vessel ischemia. CT Cervical Spine 04/11/18>> Degenerative change in the cervical spine without acute fracture or subluxation. Renal Ultrasound 04/12/18>> Suboptimal assessment of the right kidney.  No hydronephrosis. Chronic large left renal cyst.  CULTURES: Blood x2 04/12/18>> E. Col, Enterobacteriaceae>>sensitivities pending Urine 04/12/18>>  ANTIBIOTICS: Cefepime 04/12/18>>04/12/18 Meropenem 04/12/18>>  SIGNIFICANT EVENTS: 04/12/18>>Admitted to [facility] Stepdown 04/12/18>>  with episodes of bradycardia and hypotension requiring Dopamine gtt  LINES/TUBES:  DISCUSSION: 77 y.o. Female with AMS, Sepsis secondary to UTI, AKI on CKD III, and Acute Respiratory distress in setting of pulmonary edema following volume resuscitation requiring BiPAP.  ASSESSMENT / PLAN:  Acute Respiratory distress secondary to pulmonary edema following volume resuscitation. Patient doing much better, has been weaned off of BiPAP, presently on nasal cannula with no shortness of breath  Chronic A-fib Patient has had atrial fibrillation with ventricular response on the lower  side. This morning it's been in the 60s. Lopressor being held for bradycardia  UroSepsis. Patient has grown Escherichia coli in the urine and now in the blood, presently on meropenem. Will de-escalate antibiotics, pan sensitive  RENAL. Patient's BUN 34/creatinine 1.74. Slowly improving with stabilizing hemodynamics Most likely ATN on top of chronic renal insufficiency  DM II. Blood sugars 79 this morning. Will support oral intake and recheck  Hypothyroidism. On replacement  Patient is stable for floor transfer telemetry  Tora KindredJohn Emaan Gary, DO  04/14/2018, 9:13 AM  Patient ID: Haley Hill, female   DOB: 1940-10-18, 77 y.o.   MRN: [id number]

## 2018-04-14 NOTE — Progress Notes (Signed)
Pharmacy Antibiotic Note  Fredrik RiggerJean S Cdebaca is a 77 y.o. female admitted on 04/11/2018 with sepsis secondary to UTI. BCx now showing pan-sensitive E. Coli. Patient will be switched from Meropenem to Ampicillin.  Plan: Start Ampicillin 2g IV every 6 hours while inpatient. Recommend PO  Amoxicillin or Cephalexin at discharge.   Height: 5\' 6"  (167.6 cm) Weight: 214 lb 8.1 oz (97.3 kg) IBW/kg (Calculated) : 59.3  Temp (24hrs), Avg:98.5 F (36.9 C), Min:98.1 F (36.7 C), Max:98.8 F (37.1 C)  Recent Labs  Lab 04/11/18 2211 04/12/18 0107 04/13/18 0426 04/14/18 0411  WBC 8.5  --  7.6 6.2  CREATININE 2.37*  --  2.07* 1.74*  LATICACIDVEN 3.2* 2.5*  --   --     Estimated Creatinine Clearance: 30.8 mL/min (A) (by C-G formula based on SCr of 1.74 mg/dL (H)).    Allergies  Allergen Reactions  . Ciprofloxacin Itching and Rash  . Fosamax [Alendronate Sodium] Other (See Comments)    Reaction: Ulcers in mouth and esophagus  . Iron Other (See Comments)    Antimicrobials this admission: Cefepime 8/19  >>  8/20 Meropenem 8/20>> 8/22 Ampicillin 8/22>>  Microbiology results: 8/19 BCID: E.coli   8/19 UA: E.coli   Thank you for allowing pharmacy to be a part of this patient's care.  Gardner CandleSheema M Sophi Calligan, PharmD, BCPS Clinical Pharmacist 04/14/2018 11:11 AM

## 2018-04-14 NOTE — Progress Notes (Signed)
Patient ID: Haley Hill, female   DOB: 01-26-1941, 77 y.o.   MRN: [id number]  [employer] PROGRESS NOTE  Haley Hill:[id number] DOB: 01/12/1941 DOA: 04/11/2018 PCP: Barbette Reichmann, MD  HPI/Subjective: Patient feeling okay.  Offers no complaints.  Feels a little weak.  Just came out of the ICU today.  Had a bowel movement today.  Objective: Vitals:   04/14/18 1235 04/14/18 1248  BP: (!) 155/84   Pulse: 67   Resp:  18  Temp:    SpO2: 100%     Filed Weights   04/11/18 2206 04/12/18 0500 04/14/18 1248  Weight: 95.3 kg 97.3 kg 95.2 kg    ROS: Review of Systems  Constitutional: Negative for chills and fever.  Eyes: Negative for blurred vision.  Respiratory: Negative for cough and shortness of breath.   Cardiovascular: Negative for chest pain.  Gastrointestinal: Negative for abdominal pain, constipation, diarrhea, nausea and vomiting.  Genitourinary: Negative for dysuria.  Musculoskeletal: Negative for joint pain.  Neurological: Negative for dizziness and headaches.   Exam: Physical Exam  Constitutional: She is oriented to person, place, and time.  HENT:  Nose: No mucosal edema.  Mouth/Throat: No oropharyngeal exudate or posterior oropharyngeal edema.  Eyes: Pupils are equal, round, and reactive to light. Conjunctivae, EOM and lids are normal.  Neck: No JVD present. Carotid bruit is not present. No edema present. No thyroid mass and no thyromegaly present.  Cardiovascular: S1 normal and S2 normal. Exam reveals no gallop.  No murmur heard. Pulses:      Dorsalis pedis pulses are 2+ on the right side, and 2+ on the left side.  Respiratory: No respiratory distress. She has decreased breath sounds in the right lower field and the left lower field. She has no wheezes. She has no rhonchi. She has no rales.  GI: Soft. Bowel sounds are normal. There is no tenderness.  Musculoskeletal:       Right ankle: She exhibits swelling.       Left ankle: She exhibits swelling.   Lymphadenopathy:    She has no cervical adenopathy.  Neurological: She is alert and oriented to person, place, and time. No cranial nerve deficit.  Skin: Skin is warm. No rash noted. Nails show no clubbing.  Psychiatric: She has a normal mood and affect.      Data Reviewed: Basic Metabolic Panel: Recent Labs  Lab 04/11/18 2211 04/13/18 0426 04/14/18 0411  NA 138 140 139  K 4.3 4.0 3.6  CL 98 104 103  CO2 30 31 31   GLUCOSE 255* 188* 79  BUN 35* 37* 34*  CREATININE 2.37* 2.07* 1.74*  CALCIUM 9.6 9.0 8.8*   Liver Function Tests: Recent Labs  Lab 04/11/18 2211 04/14/18 0411  AST 59* 29  ALT 26 17  ALKPHOS 110 59  BILITOT 1.9* 1.3*  PROT 7.1 5.4*  ALBUMIN 4.1 2.8*   No results for input(s): LIPASE, AMYLASE in the last 168 hours. No results for input(s): AMMONIA in the last 168 hours. CBC: Recent Labs  Lab 04/11/18 2211 04/13/18 0426 04/14/18 0411  WBC 8.5 7.6 6.2  NEUTROABS 7.4*  --   --   HGB 14.2 12.7 11.5*  HCT 41.2 36.6 33.1*  MCV 105.2* 105.3* 104.2*  PLT 72* 58* 58*    CBG: Recent Labs  Lab 04/13/18 1106 04/13/18 1610 04/13/18 2120 04/14/18 0744 04/14/18 1254  GLUCAP 191* 176* 86 74 107*    Recent Results (from the past 240 hour(s))  Blood culture (routine  x 2)     Status: Abnormal   Collection Time: 04/11/18 10:11 PM  Result Value Ref Range Status   Specimen Description   Final    BLOOD LEFT ANTECUBITAL Performed at [facility], [address] [place], [place] [address]    Special Requests   Final    BOTTLES DRAWN AEROBIC AND ANAEROBIC Blood Culture results may not be optimal due to an excessive volume of blood received in culture bottles Performed at [facility], [address] [place], [place] [address]    Culture  Setup Time   Final    IN BOTH AEROBIC AND ANAEROBIC BOTTLES GRAM NEGATIVE RODS CRITICAL RESULT CALLED TO, READ BACK BY AND VERIFIED WITH: RODNEY GRUBB AT 1034 ON 04/12/2018 JJB Performed at  [facility], [address] [place], [place] [address]    Culture (A)  Final    ESCHERICHIA COLI SUSCEPTIBILITIES PERFORMED ON PREVIOUS CULTURE WITHIN THE LAST 5 DAYS. Performed at [facility], [address] [place], [place] [address]    Report Status 04/14/2018 FINAL  Final  Blood culture (routine x 2)     Status: Abnormal   Collection Time: 04/11/18 10:31 PM  Result Value Ref Range Status   Specimen Description   Final    BLOOD RIGHT HAND Performed at [facility], [address] [place], [place] [address]    Special Requests   Final    BOTTLES DRAWN AEROBIC AND ANAEROBIC Blood Culture adequate volume Performed at [facility], [address] [place], [place] [address]    Culture  Setup Time   Final    GRAM NEGATIVE RODS IN BOTH AEROBIC AND ANAEROBIC BOTTLES CRITICAL RESULT CALLED TO, READ BACK BY AND VERIFIED WITH: RODNEY GRUBB AT 1034 ON 04/12/2018 JJB Performed at [facility], [address] [place], [place] [address]    Culture ESCHERICHIA COLI (A)  Final   Report Status 04/14/2018 FINAL  Final   Organism ID, Bacteria ESCHERICHIA COLI  Final      Susceptibility   Escherichia coli - MIC*    AMPICILLIN 4 SENSITIVE Sensitive     CEFAZOLIN <=4 SENSITIVE Sensitive     CEFEPIME <=1 SENSITIVE Sensitive     CEFTAZIDIME <=1 SENSITIVE Sensitive     CEFTRIAXONE <=1 SENSITIVE Sensitive     CIPROFLOXACIN <=0.25 SENSITIVE Sensitive     GENTAMICIN <=1 SENSITIVE Sensitive     IMIPENEM <=0.25 SENSITIVE Sensitive     TRIMETH/SULFA <=20 SENSITIVE Sensitive     AMPICILLIN/SULBACTAM <=2 SENSITIVE Sensitive     PIP/TAZO <=4 SENSITIVE Sensitive     Extended ESBL NEGATIVE Sensitive     * ESCHERICHIA COLI  Blood Culture ID Panel (Reflexed)     Status: Abnormal   Collection Time: 04/11/18 10:31 PM  Result Value Ref Range Status   Enterococcus species NOT DETECTED NOT DETECTED Final   Listeria monocytogenes NOT DETECTED NOT DETECTED Final    Staphylococcus species NOT DETECTED NOT DETECTED Final   Staphylococcus aureus NOT DETECTED NOT DETECTED Final   Streptococcus species NOT DETECTED NOT DETECTED Final   Streptococcus agalactiae NOT DETECTED NOT DETECTED Final   Streptococcus pneumoniae NOT DETECTED NOT DETECTED Final   Streptococcus pyogenes NOT DETECTED NOT DETECTED Final   Acinetobacter baumannii NOT DETECTED NOT DETECTED Final   Enterobacteriaceae species DETECTED (A) NOT DETECTED Final    Comment: Enterobacteriaceae represent a large family of gram-negative bacteria, not a single organism. CRITICAL RESULT CALLED TO, READ  BACK BY AND VERIFIED WITH: RODNEY GRUBB AT 1034 ON 04/12/2018 JJB    Enterobacter cloacae complex NOT DETECTED NOT DETECTED Final   Escherichia coli DETECTED (A) NOT DETECTED Final    Comment: CRITICAL RESULT CALLED TO, READ BACK BY AND VERIFIED WITH: RODNEY GRUBB AT 1034 ON 04/12/2018 JJB    Klebsiella oxytoca NOT DETECTED NOT DETECTED Final   Klebsiella pneumoniae NOT DETECTED NOT DETECTED Final   Proteus species NOT DETECTED NOT DETECTED Final   Serratia marcescens NOT DETECTED NOT DETECTED Final   Carbapenem resistance NOT DETECTED NOT DETECTED Final   Haemophilus influenzae NOT DETECTED NOT DETECTED Final   Neisseria meningitidis NOT DETECTED NOT DETECTED Final   Pseudomonas aeruginosa NOT DETECTED NOT DETECTED Final   Candida albicans NOT DETECTED NOT DETECTED Final   Candida glabrata NOT DETECTED NOT DETECTED Final   Candida krusei NOT DETECTED NOT DETECTED Final   Candida parapsilosis NOT DETECTED NOT DETECTED Final   Candida tropicalis NOT DETECTED NOT DETECTED Final    Comment: Performed at [facility] [place], [address], [place], [place] [address]  Urine Culture     Status: Abnormal   Collection Time: 04/11/18 11:15 PM  Result Value Ref Range Status   Specimen Description   Final    URINE, RANDOM Performed at [facility], [address] [place], [place] [address]     Special Requests   Final    NONE Performed at [facility], [address] [place], [place] [address]    Culture >=100,000 COLONIES/mL ESCHERICHIA COLI (A)  Final   Report Status 04/14/2018 FINAL  Final   Organism ID, Bacteria ESCHERICHIA COLI (A)  Final      Susceptibility   Escherichia coli - MIC*    AMPICILLIN 4 SENSITIVE Sensitive     CEFAZOLIN <=4 SENSITIVE Sensitive     CEFTRIAXONE <=1 SENSITIVE Sensitive     CIPROFLOXACIN <=0.25 SENSITIVE Sensitive     GENTAMICIN <=1 SENSITIVE Sensitive     IMIPENEM <=0.25 SENSITIVE Sensitive     NITROFURANTOIN <=16 SENSITIVE Sensitive     TRIMETH/SULFA <=20 SENSITIVE Sensitive     AMPICILLIN/SULBACTAM <=2 SENSITIVE Sensitive     PIP/TAZO <=4 SENSITIVE Sensitive     Extended ESBL NEGATIVE Sensitive     * >=100,000 COLONIES/mL ESCHERICHIA COLI  MRSA PCR Screening     Status: None   Collection Time: 04/12/18  5:37 AM  Result Value Ref Range Status   MRSA by PCR NEGATIVE NEGATIVE Final    Comment:        The GeneXpert MRSA Assay (FDA approved for NASAL specimens only), is one component of a comprehensive MRSA colonization surveillance program. It is not intended to diagnose MRSA infection nor to guide or monitor treatment for MRSA infections. Performed at [facility], [address] [place], [place] [address]       Scheduled Meds: . allopurinol  100 mg Oral Daily  . calcitRIOL  0.25 mcg Oral Once per day on Mon Wed Fri  . cholecalciferol  2,000 Units Oral Daily  . enoxaparin (LOVENOX) injection  1 mg/kg Subcutaneous Q24H  . ferrous sulfate  325 mg Oral Q breakfast  . furosemide  20 mg Oral BID  . gabapentin  800 mg Oral Q8H  . insulin aspart  0-15 Units Subcutaneous TID WC  . insulin aspart  0-5 Units Subcutaneous QHS  . insulin aspart protamine- aspart  15 Units Subcutaneous BID WC  . isosorbide mononitrate  20 mg Oral Daily  .  levothyroxine  112 mcg Oral QAC breakfast  . mouth rinse  15 mL Mouth  Rinse BID  . metoprolol succinate  25 mg Oral Daily  . pantoprazole  40 mg Oral Daily  . potassium chloride SA  20 mEq Oral Daily  . rOPINIRole  0.5 mg Oral QHS  . senna  1 tablet Oral Daily  . simvastatin  40 mg Oral QHS  . sodium chloride flush  10-40 mL Intracatheter Q12H  . vitamin B-12  5,000 mcg Oral Daily  . Warfarin - Pharmacist Dosing Inpatient   Does not apply q1800   Continuous Infusions: . sodium chloride Stopped (04/13/18 1057)  . ampicillin (OMNIPEN) IV    . DOPamine 2 mcg/kg/min (04/12/18 1607)    Assessment/Plan:  1. E. coli sepsis, E. coli growing in both urine and blood cultures.  Meropenem switched to ampicillin today. 2. Septic shock resolved.  Now off pressors and transferred out of the ICU. 3. Acute hypoxic respiratory failure.  Initially requiring BiPAP.  Now on 3 L and hopefully will taper to off.  Check pulse ox on room air tomorrow morning. 4. Acute kidney injury on chronic kidney disease stage III. 5. Chronic atrial fibrillation on Coumadin and Lovenox until Coumadin level is better.  Metoprolol for rate control 6. Type 2 diabetes mellitus on 70/30 insulin and sliding scale 7. Elevated liver function test secondary to sepsis 8. Hypothyroidism unspecified on levothyroxine 9. Hyperlipidemia unspecified on simvastatin 10. Weakness physical therapy consultation  Code Status:     Code Status Orders  (From admission, onward)         Start     Ordered   04/12/18 0443  Full code  Continuous     04/12/18 0442        Code Status History    Date Active Date Inactive Code Status Order ID Comments User Context   02/20/2018 2122 02/23/2018 1724 Full Code [id number]  Ihor Austin, MD Inpatient   04/29/2015 1726 05/02/2015 2124 Full Code [id number]  Adrian Saran, MD Inpatient   01/25/2015 1633 01/28/2015 1855 Full Code [id number]  Milagros Loll, MD ED     Family Communication: Spoke with son at the bedside Disposition Plan: To be determined based on clinical  course   Antibiotics:  Ampicillin  Time spent: 28 minutes  Aziza Stuckert [employer]

## 2018-04-14 NOTE — Consult Note (Addendum)
ANTICOAGULATION CONSULT NOTE - Initial Consult  Pharmacy Consult for Warfarin and enoxaparin Dosing  Indication: atrial fibrillation  Allergies  Allergen Reactions  . Ciprofloxacin Itching and Rash  . Fosamax [Alendronate Sodium] Other (See Comments)    Reaction: Ulcers in mouth and esophagus  . Iron Other (See Comments)   Patient Measurements: Height: 5\' 6"  (167.6 cm) Weight: 214 lb 8.1 oz (97.3 kg) IBW/kg (Calculated) : 59.3 Vital Signs: Temp: 98.8 F (37.1 C) (08/22 0200) Temp Source: Axillary (08/22 0200) BP: 96/65 (08/22 0600) Pulse Rate: 59 (08/22 0600)  Labs: Recent Labs    04/11/18 2211 04/12/18 0938 04/13/18 0426 04/14/18 0411  HGB 14.2  --  12.7 11.5*  HCT 41.2  --  36.6 33.1*  PLT 72*  --  58* 58*  LABPROT  --  22.3* 20.3* 17.6*  INR  --  1.98 1.75 1.46  CREATININE 2.37*  --  2.07* 1.74*    Estimated Creatinine Clearance: 30.8 mL/min (A) (by C-G formula based on SCr of 1.74 mg/dL (H)).  Medical History: Past Medical History:  Diagnosis Date  . Atrial fibrillation (HCC)   . Atrial flutter (HCC)   . CHF (congestive heart failure) (HCC)   . CKD (chronic kidney disease) stage 3, GFR 30-59 ml/min (HCC)   . Coronary disease   . Dehydration   . Diabetes (HCC)   . Fracture of humeral shaft, right, closed   . Gout   . Hypertension   . Hypothyroidism   . Peripheral neuropathy   . Renal insufficiency   . Right renal artery stenosis (HCC)   . Syncope and collapse   . Vitiligo     Assessment: Pharmacy consulted for warfarin dosing and lab monitoring in 77 yo female admitted with sepsis. Patient had PMH of A. Fib and had a therapeutic INR on admission @ 2.16.   Home Regimen: Warfarin 2mg  M,Tu,W,Th,F                              Warfarin 1mg  Su,Sa  DATE INR DOSE 8/19 2.16     - 8/20 1.98 2mg  8/21 1.75 3mg  8/22 1.46  Goal of Therapy:  INR 2-3 Monitor platelets by anticoagulation protocol: Yes   Plan:  1. INR still trending down, patient did  miss dose on 8/19. Will order Warfarin 4mg  x 1 dose today.  INRs ordered daily while on antibiotics per protocol.   2. Continue Enoxaparin 1mg /kg (95mg ) every 24 hours until INR therapeutic x 2 based on CrCl < 3230ml./min.   Patient had chronic thrombocytopenia. Plts: 58 this morning, Hgb 11.5 (12.7). Pharmacy will continue to follow daily and adjust dose as needed.   Gardner CandleSheema M Maebry Obrien, PharmD, BCPS Clinical Pharmacist 04/14/2018 7:33 AM

## 2018-04-14 NOTE — Progress Notes (Signed)
Pt resting but easily aroused. HR of 59, asymptomatic, all other vital signs within normal limits.  Pt is A&Ox3 Disoriented to time. Stated that the year was 2002.  Denies Pain at this time. Will continue to monitor.

## 2018-04-14 NOTE — Evaluation (Signed)
Physical Therapy Evaluation Patient Details Name: Haley RiggerJean S Hill MRN: [id number] DOB: [birth year] Today's Date: 04/14/2018   History of Present Illness  77 y.o. female with a known history of T2IDDM, HTN, CKD III, Afib (Coumadin), recurrent UTI here with generalized weakness, chills, confusion, fall.  Admitte4d with sepsis/UTI.  Clinical Impression  Pt generally did well with PT exam, on O2 when PT arrived with sats 98-100%, sats remained in the 90s t/o the session on room air.  She was able to get to EOB and rise to standing w/o direct physical assist and though she did have some fatigue with in-room gait training/activity (~10 minutes apart from PT exam) she ultimately did well, showed good safety awareness with light cuing and should be safe to return home once medically cleared for d/c.  Pt is not at her baseline however and would benefit from HHPT at discharge.    Follow Up Recommendations Home health PT    Equipment Recommendations  None recommended by PT    Recommendations for Other Services       Precautions / Restrictions Precautions Precautions: Fall Restrictions Weight Bearing Restrictions: No      Mobility  Bed Mobility Overal bed mobility: Modified Independent             General bed mobility comments: Pt did well getting to EOB with moderate hand rail use, but no direct assist  Transfers Overall transfer level: Independent Equipment used: Rolling walker (2 wheeled)             General transfer comment: Pt able to rise w/o assist, did need some cuing for set up/hand placement but ultimately did well and showed good safety  Ambulation/Gait Ambulation/Gait assistance: Supervision Gait Distance (Feet): 35 Feet Assistive device: Rolling walker (2 wheeled)       General Gait Details: Pt with slow but safe and consistent ambulation in the room.  She used the walker but was not overly reliant on it.  O2 stayed in the mid 90s on room air during the effort.     Stairs            Wheelchair Mobility    Modified Rankin (Stroke Patients Only)       Balance Overall balance assessment: Modified Independent                                           Pertinent Vitals/Pain Pain Assessment: No/denies pain    Home Living Family/patient expects to be discharged to:: Private residence Living Arrangements: Children Available Help at Discharge: Family;Available 24 hours/day Type of Home: House Home Access: Stairs to enter Entrance Stairs-Rails: Doctor, general practiceight;Left Entrance Stairs-Number of Steps: 5 Home Layout: One level Home Equipment: Walker - 2 wheels;Walker - 4 wheels      Prior Function Level of Independence: Independent with assistive device(s)         Comments: amb with 2WW. no history of falls     Hand Dominance        Extremity/Trunk Assessment   Upper Extremity Assessment Upper Extremity Assessment: Generalized weakness    Lower Extremity Assessment Lower Extremity Assessment: Generalized weakness       Communication   Communication: No difficulties  Cognition Arousal/Alertness: Awake/alert Behavior During Therapy: WFL for tasks assessed/performed Overall Cognitive Status: Within Functional Limits for tasks assessed  General Comments      Exercises     Assessment/Plan    PT Assessment Patient needs continued PT services  PT Problem List Decreased strength;Decreased range of motion;Decreased activity tolerance;Decreased balance;Decreased coordination;Decreased mobility;Decreased knowledge of use of DME;Decreased safety awareness       PT Treatment Interventions DME instruction;Gait training;Stair training;Functional mobility training;Therapeutic activities;Therapeutic exercise;Balance training;Neuromuscular re-education;Patient/family education    PT Goals (Current goals can be found in the Care Plan section)  Acute Rehab PT  Goals Patient Stated Goal: go home when she is feeling a little better PT Goal Formulation: With patient Time For Goal Achievement: 04/28/18 Potential to Achieve Goals: Good    Frequency Min 2X/week   Barriers to discharge        Co-evaluation               AM-PAC PT "6 Clicks" Daily Activity  Outcome Measure Difficulty turning over in bed (including adjusting bedclothes, sheets and blankets)?: A Little Difficulty moving from lying on back to sitting on the side of the bed? : A Little Difficulty sitting down on and standing up from a chair with arms (e.g., wheelchair, bedside commode, etc,.)?: A Little Help needed moving to and from a bed to chair (including a wheelchair)?: None Help needed walking in hospital room?: A Little Help needed climbing 3-5 steps with a railing? : A Lot 6 Click Score: 18    End of Session Equipment Utilized During Treatment: Gait belt Activity Tolerance: Patient tolerated treatment well;Patient limited by fatigue Patient left: with chair alarm set;with call bell/phone within reach Nurse Communication: Mobility status PT Visit Diagnosis: Muscle weakness (generalized) (M62.81);Difficulty in walking, not elsewhere classified (R26.2)    Time: 1610-9604 PT Time Calculation (min) (ACUTE ONLY): 33 min   Charges:   PT Evaluation $PT Eval Low Complexity: 1 Low PT Treatments $Gait Training: 8-22 mins        Malachi Pro, DPT 04/14/2018, 4:47 PM

## 2018-04-14 NOTE — Care Management (Signed)
RNCM received notification from Maralyn SagoSarah with [facility] that patient is currently open to their services. Patient also [facility].

## 2018-04-15 LAB — BASIC METABOLIC PANEL
Anion gap: 6 (ref 5–15)
BUN: 32 mg/dL — AB (ref 8–23)
CALCIUM: 8.8 mg/dL — AB (ref 8.9–10.3)
CO2: 32 mmol/L (ref 22–32)
Chloride: 99 mmol/L (ref 98–111)
Creatinine, Ser: 1.68 mg/dL — ABNORMAL HIGH (ref 0.44–1.00)
GFR calc Af Amer: 32 mL/min — ABNORMAL LOW (ref >60)
GFR calc non Af Amer: 28 mL/min — ABNORMAL LOW (ref >60)
GLUCOSE: 124 mg/dL — AB (ref 70–99)
Potassium: 4 mmol/L (ref 3.5–5.1)
SODIUM: 137 mmol/L (ref 135–145)

## 2018-04-15 LAB — GLUCOSE, CAPILLARY
GLUCOSE-CAPILLARY: 160 mg/dL — AB (ref 70–99)
Glucose-Capillary: 119 mg/dL — ABNORMAL HIGH (ref 70–99)
Glucose-Capillary: 156 mg/dL — ABNORMAL HIGH (ref 70–99)
Glucose-Capillary: 190 mg/dL — ABNORMAL HIGH (ref 70–99)

## 2018-04-15 LAB — PROTIME-INR
INR: 1.29
PROTHROMBIN TIME: 16 s — AB (ref 11.4–15.2)

## 2018-04-15 MED ORDER — WARFARIN SODIUM 5 MG PO TABS
5.0000 mg | ORAL_TABLET | Freq: Once | ORAL | Status: AC
  Administered 2018-04-15: 5 mg via ORAL
  Filled 2018-04-15: qty 1

## 2018-04-15 MED ORDER — WARFARIN SODIUM 5 MG PO TABS
5.0000 mg | ORAL_TABLET | Freq: Once | ORAL | Status: DC
  Filled 2018-04-15: qty 1

## 2018-04-15 NOTE — Progress Notes (Signed)
Patient ID: Haley RiggerJean S Hill, female   DOB: January 26, 1941, 77 y.o.   MRN: [id number]   [employer] PROGRESS NOTE  Haley Hill [id number] DOB: January 26, 1941 DOA: 04/11/2018 PCP: Barbette ReichmannHande, Vishwanath, MD  HPI/Subjective: Patient feeling a little bit better.  No cough or shortness of breath.  No abdominal pain.  Still feels a little weak.  Objective: Vitals:   04/15/18 0734 04/15/18 1018  BP: 128/67 125/71  Pulse: 69 68  Resp: 20   Temp: 98.6 F (37 C)   SpO2: 96%     Filed Weights   04/11/18 2206 04/12/18 0500 04/14/18 1248  Weight: 95.3 kg 97.3 kg 95.2 kg    ROS: Review of Systems  Constitutional: Positive for malaise/fatigue. Negative for chills and fever.  Eyes: Negative for blurred vision.  Respiratory: Negative for cough and shortness of breath.   Cardiovascular: Negative for chest pain.  Gastrointestinal: Negative for abdominal pain, constipation, diarrhea, nausea and vomiting.  Genitourinary: Negative for dysuria.  Musculoskeletal: Negative for joint pain.  Neurological: Negative for dizziness and headaches.   Exam: Physical Exam  Constitutional: She is oriented to person, place, and time.  HENT:  Nose: No mucosal edema.  Mouth/Throat: No oropharyngeal exudate or posterior oropharyngeal edema.  Eyes: Pupils are equal, round, and reactive to light. Conjunctivae, EOM and lids are normal.  Neck: No JVD present. Carotid bruit is not present. No edema present. No thyroid mass and no thyromegaly present.  Cardiovascular: S1 normal and S2 normal. Exam reveals no gallop.  No murmur heard. Pulses:      Dorsalis pedis pulses are 2+ on the right side, and 2+ on the left side.  Respiratory: No respiratory distress. She has decreased breath sounds in the right lower field and the left lower field. She has no wheezes. She has no rhonchi. She has no rales.  GI: Soft. Bowel sounds are normal. There is no tenderness.  Musculoskeletal:       Right ankle: She exhibits swelling.        Left ankle: She exhibits swelling.  Lymphadenopathy:    She has no cervical adenopathy.  Neurological: She is alert and oriented to person, place, and time. No cranial nerve deficit.  Skin: Skin is warm. No rash noted. Nails show no clubbing.  Psychiatric: She has a normal mood and affect.      Data Reviewed: Basic Metabolic Panel: Recent Labs  Lab 04/11/18 2211 04/13/18 0426 04/14/18 0411 04/15/18 0351  NA 138 140 139 137  K 4.3 4.0 3.6 4.0  CL 98 104 103 99  CO2 30 31 31  32  GLUCOSE 255* 188* 79 124*  BUN 35* 37* 34* 32*  CREATININE 2.37* 2.07* 1.74* 1.68*  CALCIUM 9.6 9.0 8.8* 8.8*   Liver Function Tests: Recent Labs  Lab 04/11/18 2211 04/14/18 0411  AST 59* 29  ALT 26 17  ALKPHOS 110 59  BILITOT 1.9* 1.3*  PROT 7.1 5.4*  ALBUMIN 4.1 2.8*   CBC: Recent Labs  Lab 04/11/18 2211 04/13/18 0426 04/14/18 0411  WBC 8.5 7.6 6.2  NEUTROABS 7.4*  --   --   HGB 14.2 12.7 11.5*  HCT 41.2 36.6 33.1*  MCV 105.2* 105.3* 104.2*  PLT 72* 58* 58*    CBG: Recent Labs  Lab 04/14/18 1254 04/14/18 1634 04/14/18 2122 04/15/18 0736 04/15/18 1145  GLUCAP 107* 163* 150* 119* 156*    Recent Results (from the past 240 hour(s))  Blood culture (routine x 2)     Status: Abnormal  Collection Time: 04/11/18 10:11 PM  Result Value Ref Range Status   Specimen Description   Final    BLOOD LEFT ANTECUBITAL Performed at [facility], [address] [place], [place] [address]    Special Requests   Final    BOTTLES DRAWN AEROBIC AND ANAEROBIC Blood Culture results may not be optimal due to an excessive volume of blood received in culture bottles Performed at [facility], [address] [place], [place] [address]    Culture  Setup Time   Final    IN BOTH AEROBIC AND ANAEROBIC BOTTLES GRAM NEGATIVE RODS CRITICAL RESULT CALLED TO, READ BACK BY AND VERIFIED WITH: RODNEY GRUBB AT 1034 ON 04/12/2018 JJB Performed at [facility], [address] [place], [place] [address]    Culture (A)  Final    ESCHERICHIA COLI SUSCEPTIBILITIES PERFORMED ON PREVIOUS CULTURE WITHIN THE LAST 5 DAYS. Performed at [facility], [address] [place], [place] [address]    Report Status 04/14/2018 FINAL  Final  Blood culture (routine x 2)     Status: Abnormal   Collection Time: 04/11/18 10:31 PM  Result Value Ref Range Status   Specimen Description   Final    BLOOD RIGHT HAND Performed at [facility], [address], [place], [place] [address]    Special Requests   Final    BOTTLES DRAWN AEROBIC AND ANAEROBIC Blood Culture adequate volume Performed at [facility], [address] [place], [place] [address]    Culture  Setup Time   Final    GRAM NEGATIVE RODS IN BOTH AEROBIC AND ANAEROBIC BOTTLES CRITICAL RESULT CALLED TO, READ BACK BY AND VERIFIED WITH: RODNEY GRUBB AT 1034 ON 04/12/2018 JJB Performed at [facility], [address] [place], [place] [address]    Culture ESCHERICHIA COLI (A)  Final   Report Status 04/14/2018 FINAL  Final   Organism ID, Bacteria ESCHERICHIA COLI  Final      Susceptibility   Escherichia coli - MIC*    AMPICILLIN 4 SENSITIVE Sensitive     CEFAZOLIN <=4 SENSITIVE Sensitive     CEFEPIME <=1 SENSITIVE Sensitive     CEFTAZIDIME <=1 SENSITIVE Sensitive     CEFTRIAXONE <=1 SENSITIVE Sensitive     CIPROFLOXACIN <=0.25 SENSITIVE Sensitive     GENTAMICIN <=1 SENSITIVE Sensitive     IMIPENEM <=0.25 SENSITIVE Sensitive     TRIMETH/SULFA <=20 SENSITIVE Sensitive     AMPICILLIN/SULBACTAM <=2 SENSITIVE Sensitive     PIP/TAZO <=4 SENSITIVE Sensitive     Extended ESBL NEGATIVE Sensitive     * ESCHERICHIA COLI  Blood Culture ID Panel (Reflexed)     Status: Abnormal   Collection Time: 04/11/18 10:31 PM  Result Value Ref Range Status   Enterococcus species NOT DETECTED NOT DETECTED Final   Listeria monocytogenes NOT DETECTED NOT DETECTED Final   Staphylococcus species NOT DETECTED NOT  DETECTED Final   Staphylococcus aureus NOT DETECTED NOT DETECTED Final   Streptococcus species NOT DETECTED NOT DETECTED Final   Streptococcus agalactiae NOT DETECTED NOT DETECTED Final   Streptococcus pneumoniae NOT DETECTED NOT DETECTED Final   Streptococcus pyogenes NOT DETECTED NOT DETECTED Final   Acinetobacter baumannii NOT DETECTED NOT DETECTED Final   Enterobacteriaceae species DETECTED (A) NOT DETECTED Final    Comment: Enterobacteriaceae represent a large family of gram-negative bacteria, not a single organism. CRITICAL RESULT CALLED TO, READ BACK BY AND VERIFIED WITH: RODNEY GRUBB AT 1034 ON  04/12/2018 JJB    Enterobacter cloacae complex NOT DETECTED NOT DETECTED Final   Escherichia coli DETECTED (A) NOT DETECTED Final    Comment: CRITICAL RESULT CALLED TO, READ BACK BY AND VERIFIED WITH: RODNEY GRUBB AT 1034 ON 04/12/2018 JJB    Klebsiella oxytoca NOT DETECTED NOT DETECTED Final   Klebsiella pneumoniae NOT DETECTED NOT DETECTED Final   Proteus species NOT DETECTED NOT DETECTED Final   Serratia marcescens NOT DETECTED NOT DETECTED Final   Carbapenem resistance NOT DETECTED NOT DETECTED Final   Haemophilus influenzae NOT DETECTED NOT DETECTED Final   Neisseria meningitidis NOT DETECTED NOT DETECTED Final   Pseudomonas aeruginosa NOT DETECTED NOT DETECTED Final   Candida albicans NOT DETECTED NOT DETECTED Final   Candida glabrata NOT DETECTED NOT DETECTED Final   Candida krusei NOT DETECTED NOT DETECTED Final   Candida parapsilosis NOT DETECTED NOT DETECTED Final   Candida tropicalis NOT DETECTED NOT DETECTED Final    Comment: Performed at [facility], [address] [place], [place] [address]  Urine Culture     Status: Abnormal   Collection Time: 04/11/18 11:15 PM  Result Value Ref Range Status   Specimen Description   Final    URINE, RANDOM Performed at [facility], [address] [place], [place] [address]    Special Requests   Final     NONE Performed at [facility], [address] [place], [place] [address]    Culture >=100,000 COLONIES/mL ESCHERICHIA COLI (A)  Final   Report Status 04/14/2018 FINAL  Final   Organism ID, Bacteria ESCHERICHIA COLI (A)  Final      Susceptibility   Escherichia coli - MIC*    AMPICILLIN 4 SENSITIVE Sensitive     CEFAZOLIN <=4 SENSITIVE Sensitive     CEFTRIAXONE <=1 SENSITIVE Sensitive     CIPROFLOXACIN <=0.25 SENSITIVE Sensitive     GENTAMICIN <=1 SENSITIVE Sensitive     IMIPENEM <=0.25 SENSITIVE Sensitive     NITROFURANTOIN <=16 SENSITIVE Sensitive     TRIMETH/SULFA <=20 SENSITIVE Sensitive     AMPICILLIN/SULBACTAM <=2 SENSITIVE Sensitive     PIP/TAZO <=4 SENSITIVE Sensitive     Extended ESBL NEGATIVE Sensitive     * >=100,000 COLONIES/mL ESCHERICHIA COLI  MRSA PCR Screening     Status: None   Collection Time: 04/12/18  5:37 AM  Result Value Ref Range Status   MRSA by PCR NEGATIVE NEGATIVE Final    Comment:        The GeneXpert MRSA Assay (FDA approved for NASAL specimens only), is one component of a comprehensive MRSA colonization surveillance program. It is not intended to diagnose MRSA infection nor to guide or monitor treatment for MRSA infections. Performed at [facility], [address] [place], [place] [address]       Scheduled Meds: . allopurinol  100 mg Oral Daily  . calcitRIOL  0.25 mcg Oral Once per day on Mon Wed Fri  . cholecalciferol  2,000 Units Oral Daily  . enoxaparin (LOVENOX) injection  1 mg/kg Subcutaneous Q24H  . ferrous sulfate  325 mg Oral Q breakfast  . furosemide  20 mg Oral BID  . gabapentin  800 mg Oral Q8H  . insulin aspart  0-15 Units Subcutaneous TID WC  . insulin aspart  0-5 Units Subcutaneous QHS  . insulin aspart protamine- aspart  15 Units Subcutaneous BID WC  . isosorbide mononitrate  20 mg Oral Daily  . levothyroxine  112 mcg Oral QAC breakfast  .  mouth rinse  15 mL Mouth Rinse BID  . metoprolol tartrate   25 mg Oral BID  . pantoprazole  40 mg Oral Daily  . potassium chloride SA  20 mEq Oral Daily  . rOPINIRole  0.5 mg Oral QHS  . senna  1 tablet Oral Daily  . simvastatin  40 mg Oral QHS  . sodium chloride flush  10-40 mL Intracatheter Q12H  . vitamin B-12  5,000 mcg Oral Daily  . warfarin  5 mg Oral ONCE-1800  . Warfarin - Pharmacist Dosing Inpatient   Does not apply q1800   Continuous Infusions: . sodium chloride 10 mL/hr at 04/15/18 1508  . ampicillin (OMNIPEN) IV Stopped (04/15/18 1245)    Assessment/Plan:  1. E. coli sepsis, E. coli growing in both urine and blood cultures.  Meropenem switched to ampicillin yesterday. 2. Septic shock resolved.  Now off pressors and transferred out of the ICU. 3. Acute hypoxic respiratory failure.  Initially requiring BiPAP.  Now off oxygen. 4. Acute kidney injury on chronic kidney disease stage III. 5. Chronic atrial fibrillation on Coumadin and Lovenox until Coumadin level is better.  Metoprolol for rate control.  For some reason INR keeps on going down.  Pharmacy increase in Coumadin dose to higher than what she takes at home.  Check INR daily. 6. Type 2 diabetes mellitus on 70/30 insulin and sliding scale 7. Elevated liver function test.  Looking back at endoscopy done last year she did have grade 2 varices.  I will get an ultrasound of the liver to evaluate the liver a little bit further. 8. Chronic thrombocytopenia.  Ultrasound of the liver to see if she has cirrhosis.  Check a hepatitis C. 9. Hypothyroidism unspecified on levothyroxine 10. Hyperlipidemia unspecified on simvastatin 11. Weakness physical therapy evaluation appreciated.  Recommended home with home health.  Code Status:     Code Status Orders  (From admission, onward)         Start     Ordered   04/12/18 0443  Full code  Continuous     04/12/18 0442        Code Status History    Date Active Date Inactive Code Status Order ID Comments User Context   02/20/2018 2122  02/23/2018 1724 Full Code [id number]  Ihor Austin, MD Inpatient   04/29/2015 1726 05/02/2015 2124 Full Code [id number]  Adrian Saran, MD Inpatient   01/25/2015 1633 01/28/2015 1855 Full Code [id number]  Milagros Loll, MD ED     Family Communication: Spoke with son at the bedside yesterday Disposition Plan: Would like to see INR higher prior to disposition  Antibiotics:  Ampicillin  Time spent: 27 minutes  Raylynn Hersh [employer]

## 2018-04-15 NOTE — Progress Notes (Signed)
Notified MD Wieting of several 2 second pauses seen on telemetry. Patient asymptomatic at this time, not changes in VS.  New orders received, will continue to monitor closely.

## 2018-04-15 NOTE — Progress Notes (Signed)
Pharmacy Antibiotic Note  Fredrik RiggerJean S Coombes is a 77 y.o. female admitted on 04/11/2018 with sepsis secondary to UTI. BCx now showing pan-sensitive E. Coli. Patient will be switched from Meropenem to Ampicillin.  Plan: Continue Ampicillin 2g IV every 6 hours while inpatient. Recommend PO  Amoxicillin or Cephalexin at discharge.   Height: 5\' 3"  (160 cm) Weight: 209 lb 14.4 oz (95.2 kg) IBW/kg (Calculated) : 52.4  Temp (24hrs), Avg:98.9 F (37.2 C), Min:98.3 F (36.8 C), Max:100.3 F (37.9 C)  Recent Labs  Lab 04/11/18 2211 04/12/18 0107 04/13/18 0426 04/14/18 0411 04/15/18 0351  WBC 8.5  --  7.6 6.2  --   CREATININE 2.37*  --  2.07* 1.74* 1.68*  LATICACIDVEN 3.2* 2.5*  --   --   --     Estimated Creatinine Clearance: 29.8 mL/min (A) (by C-G formula based on SCr of 1.68 mg/dL (H)).    Allergies  Allergen Reactions  . Ciprofloxacin Itching and Rash  . Fosamax [Alendronate Sodium] Other (See Comments)    Reaction: Ulcers in mouth and esophagus  . Iron Other (See Comments)    Antimicrobials this admission: Cefepime 8/19  >>  8/20 Meropenem 8/20>> 8/22 Ampicillin 8/22>>  Microbiology results: 8/19 BCID: E.coli   8/19 UA: E.coli   Thank you for allowing pharmacy to be a part of this patient's care.   Mauri ReadingSavanna M Martin, PharmD Pharmacy Resident  04/15/2018 8:27 AM

## 2018-04-15 NOTE — Consult Note (Addendum)
ANTICOAGULATION CONSULT NOTE - Initial Consult  Pharmacy Consult for Warfarin and enoxaparin Dosing  Indication: atrial fibrillation  Allergies  Allergen Reactions  . Ciprofloxacin Itching and Rash  . Fosamax [Alendronate Sodium] Other (See Comments)    Reaction: Ulcers in mouth and esophagus  . Iron Other (See Comments)   Patient Measurements: Height: 5\' 3"  (160 cm) Weight: 209 lb 14.4 oz (95.2 kg) IBW/kg (Calculated) : 52.4 Vital Signs: Temp: 98.6 F (37 C) (08/23 0734) Temp Source: Oral (08/23 0734) BP: 128/67 (08/23 0734) Pulse Rate: 69 (08/23 0734)  Labs: Recent Labs    04/13/18 0426 04/14/18 0411 04/15/18 0351  HGB 12.7 11.5*  --   HCT 36.6 33.1*  --   PLT 58* 58*  --   LABPROT 20.3* 17.6* 16.0*  INR 1.75 1.46 1.29  CREATININE 2.07* 1.74* 1.68*    Estimated Creatinine Clearance: 29.8 mL/min (A) (by C-G formula based on SCr of 1.68 mg/dL (H)).  Medical History: Past Medical History:  Diagnosis Date  . Atrial fibrillation (HCC)   . Atrial flutter (HCC)   . CHF (congestive heart failure) (HCC)   . CKD (chronic kidney disease) stage 3, GFR 30-59 ml/min (HCC)   . Coronary disease   . Dehydration   . Diabetes (HCC)   . Fracture of humeral shaft, right, closed   . Gout   . Hypertension   . Hypothyroidism   . Peripheral neuropathy   . Renal insufficiency   . Right renal artery stenosis (HCC)   . Syncope and collapse   . Vitiligo     Assessment: Pharmacy consulted for warfarin dosing and lab monitoring in 77 yo female admitted with sepsis. Patient had PMH of A. Fib and had a therapeutic INR on admission @ 2.16.   Home Regimen: Warfarin 2mg  M,Tu,W,Th,F                              Warfarin 1mg  Su,Sa  DATE INR DOSE 8/19 2.16     - 8/20 1.98 2mg  8/21 1.75 3mg  8/22 1.46 4 mg 0823 1.29 5 mg  Goal of Therapy:  INR 2-3 Monitor platelets by anticoagulation protocol: Yes   Plan:  1. INR still trending down, patient did miss dose on 8/19. Will order  Warfarin 5mg  x 1 dose today.  INRs ordered daily while on antibiotics per protocol.   2. Continue Enoxaparin 1mg /kg (95mg ) every 24 hours until INR therapeutic x 2 based on CrCl < 5930ml./min.   Patient had chronic thrombocytopenia. Plts: 58 x 2days. No S/S of bleeding, but there is some bruising that has been there a while per nurse. Pharmacy will continue to follow daily and adjust dose as needed.    Mauri ReadingSavanna M Collen Hill, PharmD Pharmacy Resident  04/15/2018 8:04 AM

## 2018-04-15 NOTE — Plan of Care (Signed)
  Problem: Pain Managment: Goal: General experience of comfort will improve 04/15/2018 0317 by Stefan Churchogers, Chelby Salata M, RN Outcome: Progressing 04/14/2018 2012 by Stefan Churchogers, Nakeita Styles M, RN Outcome: Progressing   Problem: Skin Integrity: Goal: Risk for impaired skin integrity will decrease Outcome: Progressing

## 2018-04-15 NOTE — Progress Notes (Addendum)
RN received a call from patient's daughter-in-law with concerns that she and the patient's son have about the patient ability to return home safely with only Home health PT. They stated that they would feel much more comfortable with her returning to rehab before returning home.   RN call PT to see if they would possibly re-evaluate the patient, PT Preston CallasGalen stated that he would call around to his team to see if someone could see her again today.   Patient required two person assist to ambulate from the bed to the chair, and states she feels very weak. Will continue to monitor and pass on information.

## 2018-04-15 NOTE — Progress Notes (Signed)
Physical Therapy Treatment Patient Details Name: Haley Hill MRN: [id number] DOB: 26-Aug-1940 Today's Date: 04/15/2018    History of Present Illness 77 y.o. female with a known history of T2IDDM, HTN, CKD III, Afib (Coumadin), recurrent UTI here with generalized weakness, chills, confusion, fall.  Admitte4d with sepsis/UTI.    PT Comments    Pt is progressing towards goals today, but does require increased need for physical assist. PT Monitored HR, O2 saturation and RPE throughout session and all remained [facility] during session. Pt was able to transfer sit<>stand with Min A, and ambulate in room with CGA and Chair follow for safety, see below for mobility details. Bed mobility was deferred 2/2 pt preference. Pt needs increased time and multimodal cueing for all tasks to remain safe. Pt needed increased physical assist for stand 2/2 to weakness. Pt was fatigued with activity, but felt fine once returned to recliner. PT is changing d/c recommendation to SNF 2/2 to increased need of physical assist for functional mobility.   Follow Up Recommendations  SNF     Equipment Recommendations  None recommended by PT    Recommendations for Other Services       Precautions / Restrictions Precautions Precautions: Fall Restrictions Weight Bearing Restrictions: No    Mobility  Bed Mobility Overal bed mobility: (deferred )             General bed mobility comments: pt found in recliner and prefrence to return to recliner  Transfers Overall transfer level: Needs assistance Equipment used: Rolling walker (2 wheeled) Transfers: Sit to/from Stand Sit to Stand: Min assist         General transfer comment: Pt required Min A at hips and R ant knee 2/2 to weakness. Pt stands with flewd hips and trunk that does improve with tactile cueing, but returns with ambulation  Ambulation/Gait Ambulation/Gait assistance: Min guard Gait Distance (Feet): 10 Feet Assistive device: Rolling walker (2  wheeled) Gait Pattern/deviations: Step-to pattern;Decreased step length - left     General Gait Details: Pt required CGA with ambualtion 2/2 unsafe usage of RW and forward flexed postition as well as weakness. Pt benefited from multimodal cueing for safe use of RW and pt benefited form inceased time.   Stairs             Wheelchair Mobility    Modified Rankin (Stroke Patients Only)       Balance Overall balance assessment: Needs assistance Sitting-balance support: No upper extremity supported;Feet supported Sitting balance-Leahy Scale: Fair     Standing balance support: Bilateral upper extremity supported Standing balance-Leahy Scale: Poor                              Cognition Arousal/Alertness: Awake/alert Behavior During Therapy: WFL for tasks assessed/performed Overall Cognitive Status: Within Functional Limits for tasks assessed                                        Exercises Other Exercises Other Exercises: pt insructed in seated there.ex to include ankle pumps x10, heel slides x10, hip abd/add x10, and SLR x10 all on B LEs.    General Comments        Pertinent Vitals/Pain Pain Assessment: No/denies pain    Home Living  Prior Function            PT Goals (current goals can now be found in the care plan section) Acute Rehab PT Goals Patient Stated Goal: go home  PT Goal Formulation: With patient Time For Goal Achievement: 04/28/18 Potential to Achieve Goals: Good Progress towards PT goals: Progressing toward goals    Frequency    Min 2X/week      PT Plan Discharge plan needs to be updated    Co-evaluation              AM-PAC PT "6 Clicks" Daily Activity  Outcome Measure  Difficulty turning over in bed (including adjusting bedclothes, sheets and blankets)?: A Lot Difficulty moving from lying on back to sitting on the side of the bed? : A Lot Difficulty sitting down on  and standing up from a chair with arms (e.g., wheelchair, bedside commode, etc,.)?: Unable Help needed moving to and from a bed to chair (including a wheelchair)?: A Little Help needed walking in hospital room?: A Little Help needed climbing 3-5 steps with a railing? : A Lot 6 Click Score: 13    End of Session Equipment Utilized During Treatment: Gait belt Activity Tolerance: Patient limited by fatigue Patient left: in chair;with call bell/phone within reach;with chair alarm set Nurse Communication: Mobility status PT Visit Diagnosis: Muscle weakness (generalized) (M62.81);Difficulty in walking, not elsewhere classified (R26.2)     Time: [id number] PT Time Calculation (min) (ACUTE ONLY): 23 min  Charges:                        Renaldo HarrisonStephen Karry Causer, SPT    Renaldo HarrisonStephen Britton Perkinson 04/15/2018, 5:30 PM

## 2018-04-15 NOTE — Progress Notes (Signed)
Pharmacy Antibiotic Note  Fredrik RiggerJean S Wrede is a 77 y.o. female admitted on 04/11/2018 with sepsis secondary to UTI. BCx now showing pan-sensitive E. Coli. Patient will be switched from Meropenem to Ampicillin.  Plan: Continue Ampicillin 2g IV every 6 hours while inpatient. Recommend PO  Amoxicillin at discharge.   Height: 5\' 3"  (160 cm) Weight: 209 lb 14.4 oz (95.2 kg) IBW/kg (Calculated) : 52.4  Temp (24hrs), Avg:98.9 F (37.2 C), Min:98.3 F (36.8 C), Max:100.3 F (37.9 C)  Recent Labs  Lab 04/11/18 2211 04/12/18 0107 04/13/18 0426 04/14/18 0411 04/15/18 0351  WBC 8.5  --  7.6 6.2  --   CREATININE 2.37*  --  2.07* 1.74* 1.68*  LATICACIDVEN 3.2* 2.5*  --   --   --     Estimated Creatinine Clearance: 29.8 mL/min (A) (by C-G formula based on SCr of 1.68 mg/dL (H)).    Allergies  Allergen Reactions  . Ciprofloxacin Itching and Rash  . Fosamax [Alendronate Sodium] Other (See Comments)    Reaction: Ulcers in mouth and esophagus  . Iron Other (See Comments)    Antimicrobials this admission: Cefepime 8/19  >>  8/20 Meropenem 8/20>> 8/22 Ampicillin 8/22>>  Microbiology results: 8/19 BCID: E.coli   8/19 UA: E.coli   Thank you for allowing pharmacy to be a part of this patient's care.   Mauri ReadingSavanna M Martin, PharmD Pharmacy Resident  04/15/2018 1:47 PM

## 2018-04-15 NOTE — Care Management Important Message (Signed)
Important Message  Patient Details  Name: Haley Hill MRN: [id number] Date of Birth: Oct 22, 1940   Medicare Important Message Given:  Yes    Olegario MessierKathy A Ludwika Rodd 04/15/2018, 11:43 AM

## 2018-04-16 ENCOUNTER — Inpatient Hospital Stay: Payer: Medicare Other

## 2018-04-16 LAB — GLUCOSE, CAPILLARY
GLUCOSE-CAPILLARY: 151 mg/dL — AB (ref 70–99)
Glucose-Capillary: 182 mg/dL — ABNORMAL HIGH (ref 70–99)
Glucose-Capillary: 173 mg/dL — ABNORMAL HIGH (ref 70–99)
Glucose-Capillary: 159 mg/dL — ABNORMAL HIGH (ref 70–99)

## 2018-04-16 LAB — CBC
HCT: 33.8 % — ABNORMAL LOW (ref 35.0–47.0)
HEMOGLOBIN: 11.8 g/dL — AB (ref 12.0–16.0)
MCH: 36.7 pg — AB (ref 26.0–34.0)
MCHC: 35 g/dL (ref 32.0–36.0)
MCV: 105 fL — ABNORMAL HIGH (ref 80.0–100.0)
Platelets: 72 10*3/uL — ABNORMAL LOW (ref 150–440)
RBC: 3.22 MIL/uL — ABNORMAL LOW (ref 3.80–5.20)
RDW: 14.4 % (ref 11.5–14.5)
WBC: 5.1 10*3/uL (ref 3.6–11.0)

## 2018-04-16 LAB — PROTIME-INR
INR: 1.53
Prothrombin Time: 18.3 seconds — ABNORMAL HIGH (ref 11.4–15.2)

## 2018-04-16 MED ORDER — WARFARIN SODIUM 2 MG PO TABS
2.0000 mg | ORAL_TABLET | Freq: Once | ORAL | Status: DC
  Filled 2018-04-16: qty 1

## 2018-04-16 MED ORDER — WARFARIN SODIUM 3 MG PO TABS
3.0000 mg | ORAL_TABLET | Freq: Once | ORAL | Status: AC
  Administered 2018-04-16: 3 mg via ORAL
  Filled 2018-04-16: qty 1

## 2018-04-16 MED ORDER — AMOXICILLIN 500 MG PO CAPS
1000.0000 mg | ORAL_CAPSULE | Freq: Three times a day (TID) | ORAL | Status: DC
  Administered 2018-04-16 – 2018-04-18 (×7): 1000 mg via ORAL
  Filled 2018-04-16 (×9): qty 2

## 2018-04-16 MED ORDER — CEPHALEXIN 250 MG PO CAPS
250.0000 mg | ORAL_CAPSULE | Freq: Three times a day (TID) | ORAL | Status: DC
  Filled 2018-04-16 (×2): qty 1

## 2018-04-16 NOTE — Progress Notes (Signed)
[employer] [facility] - [place] [facility]   PATIENT NAME: Haley Hill    MR#:  [id number]  DATE OF BIRTH:  1940/10/10  SUBJECTIVE:  CHIEF COMPLAINT:   Chief Complaint  Patient presents with  . Fall   -Patient admitted with weakness and falls.  Noted to have E. coli sepsis.  REVIEW OF SYSTEMS:  Review of Systems  Constitutional: Positive for malaise/fatigue. Negative for chills and fever.  HENT: Positive for hearing loss. Negative for congestion, ear discharge and nosebleeds.   Eyes: Negative for blurred vision and double vision.  Respiratory: Negative for cough, shortness of breath and wheezing.   Cardiovascular: Negative for chest pain, palpitations and leg swelling.  Gastrointestinal: Negative for abdominal pain, constipation, diarrhea, nausea and vomiting.  Genitourinary: Negative for dysuria.  Musculoskeletal: Negative for myalgias.  Neurological: Positive for weakness. Negative for dizziness, seizures and headaches.  Psychiatric/Behavioral: Negative for depression.    DRUG ALLERGIES:   Allergies  Allergen Reactions  . Ciprofloxacin Itching and Rash  . Fosamax [Alendronate Sodium] Other (See Comments)    Reaction: Ulcers in mouth and esophagus  . Iron Other (See Comments)    VITALS:  Blood pressure 119/74, pulse (!) 52, temperature 98.4 F (36.9 C), temperature source Oral, resp. rate 18, height 5\' 3"  (1.6 m), weight 95.2 kg, SpO2 97 %.  PHYSICAL EXAMINATION:  Physical Exam  GENERAL:  77 y.o.-year-old patient lying in the bed with no acute distress.  EYES: Pupils equal, round, reactive to light and accommodation. No scleral icterus. Extraocular muscles intact.  HEENT: Head atraumatic, normocephalic. Oropharynx and nasopharynx clear.  NECK:  Supple, no jugular venous distention. No thyroid enlargement, no tenderness.  LUNGS: Normal breath sounds bilaterally, no wheezing, rales,rhonchi or crepitation. No use of accessory muscles of respiration.   Decreased bibasilar breath sounds CARDIOVASCULAR: S1, S2 normal. No  rubs, or gallops.  2/6 systolic murmur is noted ABDOMEN: Soft, nontender, nondistended. Bowel sounds present. No organomegaly or mass.  EXTREMITIES: No pedal edema, cyanosis, or clubbing.  NEUROLOGIC: Cranial nerves II through XII are intact. Muscle strength 5/5 in all extremities. Sensation intact. Gait not checked.  Global weakness noted PSYCHIATRIC: The patient is alert and oriented x 3.  SKIN: No obvious rash, lesion, or ulcer.    LABORATORY PANEL:   CBC Recent Labs  Lab 04/16/18 0521  WBC 5.1  HGB 11.8*  HCT 33.8*  PLT 72*   ------------------------------------------------------------------------------------------------------------------  Chemistries  Recent Labs  Lab 04/14/18 0411 04/15/18 0351  NA 139 137  K 3.6 4.0  CL 103 99  CO2 31 32  GLUCOSE 79 124*  BUN 34* 32*  CREATININE 1.74* 1.68*  CALCIUM 8.8* 8.8*  AST 29  --   ALT 17  --   ALKPHOS 59  --   BILITOT 1.3*  --    ------------------------------------------------------------------------------------------------------------------  Cardiac Enzymes No results for input(s): TROPONINI in the last 168 hours. ------------------------------------------------------------------------------------------------------------------  RADIOLOGY:  US Abdomen Limited Ruq  Result Date: 04/16/2018 CLINICAL DATA:  Abdominal pain over the last week. EXAM: ULTRASOUND ABDOMEN LIMITED RIGHT UPPER QUADRANT COMPARISON:  04/12/2018.  10/08/2016. FINDINGS: Gallbladder: Mobile stones and sludge in the gallbladder. No Murphy sign. Wall thickness is normal. No surrounding fluid. Largest stone is 7 mm. Common bile duct: Diameter: 4 mm common normal Liver: Echogenic liver with nodular surface consistent with cirrhosis. No focal lesion or ductal dilatation. Portal vein is patent on color Doppler imaging with normal direction of blood flow towards the liver. IMPRESSION:  Mobile stones  and sludge in the gallbladder. Largest stone 7 mm. No sonographic evidence cholecystitis, obstruction or ductal dilatation. Chronic findings of cirrhosis. Increased echogenicity. Nodular surface. No focal lesion. Electronically Signed   By: Paulina FusiMark  Shogry M.D.   On: 04/16/2018 09:24    EKG:   Orders placed or performed during the hospital encounter of 04/11/18  . EKG 12-Lead  . EKG 12-Lead  . EKG 12-Lead  . EKG 12-Lead    ASSESSMENT AND PLAN:   77 year old female with past medical history significant for A. fib on Coumadin, CKD age 89, CHF, hypertension was brought in from home secondary to sepsis.  1.  Sepsis-E. coli sepsis with E. coli growing in blood and urine cultures. -Initially required vasopressors, off pressors now.  Blood pressure is stable. -Received IV antibiotics-changed to  oral antibiotics at this time -Treat for 14 days total.  2.  Acute renal failure on CKD stage III-Baseline creatinine seems to be around 1.7. -Secondary to sepsis and ATN. -Improved creatinine at this time  3.  Hypothyroidism-continue Synthroid  4.  Hypertension-on Imdur.  5.  Diabetes mellitus-continue sliding scale insulin and NovoLog 70/30 twice daily  6.  Paroxysmal atrial fibrillation-metoprolol on hold as bradycardic.  Continue Coumadin for anticoagulation.  Pharmacy adjusting the dose - being bridged with lovenox  PT recommended SNF- social worker consulted   All the records are reviewed and case discussed with Care Management/Social Workerr. Management plans discussed with the patient, family and they are in agreement.  CODE STATUS: Full code  TOTAL TIME TAKING CARE OF THIS PATIENT: 36 minutes.   POSSIBLE D/C IN 1-2 DAYS, DEPENDING ON CLINICAL CONDITION.   Enid BaasKALISETTI,Angelle Isais M.D on 04/16/2018 at 9:48 AM  Between 7am to 6pm - Pager - [phone]  After 6pm go to [email or web address] - Social research officer, governmentpassword EPAS [facility]  [facility]  Office  [phone]  CC: Primary  care physician; Barbette ReichmannHande, Vishwanath, MD

## 2018-04-16 NOTE — Progress Notes (Addendum)
Consult placed to check hematoma that developed after PICC placed.(8/20) Coumadin and Lovonox started 8/21. Per patient's nurse, Wylene MenLacey, RN, patient is chronic a. Fib and thrombocytopenic at baseline. Morning labs indicate: Platelets 72K, PT and APTT elevated. Instructed nurse to elevated extremity, hold pressure for approximately 30 minutes and apply pressure dressing. Wylene MenLacey, RN to contact primary physician for possible interventions, states area is soft to touch, and may discontinue the PICC if not needed. Wylene MenLacey, RN states that she will follow up with this nurse.

## 2018-04-16 NOTE — Plan of Care (Signed)
  Problem: Activity: Goal: Risk for activity intolerance will decrease Outcome: Progressing   

## 2018-04-16 NOTE — Progress Notes (Signed)
MD Nemiah CommanderKalisetti made aware of large area of brusing to right arm around PICC site. New order to remove the PICC received, Charge RN to pull PICC. Right arm site marked and will be monitored closely. Area is soft to touch and right are elevated per IV team recommendations.

## 2018-04-16 NOTE — Consult Note (Addendum)
ANTICOAGULATION CONSULT NOTE - Initial Consult  Pharmacy Consult for Warfarin and enoxaparin Dosing  Indication: atrial fibrillation  Allergies  Allergen Reactions  . Ciprofloxacin Itching and Rash  . Fosamax [Alendronate Sodium] Other (See Comments)    Reaction: Ulcers in mouth and esophagus  . Iron Other (See Comments)   Patient Measurements: Height: 5\' 3"  (160 cm) Weight: 209 lb 14.4 oz (95.2 kg) IBW/kg (Calculated) : 52.4 Vital Signs: Temp: 98.6 F (37 C) (08/24 0339) Temp Source: Oral (08/24 0339) BP: 106/68 (08/24 0339) Pulse Rate: 58 (08/24 0339)  Labs: Recent Labs    04/14/18 0411 04/15/18 0351 04/16/18 0521  HGB 11.5*  --  11.8*  HCT 33.1*  --  33.8*  PLT 58*  --  72*  LABPROT 17.6* 16.0* 18.3*  INR 1.46 1.29 1.53  CREATININE 1.74* 1.68*  --     Estimated Creatinine Clearance: 29.8 mL/min (A) (by C-G formula based on SCr of 1.68 mg/dL (H)).  Medical History: Past Medical History:  Diagnosis Date  . Atrial fibrillation (HCC)   . Atrial flutter (HCC)   . CHF (congestive heart failure) (HCC)   . CKD (chronic kidney disease) stage 3, GFR 30-59 ml/min (HCC)   . Coronary disease   . Dehydration   . Diabetes (HCC)   . Fracture of humeral shaft, right, closed   . Gout   . Hypertension   . Hypothyroidism   . Peripheral neuropathy   . Renal insufficiency   . Right renal artery stenosis (HCC)   . Syncope and collapse   . Vitiligo     Assessment: Pharmacy consulted for warfarin dosing and lab monitoring in 77 yo female admitted with sepsis. Patient had PMH of A. Fib and had a therapeutic INR on admission @ 2.16.   Home Regimen: Warfarin 2mg  M,Tu,W,Th,F                              Warfarin 1mg  Su,Sa  DATE INR DOSE 8/19 2.16     - 8/20 1.98 2mg  8/21 1.75 3mg  8/22 1.46 4 mg 0823 1.29 5 mg 0824    1.53  Goal of Therapy:  INR 2-3 Monitor platelets by anticoagulation protocol: Yes   Plan:  1. INR still trending back up. I will give 3mg  tonight  and then resume normal home dose tomorrow INRs ordered daily while on antibiotics per protocol.   2. Continue Enoxaparin 1mg /kg (95mg ) every 24 hours until INR therapeutic x 2 based on CrCl < 830ml./min.   Patient has chronic thrombocytopenia. Plts:72 today.   Pharmacy will continue to follow daily and adjust dose as needed.   Olene FlossMelissa D Tujuana Kilmartin, Pharm.D, BCPS Clinical Pharmacist 04/16/2018 7:50 AM

## 2018-04-16 NOTE — Clinical Social Work Note (Signed)
Clinical Social Work Assessment  Patient Details  Name: Haley Hill MRN: [id number] Date of Birth: 09/07/1940  Date of referral:  04/16/18               Reason for consult:  Facility Placement                Permission sought to share information with:  Facility Contact Representative Permission granted to share information::  Yes, Verbal Permission Granted  Name::        Agency::  [place] area SNFs  Relationship::     Contact Information:     Housing/Transportation Living arrangements for the past 2 months:  Single Family Home Source of Information:  Patient, Medical Team, Adult Children Patient Interpreter Needed:  None Criminal Activity/Legal Involvement Pertinent to Current Situation/Hospitalization:  No - Comment as needed Significant Relationships:  Adult Children, Community Support, Other Family Members Lives with:  Adult Children Do you feel safe going back to the place where you live?  Yes Need for family participation in patient care:  No (Coment)  Care giving concerns:  PT elevated recommendation to SNF    Social Worker assessment / plan:  CSW met with the patient and her family at bedside to discuss discharge planning. The patient and her family provided verbal permission to begin the referral, and they named [facility] as the preference. The CSW explained that [employer] Medicare would need to provide prior authorization. The family and patient had no additional questions. At baseline, the patient lives with her son and DIL.  The CSW has begun the referral process and will follow up with bed offers. The CSW will follow to facilitate discharge.   Employment status:  Retired Insurance information:  Managed Medicare PT Recommendations:  Skilled Nursing Facility Information / Referral to community resources:  Skilled Nursing Facility  Patient/Family's Response to care:  The patient and the family thanked the CSW.  Patient/Family's Understanding of and Emotional  Response to Diagnosis, Current Treatment, and Prognosis:  The patient and her family understand the change in recommendation and are in agreement with the current discharge plan to SNF when medically stable.  Emotional Assessment Appearance:  Appears stated age Attitude/Demeanor/Rapport:  Gracious Affect (typically observed):  Pleasant Orientation:  Oriented to Self, Oriented to Place, Oriented to  Time, Oriented to Situation Alcohol / Substance use:  Never Used Psych involvement (Current and /or in the community):  No (Comment)  Discharge Needs  Concerns to be addressed:  Care Coordination, Discharge Planning Concerns Readmission within the last 30 days:  No Current discharge risk:  Chronically ill, Physical Impairment Barriers to Discharge:  Continued Medical Work up    M Cline, LCSW 04/16/2018, 4:33 PM  

## 2018-04-16 NOTE — [place] FL2 (Signed)
[place] MEDICAID FL2 LEVEL OF CARE SCREENING TOOL     IDENTIFICATION  Patient Name: Haley Hill Birthdate: [birth year] Sex: female Admission Date (Current Location): 04/11/2018  [place] and [place] Number:  Chiropodist and Address:  [facility], [address] [place], [place] [address]      Provider Number: [id number]  Attending Physician Name and Address:  Enid Baas, MD  Relative Name and Phone Number:  Malasia Torain (DIL) [phone] or Aubriauna Riner [facility]) [phone]    Current Level of Care: Hospital Recommended Level of Care: Skilled Nursing Facility Prior Approval Number:    Date Approved/Denied:   PASRR Number: [id number]  Discharge Plan: SNF    Current Diagnoses: Patient Active Problem List   Diagnosis Date Noted  . Sepsis secondary to UTI (HCC) 04/12/2018  . Acute hypoxemic respiratory failure (HCC) 04/12/2018  . UTI (urinary tract infection) 02/20/2018  . Hypoxia 04/29/2015  . Chronic kidney disease (CKD), stage III (moderate) (HCC) 03/07/2015  . ARF (acute renal failure) (HCC) 01/25/2015  . Fracture of humeral shaft, right, closed 01/25/2015  . Acute renal failure (HCC) 01/25/2015  . Closed fracture of humerus, shaft 01/25/2015  . Congestive heart failure (HCC) 09/25/2014  . AI (aortic incompetence) 02/07/2014  . Edema leg 02/07/2014  . History of cardiac catheterization 02/07/2014  . Renal artery stenosis (HCC) 02/07/2014  . H/O coronary artery bypass surgery 02/07/2014  . Atrial fibrillation (HCC) 01/04/2014  . Arteriosclerosis of coronary artery 01/04/2014  . Type 2 diabetes mellitus (HCC) 01/04/2014  . BP (high blood pressure) 01/04/2014  . HLD (hyperlipidemia) 01/04/2014  . Adult hypothyroidism 01/04/2014  . Neuropathy 01/04/2014    Orientation RESPIRATION BLADDER Height & Weight     Self, Time, Situation, Place  Normal Continent Weight: 209 lb 14.4 oz (95.2 kg) Height:  5\' 3"  (160 cm)   BEHAVIORAL SYMPTOMS/MOOD NEUROLOGICAL BOWEL NUTRITION STATUS      Continent Diet(Renal/Carb modified)  AMBULATORY STATUS COMMUNICATION OF NEEDS Skin   Extensive Assist Verbally Bruising                       Personal Care Assistance Level of Assistance  Bathing, Feeding, Dressing Bathing Assistance: Limited assistance Feeding assistance: Independent Dressing Assistance: Limited assistance     Functional Limitations Info  Sight, Hearing, Speech Sight Info: Adequate Hearing Info: Adequate Speech Info: Adequate    SPECIAL CARE FACTORS FREQUENCY  PT (By licensed PT)     PT Frequency: Up to 5X per week              Contractures Contractures Info: Not present    Additional Factors Info  Code Status, Allergies, Insulin Sliding Scale Code Status Info: Full Allergies Info:  Ciprofloxacin, Fosamax Alendronate Sodium, Iron   Insulin Sliding Scale Info: novolog: 0-15 units TID with meals AND 0-5 units QHS       Current Medications (04/16/2018):  This is the current hospital active medication list Current Facility-Administered Medications  Medication Dose Route Frequency Provider Last Rate Last Dose  . 0.9 %  sodium chloride infusion   Intravenous PRN Conforti, John, DO 10 mL/hr at 04/16/18 0847    . acetaminophen (TYLENOL) tablet 650 mg  650 mg Oral Q6H PRN Barbaraann Rondo, MD   650 mg at 04/12/18 0941   Or  . acetaminophen (TYLENOL) suppository 650 mg  650 mg Rectal Q6H PRN Barbaraann Rondo, MD      . allopurinol (ZYLOPRIM) tablet 100 mg  100  mg Oral Daily Conforti, John, DO   100 mg at 04/16/18 1029  . amoxicillin (AMOXIL) capsule 1,000 mg  1,000 mg Oral Q8H Enid Baas, MD   1,000 mg at 04/16/18 1420  . bisacodyl (DULCOLAX) EC tablet 5 mg  5 mg Oral Daily PRN Barbaraann Rondo, MD   5 mg at 04/16/18 1028  . calcitRIOL (ROCALTROL) capsule 0.25 mcg  0.25 mcg Oral Once per day on Mon Wed Fri Sridharan, Prasanna, MD   0.25 mcg at 04/15/18 1020  .  cholecalciferol (VITAMIN D) tablet 2,000 Units  2,000 Units Oral Daily Barbaraann Rondo, MD   2,000 Units at 04/16/18 1029  . ferrous sulfate tablet 325 mg  325 mg Oral Q breakfast Barbaraann Rondo, MD   325 mg at 04/16/18 1610  . furosemide (LASIX) tablet 20 mg  20 mg Oral BID Barbaraann Rondo, MD   20 mg at 04/16/18 9604  . gabapentin (NEURONTIN) capsule 800 mg  800 mg Oral Q8H Sridharan, Prasanna, MD   800 mg at 04/16/18 1422  . HYDROcodone-acetaminophen (NORCO/VICODIN) 5-325 MG per tablet 1 tablet  1 tablet Oral BID PRN Barbaraann Rondo, MD   1 tablet at 04/15/18 2127  . insulin aspart (novoLOG) injection 0-15 Units  0-15 Units Subcutaneous TID WC Barbaraann Rondo, MD   3 Units at 04/16/18 1205  . insulin aspart (novoLOG) injection 0-5 Units  0-5 Units Subcutaneous QHS Barbaraann Rondo, MD   2 Units at 04/12/18 2148  . insulin aspart protamine- aspart (NOVOLOG MIX 70/30) injection 15 Units  15 Units Subcutaneous BID WC Mayo, Allyn Kenner, MD   15 Units at 04/16/18 [phone]  . isosorbide mononitrate (ISMO,MONOKET) tablet 20 mg  20 mg Oral Daily Barbaraann Rondo, MD   20 mg at 04/16/18 1421  . levothyroxine (SYNTHROID, LEVOTHROID) tablet 112 mcg  112 mcg Oral QAC breakfast Barbaraann Rondo, MD   112 mcg at 04/16/18 0836  . MEDLINE mouth rinse  15 mL Mouth Rinse BID Alford Highland, MD   15 mL at 04/16/18 1030  . ondansetron (ZOFRAN) tablet 4 mg  4 mg Oral Q6H PRN Barbaraann Rondo, MD       Or  . ondansetron (ZOFRAN) injection 4 mg  4 mg Intravenous Q6H PRN Barbaraann Rondo, MD      . pantoprazole (PROTONIX) EC tablet 40 mg  40 mg Oral Daily Barbaraann Rondo, MD   40 mg at 04/16/18 1028  . potassium chloride SA (K-DUR,KLOR-CON) CR tablet 20 mEq  20 mEq Oral Daily Barbaraann Rondo, MD   20 mEq at 04/16/18 1028  . rOPINIRole (REQUIP) tablet 0.5 mg  0.5 mg Oral QHS Barbaraann Rondo, MD   0.5 mg at 04/15/18 2127  . senna (SENOKOT) tablet 8.6 mg  1 tablet Oral Daily  Conforti, John, DO   8.6 mg at 04/16/18 1028  . senna-docusate (Senokot-S) tablet 1 tablet  1 tablet Oral QHS PRN Barbaraann Rondo, MD      . simvastatin (ZOCOR) tablet 40 mg  40 mg Oral QHS Barbaraann Rondo, MD   40 mg at 04/15/18 2127  . sodium chloride flush (NS) 0.9 % injection 10-40 mL  10-40 mL Intracatheter Q12H Mayo, Allyn Kenner, MD   10 mL at 04/16/18 1030  . sodium chloride flush (NS) 0.9 % injection 10-40 mL  10-40 mL Intracatheter PRN Mayo, Allyn Kenner, MD      . vitamin B-12 (CYANOCOBALAMIN) tablet 5,000 mcg  5,000 mcg Oral Daily Barbaraann Rondo, MD   5,000 mcg at 04/16/18 1027  .  warfarin (COUMADIN) tablet 3 mg  3 mg Oral ONCE-1800 Olene FlossMaccia, Melissa D, RPH      . Warfarin - Pharmacist Dosing Inpatient   Does not apply q1800 Gardner CandleHallaji, Sheema M, [facility]         Discharge Medications: Please see discharge summary for a list of discharge medications.  Relevant Imaging Results:  Relevant Lab Results:   Additional Information SS#[id number]  Judi CongKaren M Verno, LCSW

## 2018-04-16 NOTE — Clinical Social Work Note (Signed)
CSW received consult that patient's PT recommendation has been elevated from HHPT to SNF. CSW will assess when able.  Argentina PonderKaren Martha Yeates, MSW, Theresia MajorsLCSWA [phone]

## 2018-04-16 NOTE — Progress Notes (Signed)
Right upper arm PICC removed by Greer EeSusan Presto, RN. Pressure held for 15 minutes. No active bleeding at site, site covered with guaze and transparent dressing. Right arm elevated and coban dressing added. Will continue to monitor site closely.

## 2018-04-17 LAB — HEPATITIS C ANTIBODY: HCV Ab: <0.1 s/co ratio (ref 0.0–0.9)

## 2018-04-17 LAB — GLUCOSE, CAPILLARY
Glucose-Capillary: 179 mg/dL — ABNORMAL HIGH (ref 70–99)
Glucose-Capillary: 188 mg/dL — ABNORMAL HIGH (ref 70–99)
Glucose-Capillary: 188 mg/dL — ABNORMAL HIGH (ref 70–99)
Glucose-Capillary: 163 mg/dL — ABNORMAL HIGH (ref 70–99)

## 2018-04-17 LAB — BASIC METABOLIC PANEL
Anion gap: 7 (ref 5–15)
BUN: 30 mg/dL — AB (ref 8–23)
CO2: 31 mmol/L (ref 22–32)
Calcium: 8.9 mg/dL (ref 8.9–10.3)
Chloride: 99 mmol/L (ref 98–111)
Creatinine, Ser: 1.52 mg/dL — ABNORMAL HIGH (ref 0.44–1.00)
GFR calc Af Amer: 36 mL/min — ABNORMAL LOW (ref >60)
GFR calc non Af Amer: 31 mL/min — ABNORMAL LOW (ref >60)
Glucose, Bld: 188 mg/dL — ABNORMAL HIGH (ref 70–99)
POTASSIUM: 4.1 mmol/L (ref 3.5–5.1)
Sodium: 137 mmol/L (ref 135–145)

## 2018-04-17 LAB — PROTIME-INR
INR: 1.71
Prothrombin Time: 19.9 seconds — ABNORMAL HIGH (ref 11.4–15.2)

## 2018-04-17 MED ORDER — WARFARIN SODIUM 2 MG PO TABS
2.0000 mg | ORAL_TABLET | Freq: Once | ORAL | Status: AC
  Administered 2018-04-17: 2 mg via ORAL
  Filled 2018-04-17: qty 1

## 2018-04-17 MED ORDER — WARFARIN SODIUM 2 MG PO TABS: 2.0000 mg | ORAL_TABLET | ORAL | Status: DC

## 2018-04-17 MED ORDER — WARFARIN SODIUM 1 MG PO TABS: 1.0000 mg | ORAL_TABLET | ORAL | Status: DC

## 2018-04-17 NOTE — Plan of Care (Signed)
  Problem: Health Behavior/Discharge Planning: Goal: Ability to manage health-related needs will improve Outcome: Progressing Note:  Patient to d/c to SNF in the AM so long as no complications arise between now and then. Patient is aware. Will continue to monitor progress. Jari FavreSteven M [facility]hoff

## 2018-04-17 NOTE — Consult Note (Addendum)
ANTICOAGULATION CONSULT NOTE - Initial Consult  Pharmacy Consult for Warfarin and enoxaparin Dosing  Indication: atrial fibrillation  Allergies  Allergen Reactions  . Ciprofloxacin Itching and Rash  . Fosamax [Alendronate Sodium] Other (See Comments)    Reaction: Ulcers in mouth and esophagus  . Iron Other (See Comments)   Patient Measurements: Height: 5\' 3"  (160 cm) Weight: 209 lb 14.4 oz (95.2 kg) IBW/kg (Calculated) : 52.4 Vital Signs: Temp: 98.4 F (36.9 C) (08/25 0512) Temp Source: Oral (08/25 0512) BP: 163/79 (08/25 0512) Pulse Rate: 60 (08/25 0512)  Labs: Recent Labs    04/15/18 0351 04/16/18 0521 04/17/18 0601  HGB  --  11.8*  --   HCT  --  33.8*  --   PLT  --  72*  --   LABPROT 16.0* 18.3* 19.9*  INR 1.29 1.53 1.71  CREATININE 1.68*  --  1.52*    Estimated Creatinine Clearance: 32.9 mL/min (A) (by C-G formula based on SCr of 1.52 mg/dL (H)).  Medical History: Past Medical History:  Diagnosis Date  . Atrial fibrillation (HCC)   . Atrial flutter (HCC)   . CHF (congestive heart failure) (HCC)   . CKD (chronic kidney disease) stage 3, GFR 30-59 ml/min (HCC)   . Coronary disease   . Dehydration   . Diabetes (HCC)   . Fracture of humeral shaft, right, closed   . Gout   . Hypertension   . Hypothyroidism   . Peripheral neuropathy   . Renal insufficiency   . Right renal artery stenosis (HCC)   . Syncope and collapse   . Vitiligo     Assessment: Pharmacy consulted for warfarin dosing and lab monitoring in 77 yo female admitted with sepsis. Patient had PMH of A. Fib and had a therapeutic INR on admission @ 2.16.   Home Regimen: Warfarin 2mg  M,Tu,W,Th,F                              Warfarin 1mg  Sa,Su  DATE INR DOSE 8/19 2.16     - 8/20 1.98 2mg  8/21 1.75 3mg  8/22 1.46 4 mg 0823 1.29 5 mg 0824    1.53 3mg  0825    1.71   Goal of Therapy:  INR 2-3 Monitor platelets by anticoagulation protocol: Yes   Plan:  1. INR still trending back up. I  will give 2mg  tonight.  INR in the AM  Lovenox d/c yesterday as INR is trending up and per MD pt does not have hx of stroke or clot therefore does not feel as though bridging is needed.  Patient has chronic thrombocytopenia. Plts:72 yesterday.   Pharmacy will continue to follow daily and adjust dose as needed.   Olene FlossMelissa D Maccia, Pharm.D, BCPS Clinical Pharmacist 04/17/2018 7:03 AM

## 2018-04-17 NOTE — Progress Notes (Signed)
[facility] [facility] [place] [facility]   PATIENT NAME: Haley Hill    MR#:  [id number]  DATE OF BIRTH:  [birth year]  SUBJECTIVE:  CHIEF COMPLAINT:   Chief Complaint  Patient presents with  . Fall   -No complaints, doing well today.  Awaiting for a rehab bed  REVIEW OF SYSTEMS:  Review of Systems  Constitutional: Positive for malaise/fatigue. Negative for chills and fever.  HENT: Positive for hearing loss. Negative for congestion, ear discharge and nosebleeds.   Eyes: Negative for blurred vision and double vision.  Respiratory: Negative for cough, shortness of breath and wheezing.   Cardiovascular: Negative for chest pain, palpitations and leg swelling.  Gastrointestinal: Negative for abdominal pain, constipation, diarrhea, nausea and vomiting.  Genitourinary: Negative for dysuria.  Musculoskeletal: Negative for myalgias.  Neurological: Positive for weakness. Negative for dizziness, seizures and headaches.  Psychiatric/Behavioral: Negative for depression.    DRUG ALLERGIES:   Allergies  Allergen Reactions  . Ciprofloxacin Itching and Rash  . Fosamax [Alendronate Sodium] Other (See Comments)    Reaction: Ulcers in mouth and esophagus  . Iron Other (See Comments)    VITALS:  Blood pressure (!) 163/79, pulse 60, temperature 98.4 F (36.9 C), temperature source Oral, resp. rate 18, height 5\' 3"  (1.6 m), weight 95.2 kg, SpO2 95 %.  PHYSICAL EXAMINATION:  Physical Exam  GENERAL:  77 y.o.-year-old patient lying in the bed with no acute distress.  EYES: Pupils equal, round, reactive to light and accommodation. No scleral icterus. Extraocular muscles intact.  HEENT: Head atraumatic, normocephalic. Oropharynx and nasopharynx clear.  NECK:  Supple, no jugular venous distention. No thyroid enlargement, no tenderness.  LUNGS: Normal breath sounds bilaterally, no wheezing, rales,rhonchi or crepitation. No use of accessory muscles of respiration.  Decreased bibasilar  breath sounds CARDIOVASCULAR: S1, S2 normal. No  rubs, or gallops.  2/6 systolic murmur is noted ABDOMEN: Soft, nontender, nondistended. Bowel sounds present. No organomegaly or mass.  EXTREMITIES: No pedal edema, cyanosis, or clubbing.  NEUROLOGIC: Cranial nerves II through XII are intact. Muscle strength 5/5 in all extremities. Sensation intact. Gait not checked.  Global weakness noted PSYCHIATRIC: The patient is alert and oriented x 3.  SKIN: No obvious rash, lesion, or ulcer.    LABORATORY PANEL:   CBC Recent Labs  Lab 04/16/18 0521  WBC 5.1  HGB 11.8*  HCT 33.8*  PLT 72*   ------------------------------------------------------------------------------------------------------------------  Chemistries  Recent Labs  Lab 04/14/18 0411  04/17/18 0601  NA 139   < > 137  K 3.6   < > 4.1  CL 103   < > 99  CO2 31   < > 31  GLUCOSE 79   < > 188*  BUN 34*   < > 30*  CREATININE 1.74*   < > 1.52*  CALCIUM 8.8*   < > 8.9  AST 29  --   --   ALT 17  --   --   ALKPHOS 59  --   --   BILITOT 1.3*  --   --    < > = values in this interval not displayed.   ------------------------------------------------------------------------------------------------------------------  Cardiac Enzymes No results for input(s): TROPONINI in the last 168 hours. ------------------------------------------------------------------------------------------------------------------  RADIOLOGY:  US Abdomen Limited Ruq  Result Date: 04/16/2018 CLINICAL DATA:  Abdominal pain over the last week. EXAM: ULTRASOUND ABDOMEN LIMITED RIGHT UPPER QUADRANT COMPARISON:  04/12/2018.  10/08/2016. FINDINGS: Gallbladder: Mobile stones and sludge in the gallbladder. No Murphy sign. Wall thickness  is normal. No surrounding fluid. Largest stone is 7 mm. Common bile duct: Diameter: 4 mm common normal Liver: Echogenic liver with nodular surface consistent with cirrhosis. No focal lesion or ductal dilatation. Portal vein is patent  on color Doppler imaging with normal direction of blood flow towards the liver. IMPRESSION: Mobile stones and sludge in the gallbladder. Largest stone 7 mm. No sonographic evidence cholecystitis, obstruction or ductal dilatation. Chronic findings of cirrhosis. Increased echogenicity. Nodular surface. No focal lesion. Electronically Signed   By: Paulina FusiMark  Shogry M.D.   On: 04/16/2018 09:24    EKG:   Orders placed or performed during the hospital encounter of 04/11/18  . EKG 12-Lead  . EKG 12-Lead  . EKG 12-Lead  . EKG 12-Lead    ASSESSMENT AND PLAN:   77 year old female with past medical history significant for A. fib on Coumadin, CKD age 89, CHF, hypertension was brought in from home secondary to sepsis.  1.  Sepsis-E. coli sepsis with E. coli growing in blood and urine cultures. -Initially required vasopressors, off pressors now.  Blood pressure is stable. -Received IV antibiotics-changed to  oral antibiotics at this time-on amoxicillin -Treat for 14 days total.  2.  Acute renal failure on CKD stage III-Baseline creatinine seems to be around 1.7. -Secondary to sepsis and ATN. -Stable creatinine at this time  3.  Hypothyroidism-continue Synthroid  4.  Hypertension-on Imdur.  5.  Diabetes mellitus-continue sliding scale insulin and NovoLog 70/30 twice daily  6.  Paroxysmal atrial fibrillation-metoprolol on hold as bradycardic.  Continue Coumadin for anticoagulation.  Pharmacy adjusting the dose -Discontinued Lovenox due to hematoma on the arm at PICC line site.  Also with no history of stroke, no bridging needed  PT recommended SNF- social worker consulted Possible discharge tomorrow   All the records are reviewed and case discussed with Care Management/Social Workerr. Management plans discussed with the patient, family and they are in agreement.  CODE STATUS: Full code  TOTAL TIME TAKING CARE OF THIS PATIENT: 33 minutes.   POSSIBLE D/C TOMORROW, DEPENDING ON CLINICAL  CONDITION.   Azhar Knope M.D on 04/17/2018 at 9:52 AM  Between 7am to 6pm - Pager - [phone]  After 6pm go to [email or web address] - Social research officer, governmentpassword EPAS [facility]  [facility]  Office  [phone]  CC: Primary care physician; Barbette ReichmannHande, Vishwanath, MD

## 2018-04-18 ENCOUNTER — Encounter
Admission: RE | Admit: 2018-04-18 | Discharge: 2018-04-18 | Disposition: A | Discharge status: Home / Self Care | Payer: Medicare Other | Source: Ambulatory Visit | Attending: Internal Medicine | Admitting: Internal Medicine

## 2018-04-18 LAB — PROTIME-INR
INR: 1.92
PROTHROMBIN TIME: 21.8 s — AB (ref 11.4–15.2)

## 2018-04-18 LAB — GLUCOSE, CAPILLARY: Glucose-Capillary: 236 mg/dL — ABNORMAL HIGH (ref 70–99)

## 2018-04-18 MED ORDER — AMOXICILLIN 500 MG PO CAPS: 1000.0000 mg | ORAL_CAPSULE | Freq: Three times a day (TID) | ORAL | 0 refills | Status: AC

## 2018-04-18 MED ORDER — FUROSEMIDE 10 MG/ML IJ SOLN
40.0000 mg | Freq: Once | INTRAMUSCULAR | Status: AC
  Administered 2018-04-18: 40 mg via INTRAVENOUS
  Filled 2018-04-18: qty 4

## 2018-04-18 MED ORDER — GLIPIZIDE 10 MG PO TABS: 10.0000 mg | ORAL_TABLET | Freq: Every day | ORAL | 0 refills | Status: DC

## 2018-04-18 MED ORDER — HYDROCODONE-ACETAMINOPHEN 5-325 MG PO TABS: 1.0000 | ORAL_TABLET | Freq: Four times a day (QID) | ORAL | 0 refills | Status: DC | PRN

## 2018-04-18 MED ORDER — ALLOPURINOL 100 MG PO TABS: 100.0000 mg | ORAL_TABLET | Freq: Every day | ORAL | 2 refills | Status: DC

## 2018-04-18 NOTE — Progress Notes (Signed)
Called nurse-to-nurse report to the receiving facility at this time. All questions answered. Haley BlendSteven Hill Haley Hill  Called EMS for non-emergent patient transport to the receiving facility at this time. Patient will transport in gown only. Jari FavreSteven Hill [facility]

## 2018-04-18 NOTE — Clinical Social Work Note (Signed)
[facility] to [facility] [employer] [facility]. CSW has informed patient and physician. York SpanielMonica Kassy Mcenroe MSW,LcSW [phone]

## 2018-04-18 NOTE — Discharge Instructions (Signed)

## 2018-04-18 NOTE — Discharge Summary (Signed)
[employer] [facility] [place] [facility]   PATIENT NAME: Haley Hill    MR#:  [id number]  DATE OF BIRTH:  09-29-1940  DATE OF ADMISSION:  04/11/2018   ADMITTING PHYSICIAN: Barbaraann Rondo, MD  DATE OF DISCHARGE:  04/18/18  PRIMARY CARE PHYSICIAN: Barbette Reichmann, MD   ADMISSION DIAGNOSIS:   Lactic acidosis [E87.2] Atrial fibrillation with rapid ventricular response (HCC) [I48.91] Fall, initial encounter [W19.XXXA] Sepsis, due to unspecified organism (HCC) [A41.9] Urinary tract infection with hematuria, site unspecified [N39.0, R31.9] Acute hypoxemic respiratory failure (HCC) [J96.01]  DISCHARGE DIAGNOSIS:   Active Problems:   Sepsis secondary to UTI (HCC)   Acute hypoxemic respiratory failure (HCC)   SECONDARY DIAGNOSIS:   Past Medical History:  Diagnosis Date  . Atrial fibrillation (HCC)   . Atrial flutter (HCC)   . CHF (congestive heart failure) (HCC)   . CKD (chronic kidney disease) stage 3, GFR 30-59 ml/min (HCC)   . Coronary disease   . Dehydration   . Diabetes (HCC)   . Fracture of humeral shaft, right, closed   . Gout   . Hypertension   . Hypothyroidism   . Peripheral neuropathy   . Renal insufficiency   . Right renal artery stenosis (HCC)   . Syncope and collapse   . Vitiligo     HOSPITAL COURSE:   77 year old female with past medical history significant for A. fib on Coumadin, CKD age 65, CHF, hypertension was brought in from home secondary to sepsis.  1.  Sepsis-E. coli sepsis with E. coli growing in blood and urine cultures. -Initially required vasopressors, off pressors now.  Blood pressure is stable. -Received IV antibiotics-changed to  oral amoxicillin at this time- for 14 days total  2.  Acute renal failure on CKD stage III-Baseline creatinine seems to be around 1.7. -Secondary to sepsis and ATN. -Stable creatinine at this time  3.  Hypothyroidism-continue Synthroid  4.  Hypertension-on Imdur   5.  Diabetes  mellitus-continue Humalog 70/30 twice daily  6.  Paroxysmal atrial fibrillation-metoprolol on hold as bradycardic.  Continue Coumadin for anticoagulation.    Patient will be discharged to [facility] rehab whenever bed is available  DISCHARGE CONDITIONS:   Guarded  CONSULTS OBTAINED:   Treatment Team:  Barbaraann Rondo, MD  DRUG ALLERGIES:   Allergies  Allergen Reactions  . Ciprofloxacin Itching and Rash  . Fosamax [Alendronate Sodium] Other (See Comments)    Reaction: Ulcers in mouth and esophagus  . Iron Other (See Comments)   DISCHARGE MEDICATIONS:   Allergies as of 04/18/2018      Reactions   Ciprofloxacin Itching, Rash   Fosamax [alendronate Sodium] Other (See Comments)   Reaction: Ulcers in mouth and esophagus   Iron Other (See Comments)      Medication List    STOP taking these medications   metoprolol succinate 25 MG 24 hr tablet Commonly known as:  TOPROL-XL     TAKE these medications   acetaminophen 500 MG tablet Commonly known as:  TYLENOL Take 500 mg by mouth 2 (two) times daily as needed. for pain/ increased temp. May be administered orally, per G-tube if needed or rectally if unable to swallow (separate order). Maximum dose for 24 hours is 3,000 mg from all sources of Acetaminophen/ Tylenol   allopurinol 100 MG tablet Commonly known as:  ZYLOPRIM Take 1 tablet (100 mg total) by mouth daily. What changed:    medication strength  how much to take   amoxicillin 500 MG capsule  Commonly known as:  AMOXIL Take 2 capsules (1,000 mg total) by mouth every 8 (eight) hours for 8 days.   calcitRIOL 0.5 MCG capsule Commonly known as:  ROCALTROL Take 1 capsule (0.5 mcg total) by mouth daily. What changed:  Another medication with the same name was removed. Continue taking this medication, and follow the directions you see here.   Cyanocobalamin 5000 MCG Lozg Take 5,000 mcg by mouth daily.   diclofenac sodium 1 % Gel Commonly known as:   VOLTAREN Apply 2 g topically 4 (four) times daily. Apply to left foot   ferrous sulfate 325 (65 FE) MG tablet Take 325 mg by mouth daily with breakfast.   furosemide 20 MG tablet Commonly known as:  LASIX Take 1 tablet (20 mg total) by mouth 2 (two) times daily.   gabapentin 800 MG tablet Commonly known as:  NEURONTIN Take 1 tablet (800 mg total) by mouth every 8 (eight) hours.   glipiZIDE 10 MG tablet Commonly known as:  GLUCOTROL Take 1 tablet (10 mg total) by mouth daily before breakfast. What changed:  when to take this   HUMULIN 70/30 KWIKPEN (70-30) 100 UNIT/ML PEN Generic drug:  Insulin Isophane & Regular Human Inject 15 Units into the skin 2 (two) times daily.   HYDROcodone-acetaminophen 5-325 MG tablet Commonly known as:  NORCO/VICODIN Take 1 tablet by mouth every 6 (six) hours as needed for moderate pain or severe pain. What changed:    when to take this  reasons to take this   isosorbide mononitrate 20 MG tablet Commonly known as:  ISMO,MONOKET Take 1 tablet (20 mg total) by mouth daily.   levothyroxine 112 MCG tablet Commonly known as:  SYNTHROID, LEVOTHROID Take 1 tablet (112 mcg total) by mouth daily before breakfast.   omeprazole 20 MG capsule Commonly known as:  PRILOSEC Take 1 capsule (20 mg total) by mouth 2 (two) times daily.   ONE TOUCH ULTRA TEST test strip Generic drug:  glucose blood 1 each by Other route 4 (four) times daily.   potassium chloride SA 20 MEQ tablet Commonly known as:  K-DUR,KLOR-CON Take 1 tablet (20 mEq total) by mouth daily.   rOPINIRole 0.5 MG tablet Commonly known as:  REQUIP Take 0.5 mg by mouth at bedtime.   senna-docusate 8.6-50 MG tablet Commonly known as:  Senokot-S Take 1 tablet by mouth at bedtime as needed for mild constipation.   simvastatin 40 MG tablet Commonly known as:  ZOCOR Take 1 tablet (40 mg total) by mouth at bedtime.   Vitamin D 2000 units Caps Take 2,000 Units by mouth daily.   warfarin  1 MG tablet Commonly known as:  COUMADIN Take 1 tablet (1 mg total) by mouth daily. What changed:    how much to take  additional instructions        DISCHARGE INSTRUCTIONS:   1. PCP f/u in 1-2 weeks 2. Cardiology f/u in 1 week  DIET:   Cardiac diet  ACTIVITY:   Activity as tolerated  OXYGEN:   Home Oxygen: No.  Oxygen Delivery: room air  DISCHARGE LOCATION:   nursing home   If you experience worsening of your admission symptoms, develop shortness of breath, life threatening emergency, suicidal or homicidal thoughts you must seek medical attention immediately by calling 911 or calling your MD immediately  if symptoms less severe.  You Must read complete instructions/literature along with all the possible adverse reactions/side effects for all the Medicines you take and that have been prescribed to  you. Take any new Medicines after you have completely understood and accpet all the possible adverse reactions/side effects.   Please note  You were cared for by a hospitalist during your hospital stay. If you have any questions about your discharge medications or the care you received while you were in the hospital after you are discharged, you can call the unit and asked to speak with the hospitalist on call if the hospitalist that took care of you is not available. Once you are discharged, your primary care physician will handle any further medical issues. Please note that NO REFILLS for any discharge medications will be authorized once you are discharged, as it is imperative that you return to your primary care physician (or establish a relationship with a primary care physician if you do not have one) for your aftercare needs so that they can reassess your need for medications and monitor your lab values.    On the day of Discharge:  VITAL SIGNS:   Blood pressure (!) 155/76, pulse (!) 53, temperature 98 F (36.7 C), temperature source Oral, resp. rate 19, height 5\' 3"   (1.6 m), weight 95.6 kg, SpO2 95 %.  PHYSICAL EXAMINATION:   GENERAL:  77 y.o.-year-old patient lying in the bed with no acute distress.  EYES: Pupils equal, round, reactive to light and accommodation. No scleral icterus. Extraocular muscles intact.  HEENT: Head atraumatic, normocephalic. Oropharynx and nasopharynx clear.  NECK:  Supple, no jugular venous distention. No thyroid enlargement, no tenderness.  LUNGS: Normal breath sounds bilaterally, no wheezing, rales,rhonchi or crepitation. No use of accessory muscles of respiration.  Decreased bibasilar breath sounds CARDIOVASCULAR: S1, S2 normal. No  rubs, or gallops.  2/6 systolic murmur is noted ABDOMEN: Soft, nontender, nondistended. Bowel sounds present. No organomegaly or mass.  EXTREMITIES: No pedal edema, cyanosis, or clubbing.  NEUROLOGIC: Cranial nerves II through XII are intact. Muscle strength 5/5 in all extremities. Sensation intact. Gait not checked.  Global weakness noted PSYCHIATRIC: The patient is alert and oriented x 3.  SKIN: No obvious rash, lesion, or ulcer.   DATA REVIEW:   CBC Recent Labs  Lab 04/16/18 0521  WBC 5.1  HGB 11.8*  HCT 33.8*  PLT 72*    Chemistries  Recent Labs  Lab 04/14/18 0411  04/17/18 0601  NA 139   < > 137  K 3.6   < > 4.1  CL 103   < > 99  CO2 31   < > 31  GLUCOSE 79   < > 188*  BUN 34*   < > 30*  CREATININE 1.74*   < > 1.52*  CALCIUM 8.8*   < > 8.9  AST 29  --   --   ALT 17  --   --   ALKPHOS 59  --   --   BILITOT 1.3*  --   --    < > = values in this interval not displayed.     Microbiology Results  Results for orders placed or performed during the hospital encounter of 04/11/18  Blood culture (routine x 2)     Status: Abnormal   Collection Time: 04/11/18 10:11 PM  Result Value Ref Range Status   Specimen Description   Final    BLOOD LEFT ANTECUBITAL Performed at [facility], [address] [place], [place] [address]    Special Requests   Final     BOTTLES DRAWN AEROBIC AND ANAEROBIC Blood Culture results may not be optimal due to  an excessive volume of blood received in culture bottles Performed at [facility], [address] [place], [place] [address]    Culture  Setup Time   Final    IN BOTH AEROBIC AND ANAEROBIC BOTTLES GRAM NEGATIVE RODS CRITICAL RESULT CALLED TO, READ BACK BY AND VERIFIED WITH: RODNEY GRUBB AT 1034 ON 04/12/2018 JJB Performed at [facility], [address] [place], [place] [address]    Culture (A)  Final    ESCHERICHIA COLI SUSCEPTIBILITIES PERFORMED ON PREVIOUS CULTURE WITHIN THE LAST 5 DAYS. Performed at [facility], [address], [place], [place] [address]    Report Status 04/14/2018 FINAL  Final  Blood culture (routine x 2)     Status: Abnormal   Collection Time: 04/11/18 10:31 PM  Result Value Ref Range Status   Specimen Description   Final    BLOOD RIGHT HAND Performed at [facility], [address], [place], [place] [address]    Special Requests   Final    BOTTLES DRAWN AEROBIC AND ANAEROBIC Blood Culture adequate volume Performed at [facility], [address] [place], [place] [address]    Culture  Setup Time   Final    GRAM NEGATIVE RODS IN BOTH AEROBIC AND ANAEROBIC BOTTLES CRITICAL RESULT CALLED TO, READ BACK BY AND VERIFIED WITH: RODNEY GRUBB AT 1034 ON 04/12/2018 JJB Performed at [facility], [address] [place], [place] [address]    Culture ESCHERICHIA COLI (A)  Final   Report Status 04/14/2018 FINAL  Final   Organism ID, Bacteria ESCHERICHIA COLI  Final      Susceptibility   Escherichia coli - MIC*    AMPICILLIN 4 SENSITIVE Sensitive     CEFAZOLIN <=4 SENSITIVE Sensitive     CEFEPIME <=1 SENSITIVE Sensitive     CEFTAZIDIME <=1 SENSITIVE Sensitive     CEFTRIAXONE <=1 SENSITIVE Sensitive     CIPROFLOXACIN <=0.25 SENSITIVE Sensitive     GENTAMICIN <=1 SENSITIVE Sensitive     IMIPENEM <=0.25 SENSITIVE Sensitive      TRIMETH/SULFA <=20 SENSITIVE Sensitive     AMPICILLIN/SULBACTAM <=2 SENSITIVE Sensitive     PIP/TAZO <=4 SENSITIVE Sensitive     Extended ESBL NEGATIVE Sensitive     * ESCHERICHIA COLI  Blood Culture ID Panel (Reflexed)     Status: Abnormal   Collection Time: 04/11/18 10:31 PM  Result Value Ref Range Status   Enterococcus species NOT DETECTED NOT DETECTED Final   Listeria monocytogenes NOT DETECTED NOT DETECTED Final   Staphylococcus species NOT DETECTED NOT DETECTED Final   Staphylococcus aureus NOT DETECTED NOT DETECTED Final   Streptococcus species NOT DETECTED NOT DETECTED Final   Streptococcus agalactiae NOT DETECTED NOT DETECTED Final   Streptococcus pneumoniae NOT DETECTED NOT DETECTED Final   Streptococcus pyogenes NOT DETECTED NOT DETECTED Final   Acinetobacter baumannii NOT DETECTED NOT DETECTED Final   Enterobacteriaceae species DETECTED (A) NOT DETECTED Final    Comment: Enterobacteriaceae represent a large family of gram-negative bacteria, not a single organism. CRITICAL RESULT CALLED TO, READ BACK BY AND VERIFIED WITH: RODNEY GRUBB AT 1034 ON 04/12/2018 JJB    Enterobacter cloacae complex NOT DETECTED NOT DETECTED Final   Escherichia coli DETECTED (A) NOT DETECTED Final    Comment: CRITICAL RESULT CALLED TO, READ BACK BY AND VERIFIED WITH: RODNEY GRUBB AT 1034 ON 04/12/2018 JJB    Klebsiella oxytoca NOT DETECTED NOT DETECTED Final   Klebsiella pneumoniae NOT DETECTED NOT DETECTED Final  Proteus species NOT DETECTED NOT DETECTED Final   Serratia marcescens NOT DETECTED NOT DETECTED Final   Carbapenem resistance NOT DETECTED NOT DETECTED Final   Haemophilus influenzae NOT DETECTED NOT DETECTED Final   Neisseria meningitidis NOT DETECTED NOT DETECTED Final   Pseudomonas aeruginosa NOT DETECTED NOT DETECTED Final   Candida albicans NOT DETECTED NOT DETECTED Final   Candida glabrata NOT DETECTED NOT DETECTED Final   Candida krusei NOT DETECTED NOT DETECTED Final    Candida parapsilosis NOT DETECTED NOT DETECTED Final   Candida tropicalis NOT DETECTED NOT DETECTED Final    Comment: Performed at [facility], [address], [place], [place] [address]  Urine Culture     Status: Abnormal   Collection Time: 04/11/18 11:15 PM  Result Value Ref Range Status   Specimen Description   Final    URINE, RANDOM Performed at [facility], [address] [place], [place] [address]    Special Requests   Final    NONE Performed at [facility], [address] [place], [place] [address]    Culture >=100,000 COLONIES/mL ESCHERICHIA COLI (A)  Final   Report Status 04/14/2018 FINAL  Final   Organism ID, Bacteria ESCHERICHIA COLI (A)  Final      Susceptibility   Escherichia coli - MIC*    AMPICILLIN 4 SENSITIVE Sensitive     CEFAZOLIN <=4 SENSITIVE Sensitive     CEFTRIAXONE <=1 SENSITIVE Sensitive     CIPROFLOXACIN <=0.25 SENSITIVE Sensitive     GENTAMICIN <=1 SENSITIVE Sensitive     IMIPENEM <=0.25 SENSITIVE Sensitive     NITROFURANTOIN <=16 SENSITIVE Sensitive     TRIMETH/SULFA <=20 SENSITIVE Sensitive     AMPICILLIN/SULBACTAM <=2 SENSITIVE Sensitive     PIP/TAZO <=4 SENSITIVE Sensitive     Extended ESBL NEGATIVE Sensitive     * >=100,000 COLONIES/mL ESCHERICHIA COLI  MRSA PCR Screening     Status: None   Collection Time: 04/12/18  5:37 AM  Result Value Ref Range Status   MRSA by PCR NEGATIVE NEGATIVE Final    Comment:        The GeneXpert MRSA Assay (FDA approved for NASAL specimens only), is one component of a comprehensive MRSA colonization surveillance program. It is not intended to diagnose MRSA infection nor to guide or monitor treatment for MRSA infections. Performed at [facility], [address], [place], [place] [address]     RADIOLOGY:  No results found.   Management plans discussed with the patient, family and they are in agreement.  CODE STATUS:     Code Status Orders  (From  admission, onward)         Start     Ordered   04/12/18 0443  Full code  Continuous     04/12/18 0442        Code Status History    Date Active Date Inactive Code Status Order ID Comments User Context   02/20/2018 2122 02/23/2018 1724 Full Code [id number]  Ihor Austin, MD Inpatient   04/29/2015 1726 05/02/2015 2124 Full Code [id number]  Adrian Saran, MD Inpatient   01/25/2015 1633 01/28/2015 1855 Full Code [id number]  Milagros Loll, MD ED      TOTAL TIME TAKING CARE OF THIS PATIENT: 38  minutes.    Jaydah Stahle M.D on 04/18/2018 at 12:55 PM  Between 7am to 6pm - Pager - [phone]  After 6pm go to [email or web address] - Social research officer, government  [employer]  [phone]  CC: Primary care physician; Barbette Reichmann, MD   Note: This dictation was prepared with Dragon dictation along with smaller phrase technology. Any transcriptional errors that result from this process are unintentional.

## 2018-04-18 NOTE — Care Management Important Message (Signed)
Important Message  Patient Details  Name: Haley Hill MRN: [id number] Date of Birth: September 25, 1940   Medicare Important Message Given:  Yes    Olegario MessierKathy A Zsazsa Bahena 04/18/2018, 11:19 AM

## 2018-04-18 NOTE — Care Management (Signed)
RNCM updated [name] with [facility] of patient discharge to [facility].

## 2018-04-18 NOTE — Clinical Social Work Note (Signed)
CSW has spoken to patient and daughter in law: [name]: [phone] and [facility] has obtained authorization. Discharge information sent. Patient's daughter in law, [name], is insistent on patient transporting by EMS. CSW did make [name] aware that insurance may not approve with patient being able to ambulate small distance and able to sit up. [name] still insistent on discharge via EMS as she is too worried of falls and fluid in lungs. York SpanielMonica Lennell Shanks MSW,LCSW [phone]

## 2018-04-18 NOTE — Clinical Social Work Placement (Signed)
   CLINICAL SOCIAL WORK PLACEMENT  NOTE  Date:  04/18/2018  Patient Details  Name: Haley Hill MRN: [id number] Date of Birth: Oct 12, 1940  Clinical Social Work is seeking post-discharge placement for this patient at the Skilled  Nursing Facility level of care (*CSW will initial, date and re-position this form in  chart as items are completed):  Yes   Patient/family provided with Hand Clinical Social Work Department's list of facilities offering this level of care within the geographic area requested by the patient (or if unable, by the patient's family).  Yes   Patient/family informed of their freedom to choose among providers that offer the needed level of care, that participate in Medicare, Medicaid or managed care program needed by the patient, have an available bed and are willing to accept the patient.  Yes   Patient/family informed of [facility]'s ownership interest in [facility] and [facility], as well as of the fact that they are under no obligation to receive care at these facilities.  PASRR submitted to EDS on       PASRR number received on       Existing PASRR number confirmed on 04/16/18     FL2 transmitted to all facilities in geographic area requested by pt/family on 04/16/18     FL2 transmitted to all facilities within larger geographic area on       Patient informed that his/her managed care company has contracts with or will negotiate with certain facilities, including the following:        Yes   Patient/family informed of bed offers received.  Patient chooses bed at [facility]     Physician recommends and patient chooses bed at [facility](SNF)    Patient to be transferred to [facility]) on 04/18/18.  Patient to be transferred to facility by (EMS)     Patient family notified on 04/18/18 of transfer.  Name of family member notified:  Tresa Endo(Kelly)     PHYSICIAN Please sign FL2     Additional Comment:     _______________________________________________ York SpanielMonica Kadra Kohan, LCSW 04/18/2018, 2:03 PM

## 2018-04-18 NOTE — Plan of Care (Signed)
  Problem: Health Behavior/Discharge Planning: Goal: Ability to manage health-related needs will improve Outcome: Progressing Note:  [facility], if insurance authorization is received. Discharge order already written. Awaiting approval. Will continue to monitor. Haley FavreSteven M [facility]

## 2018-04-20 ENCOUNTER — Other Ambulatory Visit
Admission: RE | Admit: 2018-04-20 | Discharge: 2018-04-20 | Disposition: A | Discharge status: Home / Self Care | Payer: Medicare Other | Source: Ambulatory Visit | Attending: Internal Medicine | Admitting: Internal Medicine

## 2018-04-20 DIAGNOSIS — I480 Paroxysmal atrial fibrillation: Secondary | ICD-10-CM | POA: Insufficient documentation

## 2018-04-20 LAB — PROTIME-INR
INR: 1.99
Prothrombin Time: 22.4 seconds — ABNORMAL HIGH (ref 11.4–15.2)

## 2018-04-21 ENCOUNTER — Other Ambulatory Visit
Admission: RE | Admit: 2018-04-21 | Discharge: 2018-04-21 | Disposition: A | Discharge status: Home / Self Care | Payer: Medicare Other | Source: Ambulatory Visit | Attending: Internal Medicine | Admitting: Internal Medicine

## 2018-04-21 DIAGNOSIS — I4891 Unspecified atrial fibrillation: Secondary | ICD-10-CM | POA: Diagnosis present

## 2018-04-21 DIAGNOSIS — N390 Urinary tract infection, site not specified: Secondary | ICD-10-CM | POA: Diagnosis present

## 2018-04-21 LAB — CBC WITH DIFFERENTIAL/PLATELET
BASOS ABS: 0.1 10*3/uL (ref 0–0.1)
Basophils Relative: 1 %
Eosinophils Absolute: 0.2 10*3/uL (ref 0–0.7)
Eosinophils Relative: 3 %
HEMATOCRIT: 34.1 % — AB (ref 35.0–47.0)
Hemoglobin: 11.8 g/dL — ABNORMAL LOW (ref 12.0–16.0)
LYMPHS ABS: 1.6 10*3/uL (ref 1.0–3.6)
LYMPHS PCT: 27 %
MCH: 36.3 pg — ABNORMAL HIGH (ref 26.0–34.0)
MCHC: 34.5 g/dL (ref 32.0–36.0)
MCV: 105 fL — ABNORMAL HIGH (ref 80.0–100.0)
MONOS PCT: 5 %
Monocytes Absolute: 0.3 10*3/uL (ref 0.2–0.9)
Neutro Abs: 3.6 10*3/uL (ref 1.4–6.5)
Neutrophils Relative %: 64 %
Platelets: 104 10*3/uL — ABNORMAL LOW (ref 150–440)
RBC: 3.24 MIL/uL — ABNORMAL LOW (ref 3.80–5.20)
RDW: 14.7 % — AB (ref 11.5–14.5)
WBC: 5.7 10*3/uL (ref 3.6–11.0)

## 2018-04-21 LAB — PROTIME-INR
INR: 1.82
Prothrombin Time: 20.9 seconds — ABNORMAL HIGH (ref 11.4–15.2)

## 2018-04-26 ENCOUNTER — Other Ambulatory Visit
Admission: RE | Admit: 2018-04-26 | Discharge: 2018-04-26 | Disposition: A | Discharge status: Home / Self Care | Payer: Medicare Other | Source: Ambulatory Visit | Attending: Adult Health | Admitting: Adult Health

## 2018-04-26 ENCOUNTER — Encounter
Admission: RE | Admit: 2018-04-26 | Discharge: 2018-04-26 | Disposition: A | Discharge status: Home / Self Care | Payer: Medicare Other | Source: Ambulatory Visit | Attending: Internal Medicine | Admitting: Internal Medicine

## 2018-04-26 DIAGNOSIS — I481 Persistent atrial fibrillation: Secondary | ICD-10-CM | POA: Diagnosis present

## 2018-04-26 LAB — PROTIME-INR
INR: 1.43
PROTHROMBIN TIME: 17.3 s — AB (ref 11.4–15.2)

## 2018-04-27 ENCOUNTER — Encounter: Payer: Self-pay | Admitting: Adult Health

## 2018-04-27 ENCOUNTER — Non-Acute Institutional Stay (SKILLED_NURSING_FACILITY): Payer: Medicare Other | Admitting: Adult Health

## 2018-04-27 DIAGNOSIS — E1142 Type 2 diabetes mellitus with diabetic polyneuropathy: Secondary | ICD-10-CM

## 2018-04-27 DIAGNOSIS — I481 Persistent atrial fibrillation: Secondary | ICD-10-CM

## 2018-04-27 DIAGNOSIS — E039 Hypothyroidism, unspecified: Secondary | ICD-10-CM | POA: Diagnosis not present

## 2018-04-27 DIAGNOSIS — Z794 Long term (current) use of insulin: Secondary | ICD-10-CM

## 2018-04-27 DIAGNOSIS — E785 Hyperlipidemia, unspecified: Secondary | ICD-10-CM

## 2018-04-27 DIAGNOSIS — N183 Chronic kidney disease, stage 3 unspecified: Secondary | ICD-10-CM

## 2018-04-27 DIAGNOSIS — I4819 Other persistent atrial fibrillation: Secondary | ICD-10-CM

## 2018-04-27 DIAGNOSIS — D638 Anemia in other chronic diseases classified elsewhere: Secondary | ICD-10-CM

## 2018-04-27 DIAGNOSIS — K219 Gastro-esophageal reflux disease without esophagitis: Secondary | ICD-10-CM | POA: Diagnosis not present

## 2018-04-27 DIAGNOSIS — E119 Type 2 diabetes mellitus without complications: Secondary | ICD-10-CM | POA: Diagnosis not present

## 2018-04-27 DIAGNOSIS — E1169 Type 2 diabetes mellitus with other specified complication: Secondary | ICD-10-CM

## 2018-04-27 DIAGNOSIS — I509 Heart failure, unspecified: Secondary | ICD-10-CM

## 2018-04-27 DIAGNOSIS — M1A9XX0 Chronic gout, unspecified, without tophus (tophi): Secondary | ICD-10-CM

## 2018-04-27 NOTE — Progress Notes (Signed)
Location:   [facility] Room Number: 202A Place of Service:  SNF (31)   CODE STATUS: FULL Allergies  Allergen Reactions  . Ciprofloxacin Itching and Rash  . Fosamax [Alendronate Sodium] Other (See Comments)    Reaction: Ulcers in mouth and esophagus  . Iron Other (See Comments)    Chief Complaint  Patient presents with  . Medical Management of Chronic Issues    Afib; hypothyroidism; gerd. Weekly follow up for the first 30 days post hospitalization     HPI:  She is a 77 year old short term rehab patient being seen for the management of her chronic illnesses: afib; hypothyroidism gerd. She denies any heart burn; no chest pain; no shortness of breath. Her cbgs are elevated.   Past Medical History:  Diagnosis Date  . Atrial fibrillation (HCC)   . Atrial flutter (HCC)   . CHF (congestive heart failure) (HCC)   . CKD (chronic kidney disease) stage 3, GFR 30-59 ml/min (HCC)   . Coronary disease   . Dehydration   . Diabetes (HCC)   . Fracture of humeral shaft, right, closed   . Gout   . Hypertension   . Hypothyroidism   . Peripheral neuropathy   . Renal insufficiency   . Right renal artery stenosis (HCC)   . Syncope and collapse   . Vitiligo     Past Surgical History:  Procedure Laterality Date  . BREAST BIOPSY Right 10/2000   negative for cancer  . BREAST EXCISIONAL BIOPSY Bilateral 1970's   neg  . CARDIAC CATHETERIZATION    . CORONARY ARTERY BYPASS GRAFT    . ESOPHAGOGASTRODUODENOSCOPY (EGD) WITH PROPOFOL N/A 06/10/2017   Procedure: ESOPHAGOGASTRODUODENOSCOPY (EGD) WITH PROPOFOL;  Surgeon: Christena Deem, MD;  Location: [facility] ENDOSCOPY;  Service: Endoscopy;  Laterality: N/A;  . FRACTURE SURGERY      Social History   Socioeconomic History  . Marital status: Divorced    Spouse name: Not on file  . Number of children: Not on file  . Years of education: Not on file  . Highest education level: Not on file  Occupational History  .  Occupation: retired  Engineer, production  . Financial resource strain: Not on file  . Food insecurity:    Worry: Not on file    Inability: Not on file  . Transportation needs:    Medical: Not on file    Non-medical: Not on file  Tobacco Use  . Smoking status: Never Smoker  . Smokeless tobacco: Never Used  Substance and Sexual Activity  . Alcohol use: No  . Drug use: No  . Sexual activity: Not on file  Lifestyle  . Physical activity:    Days per week: Not on file    Minutes per session: Not on file  . Stress: Not on file  Relationships  . Social connections:    Talks on phone: Not on file    Gets together: Not on file    Attends religious service: Not on file    Active member of club or organization: Not on file    Attends meetings of clubs or organizations: Not on file    Relationship status: Not on file  . Intimate partner violence:    Fear of current or ex partner: Not on file    Emotionally abused: Not on file    Physically abused: Not on file    Forced sexual activity: Not on file  Other Topics Concern  . Not on  file  Social History Narrative  . Not on file   Family History  Problem Relation Age of Onset  . Gout Other   . Stroke Other   . Arthritis Other   . Osteoporosis Other       VITAL SIGNS BP 123/67   Pulse (!) 55   Temp 98.2 F (36.8 C) (Oral)   Resp 16   Ht 5\' 3"  (1.6 m)   Wt 209 lb 11.2 oz (95.1 kg)   SpO2 100%   BMI 37.15 kg/m   Outpatient Encounter Medications as of 04/27/2018  Medication Sig  . acetaminophen (TYLENOL) 500 MG tablet Take 500 mg by mouth 2 (two) times daily as needed. for pain/ increased temp. May be administered orally, per G-tube if needed or rectally if unable to swallow (separate order). Maximum dose for 24 hours is 3,000 mg from all sources of Acetaminophen/ Tylenol   . allopurinol (ZYLOPRIM) 100 MG tablet Take 1 tablet (100 mg total) by mouth daily.  . calcitRIOL (ROCALTROL) 0.5 MCG capsule Take 1 capsule (0.5 mcg total) by  mouth daily.  . Cholecalciferol (VITAMIN D) 2000 UNITS CAPS Take 2,000 Units by mouth daily.  . Cyanocobalamin 5000 MCG LOZG Take 5,000 mcg by mouth daily.   . diclofenac sodium (VOLTAREN) 1 % GEL Apply 2 g topically 4 (four) times daily. Apply to left foot  . ferrous sulfate 325 (65 FE) MG tablet Take 325 mg by mouth daily with breakfast.   . furosemide (LASIX) 20 MG tablet Take 1 tablet (20 mg total) by mouth 2 (two) times daily.  Marland Kitchen gabapentin (NEURONTIN) 800 MG tablet Take 1 tablet (800 mg total) by mouth every 8 (eight) hours.  Marland Kitchen glipiZIDE (GLUCOTROL) 10 MG tablet Take 1 tablet (10 mg total) by mouth daily before breakfast.  . HUMULIN 70/30 KWIKPEN (70-30) 100 UNIT/ML PEN Inject 15 Units into the skin 2 (two) times daily.  Marland Kitchen HYDROcodone-acetaminophen (NORCO/VICODIN) 5-325 MG tablet Take 1 tablet by mouth every 6 (six) hours as needed for moderate pain or severe pain.  . isosorbide mononitrate (ISMO,MONOKET) 20 MG tablet Take 1 tablet (20 mg total) by mouth daily.  Marland Kitchen levothyroxine (SYNTHROID, LEVOTHROID) 112 MCG tablet Take 1 tablet (112 mcg total) by mouth daily before breakfast.  . NON FORMULARY Diet Type: NAS, NCS  . omeprazole (PRILOSEC) 20 MG capsule Take 1 capsule (20 mg total) by mouth 2 (two) times daily.  . ONE TOUCH ULTRA TEST test strip 1 each by Other route 4 (four) times daily.  . potassium chloride SA (K-DUR,KLOR-CON) 20 MEQ tablet Take 1 tablet (20 mEq total) by mouth daily.  Marland Kitchen rOPINIRole (REQUIP) 0.5 MG tablet Take 0.5 mg by mouth at bedtime.   . senna-docusate (SENOKOT-S) 8.6-50 MG per tablet Take 1 tablet by mouth at bedtime as needed for mild constipation.  . simvastatin (ZOCOR) 40 MG tablet Take 1 tablet (40 mg total) by mouth at bedtime.  Marland Kitchen warfarin (COUMADIN) 1 MG tablet Take 1 mg by mouth See admin instructions. once a day on Sun, Mon, Tue, Thu, Sat  . warfarin (COUMADIN) 2 MG tablet Take 2 mg by mouth See admin instructions. Once A Day on Wed, Fri  . [DISCONTINUED]  warfarin (COUMADIN) 1 MG tablet Take 1 tablet (1 mg total) by mouth daily. (Patient taking differently: Take 1-2 mg by mouth daily. Pt takes 2 mg Monday to Friday, 1 mg Saturday and Sunday.)   No facility-administered encounter medications on file as of 04/27/2018.  SIGNIFICANT DIAGNOSTIC EXAMS   LABS REVIEWED: TODAY:   04-21-18: wbc 5.7; hgb 11.8; hct 34.1; mcv 105.0; plt 104  INR 1.82 04-26-18: INR 1.43  Review of Systems  Constitutional: Negative for malaise/fatigue.  Respiratory: Negative for cough and shortness of breath.   Cardiovascular: Negative for chest pain, palpitations and leg swelling.  Gastrointestinal: Negative for abdominal pain, constipation and heartburn.  Musculoskeletal: Negative for back pain, joint pain and myalgias.  Skin: Negative.   Neurological: Negative for dizziness.  Psychiatric/Behavioral: The patient is not nervous/anxious.     Physical Exam  Constitutional: She is oriented to person, place, and time. She appears well-developed and well-nourished. No distress.  Neck: No thyromegaly present.  Cardiovascular: Normal rate, regular rhythm, normal heart sounds and intact distal pulses.  Pulmonary/Chest: Effort normal and breath sounds normal. No respiratory distress.  Abdominal: Soft. Bowel sounds are normal. She exhibits no distension. There is no tenderness.  Musculoskeletal: Normal range of motion. She exhibits no edema.  Lymphadenopathy:    She has no cervical adenopathy.  Neurological: She is alert and oriented to person, place, and time.  Skin: Skin is warm and dry. She is not diaphoretic.  Psychiatric: She has a normal mood and affect.     ASSESSMENT/ PLAN:  TODAY;   1.  Persistent atrial fibrillation: heart rate stable; will continue coumadin on alternating doses of 1 and 2 mg daily   2. Adult hypothyroidism: is stable will continue synthroid 112 mcg daily   3. GERD without esophagitis: is stable will continue prilosec 20 mg twice  daily   4. Type 2 diabetes mellitus treated with insulin: cbgs are elevated: will continue glipizide 10 mg daily will increase humulin 70/30 to 18 units twice daily   5. Dyslipidemia associated with type 2 diabetes mellitus: is stable will continue zocor 40 mg daily   6. Congestive heart failure: is stable will continue lasix 20 mg twice daily with k+ 20 meq daily and isordil 20 mg daily   7. Diabetic peripheral neuropathy: is stable will continue neurontin 800 mg every 8 hours  8.  CKD stage 3 GFR 30-59: is stable will continue calcitriol 0.5 mcg daily   9. Anemia chronic disease: stable will continue iron daily   10. Chronic gout: is stable will continue allopurinol 100 mg daily uses voltaren gel 2 gm left foot four times daily   11. Restless leg: is stable will continue requip 0.5 mg nightly    MD is aware of resident's narcotic use and is in agreement with current plan of care. We will attempt to wean resident as apropriate   Synthia Innocent NP [facility]  Contact [phone] Monday through Friday 8am- 5pm  After hours call [phone]

## 2018-04-29 ENCOUNTER — Other Ambulatory Visit
Admission: RE | Admit: 2018-04-29 | Discharge: 2018-04-29 | Disposition: A | Discharge status: Home / Self Care | Payer: Medicare Other | Source: Ambulatory Visit | Attending: Internal Medicine | Admitting: Internal Medicine

## 2018-04-29 DIAGNOSIS — Z7901 Long term (current) use of anticoagulants: Secondary | ICD-10-CM | POA: Diagnosis present

## 2018-04-29 DIAGNOSIS — J9690 Respiratory failure, unspecified, unspecified whether with hypoxia or hypercapnia: Secondary | ICD-10-CM | POA: Insufficient documentation

## 2018-04-29 LAB — PROTIME-INR
INR: 1.4
PROTHROMBIN TIME: 17 s — AB (ref 11.4–15.2)

## 2018-05-03 ENCOUNTER — Other Ambulatory Visit: Payer: Self-pay

## 2018-05-03 ENCOUNTER — Non-Acute Institutional Stay (SKILLED_NURSING_FACILITY): Payer: Medicare Other | Admitting: Adult Health

## 2018-05-03 ENCOUNTER — Encounter: Payer: Self-pay | Admitting: Adult Health

## 2018-05-03 ENCOUNTER — Other Ambulatory Visit: Payer: Self-pay | Admitting: Adult Health

## 2018-05-03 DIAGNOSIS — I509 Heart failure, unspecified: Secondary | ICD-10-CM

## 2018-05-03 DIAGNOSIS — Z794 Long term (current) use of insulin: Secondary | ICD-10-CM

## 2018-05-03 DIAGNOSIS — N390 Urinary tract infection, site not specified: Secondary | ICD-10-CM

## 2018-05-03 DIAGNOSIS — E1122 Type 2 diabetes mellitus with diabetic chronic kidney disease: Secondary | ICD-10-CM

## 2018-05-03 DIAGNOSIS — K219 Gastro-esophageal reflux disease without esophagitis: Secondary | ICD-10-CM

## 2018-05-03 DIAGNOSIS — M109 Gout, unspecified: Secondary | ICD-10-CM

## 2018-05-03 DIAGNOSIS — G629 Polyneuropathy, unspecified: Secondary | ICD-10-CM

## 2018-05-03 DIAGNOSIS — I2510 Atherosclerotic heart disease of native coronary artery without angina pectoris: Secondary | ICD-10-CM

## 2018-05-03 DIAGNOSIS — E039 Hypothyroidism, unspecified: Secondary | ICD-10-CM

## 2018-05-03 DIAGNOSIS — N183 Chronic kidney disease, stage 3 unspecified: Secondary | ICD-10-CM

## 2018-05-03 DIAGNOSIS — D509 Iron deficiency anemia, unspecified: Secondary | ICD-10-CM

## 2018-05-03 DIAGNOSIS — K5901 Slow transit constipation: Secondary | ICD-10-CM

## 2018-05-03 DIAGNOSIS — I481 Persistent atrial fibrillation: Secondary | ICD-10-CM

## 2018-05-03 DIAGNOSIS — M19072 Primary osteoarthritis, left ankle and foot: Secondary | ICD-10-CM

## 2018-05-03 DIAGNOSIS — A419 Sepsis, unspecified organism: Secondary | ICD-10-CM

## 2018-05-03 DIAGNOSIS — I4819 Other persistent atrial fibrillation: Secondary | ICD-10-CM

## 2018-05-03 DIAGNOSIS — G2581 Restless legs syndrome: Secondary | ICD-10-CM

## 2018-05-03 MED ORDER — ALLOPURINOL 100 MG PO TABS: 100.0000 mg | ORAL_TABLET | Freq: Every day | ORAL | 0 refills | Status: DC

## 2018-05-03 MED ORDER — GABAPENTIN 800 MG PO TABS: 800.0000 mg | ORAL_TABLET | Freq: Three times a day (TID) | ORAL | 0 refills | Status: DC

## 2018-05-03 MED ORDER — ROPINIROLE HCL 0.5 MG PO TABS: 0.5000 mg | ORAL_TABLET | Freq: Every day | ORAL | 0 refills | Status: DC

## 2018-05-03 MED ORDER — DICLOFENAC SODIUM 1 % TD GEL: 2.0000 g | Freq: Four times a day (QID) | TRANSDERMAL | 0 refills | Status: DC

## 2018-05-03 MED ORDER — CALCITRIOL 0.5 MCG PO CAPS: 0.5000 ug | ORAL_CAPSULE | Freq: Every day | ORAL | 0 refills | Status: DC

## 2018-05-03 MED ORDER — INSULIN NPH ISOPHANE & REGULAR (70-30) 100 UNIT/ML ~~LOC~~ SUSP: 18.0000 [IU] | Freq: Two times a day (BID) | SUBCUTANEOUS | 0 refills | Status: DC

## 2018-05-03 MED ORDER — LEVOTHYROXINE SODIUM 112 MCG PO TABS: 112.0000 ug | ORAL_TABLET | Freq: Every day | ORAL | 0 refills | Status: DC

## 2018-05-03 MED ORDER — WARFARIN SODIUM 1 MG PO TABS: 1.0000 mg | ORAL_TABLET | ORAL | 0 refills | Status: DC

## 2018-05-03 MED ORDER — OMEPRAZOLE 20 MG PO CPDR: 20.0000 mg | DELAYED_RELEASE_CAPSULE | Freq: Two times a day (BID) | ORAL | 0 refills | Status: DC

## 2018-05-03 MED ORDER — FUROSEMIDE 20 MG PO TABS: 20.0000 mg | ORAL_TABLET | Freq: Two times a day (BID) | ORAL | 0 refills | Status: DC

## 2018-05-03 MED ORDER — GLIPIZIDE 10 MG PO TABS: 10.0000 mg | ORAL_TABLET | Freq: Every day | ORAL | 0 refills | Status: DC

## 2018-05-03 MED ORDER — POTASSIUM CHLORIDE CRYS ER 20 MEQ PO TBCR: 20.0000 meq | EXTENDED_RELEASE_TABLET | Freq: Every day | ORAL | 0 refills | Status: DC

## 2018-05-03 MED ORDER — ISOSORBIDE MONONITRATE 20 MG PO TABS: 20.0000 mg | ORAL_TABLET | Freq: Every day | ORAL | 0 refills | Status: DC

## 2018-05-03 MED ORDER — WARFARIN SODIUM 2 MG PO TABS: 2.0000 mg | ORAL_TABLET | ORAL | 0 refills | Status: DC

## 2018-05-03 MED ORDER — TRIMETHOPRIM 100 MG PO TABS: 100.0000 mg | ORAL_TABLET | Freq: Every day | ORAL | 0 refills | Status: DC

## 2018-05-03 MED ORDER — SIMVASTATIN 40 MG PO TABS: 40.0000 mg | ORAL_TABLET | Freq: Every day | ORAL | 0 refills | Status: DC

## 2018-05-03 NOTE — Telephone Encounter (Signed)
Patient will receive prescription upon discharge on 05/04/18 for Hydrocodone APAP 5/325 mg tab 1 po q6h prn. Total of 15 tabs.

## 2018-05-03 NOTE — Progress Notes (Signed)
Location:  [facility] Room Number: 202A Place of Service:  SNF (31) Provider:  Kenard Gower, NP  Patient Care Team: Barbette Reichmann, MD as PCP - General (Internal Medicine)  Extended Emergency Contact Information Primary Emergency Contact: Jeanella Craze [place] of [place] Phone: [phone] Relation: Other Secondary Emergency Contact: Strand,Jerry"Ray" Address: [address]          [place], [place] [address] [place] of [place] Home Phone: [phone] Work Phone: [phone] Relation: Son  Code Status:  FULL  Goals of care: Advanced Directive information Advanced Directives 05/03/2018  Does Patient Have a Medical Advance Directive? No  Type of Advance Directive -  Does patient want to make changes to medical advance directive? -  Copy of Healthcare Power of Attorney in Chart? -  Would patient like information on creating a medical advance directive? No - Patient declined     Chief Complaint  Patient presents with  . Discharge Note    Discharging from SNF 05/04/2018    HPI:  Pt is a 77 y.o. female seen today to be discharged from SNF on 05/04/2018 with Home health PT and OT.  She [facility] been [facility] from a recent hospitalization for E. Coli sepsis. She received IV antibiotics and was discharged on oral Amoxicillin which has been completed. She has PMH of CKD stage 3 , CHF, hypertension, and atrial fibrillation on Coumadin.  Patient was admitted to this facility for short-term rehabilitation after the patient's recent hospitalization.  Patient has completed SNF rehabilitation and therapy has cleared the patient for discharge.   Past Medical History:  Diagnosis Date  . Atrial fibrillation (HCC)   . Atrial flutter (HCC)   . CHF (congestive heart failure) (HCC)   . CKD (chronic kidney disease) stage 3, GFR 30-59 ml/min (HCC)   . Coronary disease   . Dehydration   . Diabetes (HCC)     . Fracture of humeral shaft, right, closed   . Gout   . Hypertension   . Hypothyroidism   . Peripheral neuropathy   . Renal insufficiency   . Right renal artery stenosis (HCC)   . Syncope and collapse   . Vitiligo    Past Surgical History:  Procedure Laterality Date  . BREAST BIOPSY Right 10/2000   negative for cancer  . BREAST EXCISIONAL BIOPSY Bilateral 1970's   neg  . CARDIAC CATHETERIZATION    . CORONARY ARTERY BYPASS GRAFT    . ESOPHAGOGASTRODUODENOSCOPY (EGD) WITH PROPOFOL N/A 06/10/2017   Procedure: ESOPHAGOGASTRODUODENOSCOPY (EGD) WITH PROPOFOL;  Surgeon: Christena Deem, MD;  Location: [facility] ENDOSCOPY;  Service: Endoscopy;  Laterality: N/A;  . FRACTURE SURGERY      Allergies  Allergen Reactions  . Ciprofloxacin Itching and Rash  . Fosamax [Alendronate Sodium] Other (See Comments)    Reaction: Ulcers in mouth and esophagus  . Iron Other (See Comments)    Outpatient Encounter Medications as of 05/03/2018  Medication Sig  . acetaminophen (TYLENOL) 500 MG tablet Take 500 mg by mouth 2 (two) times daily as needed. for pain/ increased temp. May be administered orally, per G-tube if needed or rectally if unable to swallow (separate order). Maximum dose for 24 hours is 3,000 mg from all sources of Acetaminophen/ Tylenol   . allopurinol (ZYLOPRIM) 100 MG tablet Take 1 tablet (100 mg total) by mouth daily.  . calcitRIOL (ROCALTROL) 0.5 MCG capsule Take 1 capsule (0.5 mcg total) by mouth daily.  Marland Kitchen  Cholecalciferol (VITAMIN D) 2000 UNITS CAPS Take 2,000 Units by mouth daily.  . Cyanocobalamin 5000 MCG LOZG Take 5,000 mcg by mouth daily.   . diclofenac sodium (VOLTAREN) 1 % GEL Apply 2 g topically 4 (four) times daily. Apply to left foot  . furosemide (LASIX) 20 MG tablet Take 1 tablet (20 mg total) by mouth 2 (two) times daily.  Marland Kitchen gabapentin (NEURONTIN) 800 MG tablet Take 1 tablet (800 mg total) by mouth every 8 (eight) hours.  Marland Kitchen glipiZIDE (GLUCOTROL) 10 MG tablet Take 1 tablet  (10 mg total) by mouth daily before breakfast.  . HYDROcodone-acetaminophen (NORCO/VICODIN) 5-325 MG tablet Take 1 tablet by mouth every 6 (six) hours as needed for moderate pain or severe pain.  Marland Kitchen insulin NPH-regular Human (NOVOLIN 70/30) (70-30) 100 UNIT/ML injection Inject 18 Units into the skin 2 (two) times daily with a meal.  . isosorbide mononitrate (ISMO,MONOKET) 20 MG tablet Take 1 tablet (20 mg total) by mouth daily.  Marland Kitchen levothyroxine (SYNTHROID, LEVOTHROID) 112 MCG tablet Take 1 tablet (112 mcg total) by mouth daily before breakfast.  . NON FORMULARY Diet Type: NAS, NCS  . omeprazole (PRILOSEC) 20 MG capsule Take 1 capsule (20 mg total) by mouth 2 (two) times daily.  . ONE TOUCH ULTRA TEST test strip 1 each by Other route 4 (four) times daily.  . potassium chloride SA (K-DUR,KLOR-CON) 20 MEQ tablet Take 1 tablet (20 mEq total) by mouth daily.  Marland Kitchen rOPINIRole (REQUIP) 0.5 MG tablet Take 1 tablet (0.5 mg total) by mouth at bedtime.  . senna-docusate (SENOKOT-S) 8.6-50 MG per tablet Take 1 tablet by mouth at bedtime as needed for mild constipation.  . simvastatin (ZOCOR) 40 MG tablet Take 1 tablet (40 mg total) by mouth at bedtime.  Marland Kitchen trimethoprim (TRIMPEX) 100 MG tablet Take 1 tablet (100 mg total) by mouth daily.  Marland Kitchen warfarin (COUMADIN) 1 MG tablet Take 1 tablet (1 mg total) by mouth See admin instructions. once a day on Sun, Mon, Tue, Thu, Sat  . warfarin (COUMADIN) 2 MG tablet Take 1 tablet (2 mg total) by mouth See admin instructions. Once A Day on Wed, Fri  . [DISCONTINUED] allopurinol (ZYLOPRIM) 100 MG tablet Take 1 tablet (100 mg total) by mouth daily.  . [DISCONTINUED] calcitRIOL (ROCALTROL) 0.5 MCG capsule Take 1 capsule (0.5 mcg total) by mouth daily.  . [DISCONTINUED] diclofenac sodium (VOLTAREN) 1 % GEL Apply 2 g topically 4 (four) times daily. Apply to left foot  . [DISCONTINUED] furosemide (LASIX) 20 MG tablet Take 1 tablet (20 mg total) by mouth 2 (two) times daily.  .  [DISCONTINUED] gabapentin (NEURONTIN) 800 MG tablet Take 1 tablet (800 mg total) by mouth every 8 (eight) hours.  . [DISCONTINUED] glipiZIDE (GLUCOTROL) 10 MG tablet Take 1 tablet (10 mg total) by mouth daily before breakfast.  . [DISCONTINUED] insulin NPH-regular Human (NOVOLIN 70/30) (70-30) 100 UNIT/ML injection Inject 18 Units into the skin 2 (two) times daily with a meal.  . [DISCONTINUED] isosorbide mononitrate (ISMO,MONOKET) 20 MG tablet Take 1 tablet (20 mg total) by mouth daily.  . [DISCONTINUED] levothyroxine (SYNTHROID, LEVOTHROID) 112 MCG tablet Take 1 tablet (112 mcg total) by mouth daily before breakfast.  . [DISCONTINUED] omeprazole (PRILOSEC) 20 MG capsule Take 1 capsule (20 mg total) by mouth 2 (two) times daily.  . [DISCONTINUED] potassium chloride SA (K-DUR,KLOR-CON) 20 MEQ tablet Take 1 tablet (20 mEq total) by mouth daily.  . [DISCONTINUED] rOPINIRole (REQUIP) 0.5 MG tablet Take 0.5 mg by mouth at  bedtime.   . [DISCONTINUED] simvastatin (ZOCOR) 40 MG tablet Take 1 tablet (40 mg total) by mouth at bedtime.  . [DISCONTINUED] trimethoprim (TRIMPEX) 100 MG tablet Take 100 mg by mouth daily.  . [DISCONTINUED] warfarin (COUMADIN) 1 MG tablet Take 1 mg by mouth See admin instructions. once a day on Sun, Mon, Tue, Thu, Sat  . [DISCONTINUED] warfarin (COUMADIN) 2 MG tablet Take 2 mg by mouth See admin instructions. Once A Day on Wed, Fri  . [DISCONTINUED] ferrous sulfate 325 (65 FE) MG tablet Take 325 mg by mouth daily with breakfast.   . [DISCONTINUED] HUMULIN 70/30 KWIKPEN (70-30) 100 UNIT/ML PEN Inject 15 Units into the skin 2 (two) times daily.   No facility-administered encounter medications on file as of 05/03/2018.     Review of Systems  GENERAL: No change in appetite, no fatigue, no weight changes, no fever, chills or weakness MOUTH and THROAT: Denies oral discomfort, gingival pain or bleeding, pain from teeth or hoarseness   RESPIRATORY: no cough, SOB, DOE, wheezing,  hemoptysis CARDIAC: No chest pain, edema or palpitations GI: +constipation GU: Denies dysuria, frequency, hematuria, incontinence, or discharge PSYCHIATRIC: Denies feelings of depression or anxiety. No report of hallucinations, insomnia, paranoia, or agitation    Immunization History  Administered Date(s) Administered  . Influenza Inj Mdck Quad Pf 06/19/2016  . Influenza Split 06/17/2015  . Influenza-Unspecified 05/16/2014, 06/01/2017  . Pneumococcal Conjugate-13 07/10/2016  . Pneumococcal Polysaccharide-23 10/17/2015  . Tdap 10/01/2016   Pertinent  Health Maintenance Due  Topic Date Due  . FOOT EXAM  01/20/1951  . OPHTHALMOLOGY EXAM  01/20/1951  . URINE MICROALBUMIN  01/20/1951  . DEXA SCAN  01/21/2004  . INFLUENZA VACCINE  03/24/2018  . HEMOGLOBIN A1C  08/25/2018  . PNA vac Low Risk Adult  Completed   No flowsheet data found. Functional Status Survey:    Vitals:   05/03/18 1056  BP: (!) 151/66  Pulse: (!) 58  Resp: 18  Temp: 97.7 F (36.5 C)  TempSrc: Oral  SpO2: 98%  Weight: 208 lb 14.4 oz (94.8 kg)  Height: 5\' 3"  (1.6 m)   Body mass index is 37 kg/m.  Physical Exam  GENERAL APPEARANCE: Well nourished. In no acute distress. Obese SKIN:  Skin is warm and dry. Has dark discoloration on bilateral lower legs.  MOUTH and THROAT: Lips are without lesions. Oral mucosa is moist and without lesions. Tongue is normal in shape, size, and color and without lesions RESPIRATORY: Breathing is even & unlabored, BS CTAB CARDIAC: RRR, no murmur,no extra heart sounds, no edema GI: Abdomen soft, normal BS, no masses, no tenderness EXTREMITIES: Able to move X 4 extremities PSYCHIATRIC: Alert to self and place, disoriented to time.  Affect and behavior are appropriate  Labs reviewed: Recent Labs    04/14/18 0411 04/15/18 0351 04/17/18 0601  NA 139 137 137  K 3.6 4.0 4.1  CL 103 99 99  CO2 31 32 31  GLUCOSE 79 124* 188*  BUN 34* 32* 30*  CREATININE 1.74* 1.68* 1.52*    CALCIUM 8.8* 8.8* 8.9   Recent Labs    02/20/18 1814 04/11/18 2211 04/14/18 0411  AST 48* 59* 29  ALT 26 26 17   ALKPHOS 100 110 59  BILITOT 1.1 1.9* 1.3*  PROT 7.0 7.1 5.4*  ALBUMIN 3.8 4.1 2.8*   Recent Labs    02/20/18 1814  04/11/18 2211  04/14/18 0411 04/16/18 0521 04/21/18 0527  WBC 5.2   < > 8.5   < >  6.2 5.1 5.7  NEUTROABS 3.6  --  7.4*  --   --   --  3.6  HGB 13.1   < > 14.2   < > 11.5* 11.8* 11.8*  HCT 39.4   < > 41.2   < > 33.1* 33.8* 34.1*  MCV 106.9*   < > 105.2*   < > 104.2* 105.0* 105.0*  PLT 77*   < > 72*   < > 58* 72* 104*   < > = values in this interval not displayed.   Lab Results  Component Value Date   TSH 1.16 04/24/2013   Lab Results  Component Value Date   HGBA1C 9.3 (H) 02/22/2018    Significant Diagnostic Results in last 30 days:  Ct Head Wo Contrast  Result Date: 04/11/2018 CLINICAL DATA:  Unwitnessed fall. Found on bathroom floor by family. Altered mental status. EXAM: CT HEAD WITHOUT CONTRAST CT CERVICAL SPINE WITHOUT CONTRAST TECHNIQUE: Multidetector CT imaging of the head and cervical spine was performed following the standard protocol without intravenous contrast. Multiplanar CT image reconstructions of the cervical spine were also generated. COMPARISON:  Head CT 02/20/2018 FINDINGS: CT HEAD FINDINGS Brain: Generalized atrophy unchanged from prior exam. Similar degree of chronic small vessel ischemia. No intracranial hemorrhage, mass effect, or midline shift. No hydrocephalus. The basilar cisterns are patent. No evidence of territorial infarct or acute ischemia. No extra-axial or intracranial fluid collection. Minimal air in the region of the cavernous sinus, typically seen with IV catheter placement. Vascular: Atherosclerosis of skullbase vasculature without hyperdense vessel or abnormal calcification. Skull: No fracture or focal lesion. Sinuses/Orbits: No sinus fluid level or signs of inflammation. Orbits are unremarkable. Other: None. CT  CERVICAL SPINE FINDINGS Alignment: Normal. Skull base and vertebrae: No acute fracture. Vertebral body heights are maintained. The dens and skull base are intact. Soft tissues and spinal canal: No prevertebral fluid or swelling. No visible canal hematoma. Disc levels: Mild diffuse endplate spurring. Disc space narrowing at C6-C7. Scattered facet arthropathy. Upper chest: Carotid calcifications. Other: None. IMPRESSION: 1. No acute intracranial abnormality. Unchanged atrophy and chronic small vessel ischemia. 2. Degenerative change in the cervical spine without acute fracture or subluxation. Electronically Signed   By: Rubye Oaks M.D.   On: 04/11/2018 23:08   Ct Cervical Spine Wo Contrast  Result Date: 04/11/2018 CLINICAL DATA:  Unwitnessed fall. Found on bathroom floor by family. Altered mental status. EXAM: CT HEAD WITHOUT CONTRAST CT CERVICAL SPINE WITHOUT CONTRAST TECHNIQUE: Multidetector CT imaging of the head and cervical spine was performed following the standard protocol without intravenous contrast. Multiplanar CT image reconstructions of the cervical spine were also generated. COMPARISON:  Head CT 02/20/2018 FINDINGS: CT HEAD FINDINGS Brain: Generalized atrophy unchanged from prior exam. Similar degree of chronic small vessel ischemia. No intracranial hemorrhage, mass effect, or midline shift. No hydrocephalus. The basilar cisterns are patent. No evidence of territorial infarct or acute ischemia. No extra-axial or intracranial fluid collection. Minimal air in the region of the cavernous sinus, typically seen with IV catheter placement. Vascular: Atherosclerosis of skullbase vasculature without hyperdense vessel or abnormal calcification. Skull: No fracture or focal lesion. Sinuses/Orbits: No sinus fluid level or signs of inflammation. Orbits are unremarkable. Other: None. CT CERVICAL SPINE FINDINGS Alignment: Normal. Skull base and vertebrae: No acute fracture. Vertebral body heights are  maintained. The dens and skull base are intact. Soft tissues and spinal canal: No prevertebral fluid or swelling. No visible canal hematoma. Disc levels: Mild diffuse endplate spurring. Disc space narrowing  at C6-C7. Scattered facet arthropathy. Upper chest: Carotid calcifications. Other: None. IMPRESSION: 1. No acute intracranial abnormality. Unchanged atrophy and chronic small vessel ischemia. 2. Degenerative change in the cervical spine without acute fracture or subluxation. Electronically Signed   By: Rubye Oaks M.D.   On: 04/11/2018 23:08   US Renal  Result Date: 04/12/2018 CLINICAL DATA:  Acute renal failure. EXAM: RENAL / URINARY TRACT ULTRASOUND COMPLETE COMPARISON:  Abdominal ultrasound 10/08/2016 FINDINGS: Examination was limited by patient condition and positioning. Right Kidney: Length: 11.2 cm. Suboptimal assessment of the right kidney without hydronephrosis or gross mass. Left Kidney: Length: 9.8 cm. Echogenicity within normal limits. No hydronephrosis. 11.1 cm cyst, similar in size to the prior study. Bladder: Appears normal for degree of bladder distention. IMPRESSION: 1. Suboptimal assessment of the right kidney.  No hydronephrosis. 2. Chronic large left renal cyst. Electronically Signed   By: Sebastian Ache M.D.   On: 04/12/2018 10:59   Dg Chest Port 1 View  Result Date: 04/12/2018 CLINICAL DATA:  PICC placement. EXAM: PORTABLE CHEST 1 VIEW COMPARISON:  Radiograph of April 11, 2018. FINDINGS: Stable cardiomegaly. Atherosclerosis of thoracic aorta is noted. No pneumothorax or pleural effusion is noted. Interval placement of right-sided PICC line with distal tip in expected position of SVC. No acute pulmonary disease is noted. Bony thorax is unremarkable. IMPRESSION: Interval placement of right-sided PICC line with distal tip in expected position of the SVC. Electronically Signed   By: Lupita Raider, M.D.   On: 04/12/2018 13:48   Dg Chest Portable 1 View  Result Date:  04/11/2018 CLINICAL DATA:  Altered mental status EXAM: PORTABLE CHEST 1 VIEW COMPARISON:  02/20/2018 FINDINGS: Post sternotomy changes. Mild cardiomegaly with central vascular congestion. Aortic atherosclerosis. No focal opacity or pleural effusion. No pneumothorax. IMPRESSION: Cardiomegaly with mild central vascular congestion. Electronically Signed   By: Jasmine Pang M.D.   On: 04/11/2018 22:48   [place] Ekg Site Rite  Result Date: 04/12/2018 If Site Rite image not attached, placement could not be confirmed due to current cardiac rhythm.  US Abdomen Limited Ruq  Result Date: 04/16/2018 CLINICAL DATA:  Abdominal pain over the last week. EXAM: ULTRASOUND ABDOMEN LIMITED RIGHT UPPER QUADRANT COMPARISON:  04/12/2018.  10/08/2016. FINDINGS: Gallbladder: Mobile stones and sludge in the gallbladder. No Murphy sign. Wall thickness is normal. No surrounding fluid. Largest stone is 7 mm. Common bile duct: Diameter: 4 mm common normal Liver: Echogenic liver with nodular surface consistent with cirrhosis. No focal lesion or ductal dilatation. Portal vein is patent on color Doppler imaging with normal direction of blood flow towards the liver. IMPRESSION: Mobile stones and sludge in the gallbladder. Largest stone 7 mm. No sonographic evidence cholecystitis, obstruction or ductal dilatation. Chronic findings of cirrhosis. Increased echogenicity. Nodular surface. No focal lesion. Electronically Signed   By: Paulina Fusi M.D.   On: 04/16/2018 09:24    Assessment/Plan . 1. Sepsis secondary to UTI [facility]) - received IV antibiotics and was discharged on oral Amoxicillin which has been completed; she will have Home health PT and OT for therapeutic strengthening exercises for deconditioning   2. Congestive heart failure, unspecified HF chronicity, unspecified heart failure type (HCC) - furosemide (LASIX) 20 MG tablet; Take 1 tablet (20 mg total) by mouth 2 (two) times daily.  Dispense: 60 tablet; Refill: 0 - potassium  chloride SA (K-DUR,KLOR-CON) 20 MEQ tablet; Take 1 tablet (20 mEq total) by mouth daily.  Dispense: 30 tablet; Refill: 0  3. Gout, unspecified cause, unspecified chronicity,  unspecified site - allopurinol (ZYLOPRIM) 100 MG tablet; Take 1 tablet (100 mg total) by mouth daily.  Dispense: 30 tablet; Refill: 0  4. Neuropathy - gabapentin (NEURONTIN) 800 MG tablet; Take 1 tablet (800 mg total) by mouth every 8 (eight) hours.  Dispense: 90 tablet; Refill: 0  5. Type 2 diabetes mellitus with stage 3 chronic kidney disease, with long-term current use of insulin (HCC) - glipiZIDE (GLUCOTROL) 10 MG tablet; Take 1 tablet (10 mg total) by mouth daily before breakfast.  Dispense: 30 tablet; Refill: 0 - insulin NPH-regular Human (NOVOLIN 70/30) (70-30) 100 UNIT/ML injection; Inject 18 Units into the skin 2 (two) times daily with a meal.  Dispense: 15 mL; Refill: 0 Lab Results  Component Value Date   HGBA1C 9.3 (H) 02/22/2018    6. Arteriosclerosis of coronary artery - isosorbide mononitrate (ISMO,MONOKET) 20 MG tablet; Take 1 tablet (20 mg total) by mouth daily.  Dispense: 30 tablet; Refill: 0 - simvastatin (ZOCOR) 40 MG tablet; Take 1 tablet (40 mg total) by mouth at bedtime.  Dispense: 30 tablet; Refill: 0  7. Adult hypothyroidism - levothyroxine (SYNTHROID, LEVOTHROID) 112 MCG tablet; Take 1 tablet (112 mcg total) by mouth daily before breakfast.  Dispense: 30 tablet; Refill: 0 Lab Results  Component Value Date   TSH 1.16 04/24/2013    8. Gastroesophageal reflux disease, esophagitis presence not specified - omeprazole (PRILOSEC) 20 MG capsule; Take 1 capsule (20 mg total) by mouth 2 (two) times daily.  Dispense: 60 capsule; Refill: 0  9. CKD (chronic kidney disease) stage 3, GFR 30-59 ml/min (HCC) - calcitRIOL (ROCALTROL) 0.5 MCG capsule; Take 1 capsule (0.5 mcg total) by mouth daily.  Dispense: 30 capsule; Refill: 0 Lab Results  Component Value Date   CREATININE 1.52 (H) 04/17/2018    10.  Primary osteoarthritis of left foot - diclofenac sodium (VOLTAREN) 1 % GEL; Apply 2 g topically 4 (four) times daily. Apply to left foot  Dispense: 100 g; Refill: 0  11. RLS (restless legs syndrome) - rOPINIRole (REQUIP) 0.5 MG tablet; Take 1 tablet (0.5 mg total) by mouth at bedtime.  Dispense: 30 tablet; Refill: 0  12. Recurrent UTI - trimethoprim (TRIMPEX) 100 MG tablet; Take 1 tablet (100 mg total) by mouth daily.  Dispense: 30 tablet; Refill: 0  13. Persistent atrial fibrillation (HCC) - warfarin (COUMADIN) 1 MG tablet; Take 1 tablet (1 mg total) by mouth See admin instructions. once a day on Sun, Mon, Tue, Thu, Sat  Dispense: 30 tablet; Refill: 0 - warfarin (COUMADIN) 2 MG tablet; Take 1 tablet (2 mg total) by mouth See admin instructions. Once A Day on Wed, Fri  Dispense: 30 tablet; Refill: 0  14. Iron deficiency anemia -  She complained of constipation, will discontinue iron since hgb is stable Lab Results  Component Value Date   HGB 11.8 (L) 04/21/2018   15. Slow transit constipation - will start with Senna plus 8.6-50 mg 2 tabs PO BID X 3 days then 2 tabs Q HS    I have filled out patient's discharge paperwork and written prescriptions.  Patient will receive home health PT and OT.  DME provided:  None  Total discharge time: Greater than 30 minutes Greater than 50% was spent in counseling and coordination of care.   Discharge time involved coordination of the discharge process with social worker, nursing staff and therapy department. Medical justification for home health services verified.    Kenard Gower, NP [facility] [phone] (Monday-Friday  8:00 a.m. - 5:00 p.m.) [phone] (after hours)

## 2018-05-14 ENCOUNTER — Emergency Department: Payer: Medicare Other

## 2018-05-14 ENCOUNTER — Other Ambulatory Visit: Payer: Self-pay

## 2018-05-14 ENCOUNTER — Inpatient Hospital Stay
Admission: EM | Admit: 2018-05-14 | Discharge: 2018-05-19 | DRG: 871 | Disposition: A | Discharge status: Hospice | Payer: Medicare Other | Attending: Internal Medicine | Admitting: Internal Medicine

## 2018-05-14 ENCOUNTER — Encounter: Payer: Self-pay | Admitting: Emergency Medicine

## 2018-05-14 DIAGNOSIS — M25511 Pain in right shoulder: Secondary | ICD-10-CM | POA: Diagnosis present

## 2018-05-14 DIAGNOSIS — I5032 Chronic diastolic (congestive) heart failure: Secondary | ICD-10-CM | POA: Diagnosis present

## 2018-05-14 DIAGNOSIS — Z951 Presence of aortocoronary bypass graft: Secondary | ICD-10-CM

## 2018-05-14 DIAGNOSIS — I2510 Atherosclerotic heart disease of native coronary artery without angina pectoris: Secondary | ICD-10-CM | POA: Diagnosis present

## 2018-05-14 DIAGNOSIS — J9601 Acute respiratory failure with hypoxia: Secondary | ICD-10-CM | POA: Diagnosis present

## 2018-05-14 DIAGNOSIS — R339 Retention of urine, unspecified: Secondary | ICD-10-CM | POA: Diagnosis present

## 2018-05-14 DIAGNOSIS — R0902 Hypoxemia: Secondary | ICD-10-CM

## 2018-05-14 DIAGNOSIS — L80 Vitiligo: Secondary | ICD-10-CM | POA: Diagnosis present

## 2018-05-14 DIAGNOSIS — E1142 Type 2 diabetes mellitus with diabetic polyneuropathy: Secondary | ICD-10-CM | POA: Insufficient documentation

## 2018-05-14 DIAGNOSIS — M109 Gout, unspecified: Secondary | ICD-10-CM | POA: Diagnosis present

## 2018-05-14 DIAGNOSIS — N3001 Acute cystitis with hematuria: Secondary | ICD-10-CM

## 2018-05-14 DIAGNOSIS — I482 Chronic atrial fibrillation: Secondary | ICD-10-CM | POA: Diagnosis present

## 2018-05-14 DIAGNOSIS — Z8262 Family history of osteoporosis: Secondary | ICD-10-CM

## 2018-05-14 DIAGNOSIS — F0390 Unspecified dementia without behavioral disturbance: Secondary | ICD-10-CM | POA: Diagnosis present

## 2018-05-14 DIAGNOSIS — Z87440 Personal history of urinary (tract) infections: Secondary | ICD-10-CM

## 2018-05-14 DIAGNOSIS — R404 Transient alteration of awareness: Secondary | ICD-10-CM

## 2018-05-14 DIAGNOSIS — N179 Acute kidney failure, unspecified: Secondary | ICD-10-CM | POA: Diagnosis not present

## 2018-05-14 DIAGNOSIS — N183 Chronic kidney disease, stage 3 (moderate): Secondary | ICD-10-CM | POA: Diagnosis present

## 2018-05-14 DIAGNOSIS — D696 Thrombocytopenia, unspecified: Secondary | ICD-10-CM | POA: Diagnosis present

## 2018-05-14 DIAGNOSIS — I130 Hypertensive heart and chronic kidney disease with heart failure and stage 1 through stage 4 chronic kidney disease, or unspecified chronic kidney disease: Secondary | ICD-10-CM | POA: Diagnosis present

## 2018-05-14 DIAGNOSIS — E1169 Type 2 diabetes mellitus with other specified complication: Secondary | ICD-10-CM | POA: Insufficient documentation

## 2018-05-14 DIAGNOSIS — I4892 Unspecified atrial flutter: Secondary | ICD-10-CM | POA: Diagnosis present

## 2018-05-14 DIAGNOSIS — A4159 Other Gram-negative sepsis: Secondary | ICD-10-CM | POA: Diagnosis not present

## 2018-05-14 DIAGNOSIS — M1A9XX0 Chronic gout, unspecified, without tophus (tophi): Secondary | ICD-10-CM | POA: Insufficient documentation

## 2018-05-14 DIAGNOSIS — E1122 Type 2 diabetes mellitus with diabetic chronic kidney disease: Secondary | ICD-10-CM | POA: Diagnosis present

## 2018-05-14 DIAGNOSIS — Z8269 Family history of other diseases of the musculoskeletal system and connective tissue: Secondary | ICD-10-CM

## 2018-05-14 DIAGNOSIS — D638 Anemia in other chronic diseases classified elsewhere: Secondary | ICD-10-CM | POA: Insufficient documentation

## 2018-05-14 DIAGNOSIS — R0789 Other chest pain: Secondary | ICD-10-CM | POA: Diagnosis present

## 2018-05-14 DIAGNOSIS — Z823 Family history of stroke: Secondary | ICD-10-CM

## 2018-05-14 DIAGNOSIS — Z7189 Other specified counseling: Secondary | ICD-10-CM | POA: Diagnosis not present

## 2018-05-14 DIAGNOSIS — R531 Weakness: Secondary | ICD-10-CM | POA: Diagnosis not present

## 2018-05-14 DIAGNOSIS — R6520 Severe sepsis without septic shock: Secondary | ICD-10-CM | POA: Diagnosis present

## 2018-05-14 DIAGNOSIS — I701 Atherosclerosis of renal artery: Secondary | ICD-10-CM | POA: Diagnosis present

## 2018-05-14 DIAGNOSIS — N3000 Acute cystitis without hematuria: Secondary | ICD-10-CM | POA: Diagnosis present

## 2018-05-14 DIAGNOSIS — E785 Hyperlipidemia, unspecified: Secondary | ICD-10-CM

## 2018-05-14 DIAGNOSIS — E039 Hypothyroidism, unspecified: Secondary | ICD-10-CM | POA: Diagnosis present

## 2018-05-14 DIAGNOSIS — R17 Unspecified jaundice: Secondary | ICD-10-CM

## 2018-05-14 DIAGNOSIS — Z79899 Other long term (current) drug therapy: Secondary | ICD-10-CM

## 2018-05-14 DIAGNOSIS — Z66 Do not resuscitate: Secondary | ICD-10-CM | POA: Diagnosis not present

## 2018-05-14 DIAGNOSIS — Z6837 Body mass index (BMI) 37.0-37.9, adult: Secondary | ICD-10-CM

## 2018-05-14 DIAGNOSIS — Z8619 Personal history of other infectious and parasitic diseases: Secondary | ICD-10-CM

## 2018-05-14 DIAGNOSIS — Z515 Encounter for palliative care: Secondary | ICD-10-CM | POA: Diagnosis not present

## 2018-05-14 DIAGNOSIS — R52 Pain, unspecified: Secondary | ICD-10-CM | POA: Diagnosis not present

## 2018-05-14 DIAGNOSIS — K219 Gastro-esophageal reflux disease without esophagitis: Secondary | ICD-10-CM | POA: Insufficient documentation

## 2018-05-14 DIAGNOSIS — Z881 Allergy status to other antibiotic agents status: Secondary | ICD-10-CM

## 2018-05-14 DIAGNOSIS — Z794 Long term (current) use of insulin: Secondary | ICD-10-CM

## 2018-05-14 DIAGNOSIS — K5900 Constipation, unspecified: Secondary | ICD-10-CM | POA: Diagnosis not present

## 2018-05-14 DIAGNOSIS — Z7901 Long term (current) use of anticoagulants: Secondary | ICD-10-CM

## 2018-05-14 DIAGNOSIS — R627 Adult failure to thrive: Secondary | ICD-10-CM | POA: Diagnosis present

## 2018-05-14 DIAGNOSIS — K567 Ileus, unspecified: Secondary | ICD-10-CM

## 2018-05-14 DIAGNOSIS — Z9181 History of falling: Secondary | ICD-10-CM

## 2018-05-14 DIAGNOSIS — Z8261 Family history of arthritis: Secondary | ICD-10-CM

## 2018-05-14 DIAGNOSIS — Z79890 Hormone replacement therapy (postmenopausal): Secondary | ICD-10-CM

## 2018-05-14 DIAGNOSIS — Z888 Allergy status to other drugs, medicaments and biological substances status: Secondary | ICD-10-CM

## 2018-05-14 LAB — CBC WITH DIFFERENTIAL/PLATELET
BASOS ABS: 0.1 10*3/uL (ref 0–0.1)
BASOS PCT: 1 %
Eosinophils Absolute: 0 10*3/uL (ref 0–0.7)
Eosinophils Relative: 0 %
HEMATOCRIT: 38 % (ref 35.0–47.0)
HEMOGLOBIN: 13 g/dL (ref 12.0–16.0)
LYMPHS PCT: 5 %
Lymphs Abs: 0.9 10*3/uL — ABNORMAL LOW (ref 1.0–3.6)
MCH: 35.9 pg — ABNORMAL HIGH (ref 26.0–34.0)
MCHC: 34.3 g/dL (ref 32.0–36.0)
MCV: 104.5 fL — AB (ref 80.0–100.0)
MONO ABS: 0.6 10*3/uL (ref 0.2–0.9)
Monocytes Relative: 3 %
NEUTROS ABS: 16 10*3/uL — AB (ref 1.4–6.5)
Neutrophils Relative %: 91 %
Platelets: 76 10*3/uL — ABNORMAL LOW (ref 150–440)
RBC: 3.63 MIL/uL — AB (ref 3.80–5.20)
RDW: 14.9 % — AB (ref 11.5–14.5)
WBC: 17.6 10*3/uL — ABNORMAL HIGH (ref 3.6–11.0)

## 2018-05-14 LAB — COMPREHENSIVE METABOLIC PANEL
ALBUMIN: 3.8 g/dL (ref 3.5–5.0)
ALT: 31 U/L (ref 0–44)
ANION GAP: 16 — AB (ref 5–15)
AST: 53 U/L — ABNORMAL HIGH (ref 15–41)
Alkaline Phosphatase: 109 U/L (ref 38–126)
BILIRUBIN TOTAL: 3.1 mg/dL — AB (ref 0.3–1.2)
BUN: 43 mg/dL — ABNORMAL HIGH (ref 8–23)
CO2: 28 mmol/L (ref 22–32)
Calcium: 9.2 mg/dL (ref 8.9–10.3)
Chloride: 93 mmol/L — ABNORMAL LOW (ref 98–111)
Creatinine, Ser: 2.79 mg/dL — ABNORMAL HIGH (ref 0.44–1.00)
GFR calc Af Amer: 17 mL/min — ABNORMAL LOW (ref >60)
GFR calc non Af Amer: 15 mL/min — ABNORMAL LOW (ref >60)
GLUCOSE: 289 mg/dL — AB (ref 70–99)
POTASSIUM: 3.9 mmol/L (ref 3.5–5.1)
Sodium: 137 mmol/L (ref 135–145)
TOTAL PROTEIN: 6.8 g/dL (ref 6.5–8.1)

## 2018-05-14 LAB — URINALYSIS, COMPLETE (UACMP) WITH MICROSCOPIC
Bilirubin Urine: NEGATIVE
GLUCOSE, UA: NEGATIVE mg/dL
Ketones, ur: NEGATIVE mg/dL
Nitrite: NEGATIVE
Protein, ur: 100 mg/dL — AB
Specific Gravity, Urine: 1.018 (ref 1.005–1.030)
WBC, UA: >50 WBC/hpf — ABNORMAL HIGH (ref 0–5)
pH: 5 (ref 5.0–8.0)

## 2018-05-14 LAB — GLUCOSE, CAPILLARY
Glucose-Capillary: 182 mg/dL — ABNORMAL HIGH (ref 70–99)
Glucose-Capillary: 135 mg/dL — ABNORMAL HIGH (ref 70–99)

## 2018-05-14 LAB — PROTIME-INR
INR: 2.17
Prothrombin Time: 24 seconds — ABNORMAL HIGH (ref 11.4–15.2)

## 2018-05-14 MED ORDER — GABAPENTIN 800 MG PO TABS: 800.0000 mg | ORAL_TABLET | Freq: Three times a day (TID) | ORAL | Status: DC

## 2018-05-14 MED ORDER — SODIUM CHLORIDE 0.9 % IV SOLN
INTRAVENOUS | Status: AC
  Administered 2018-05-14 – 2018-05-15 (×2): via INTRAVENOUS

## 2018-05-14 MED ORDER — WARFARIN SODIUM 2 MG PO TABS
2.0000 mg | ORAL_TABLET | Freq: Every day | ORAL | Status: DC
  Administered 2018-05-14 – 2018-05-17 (×4): 2 mg via ORAL
  Filled 2018-05-14 (×4): qty 1

## 2018-05-14 MED ORDER — WARFARIN SODIUM 1 MG PO TABS: 1.0000 mg | ORAL_TABLET | ORAL | Status: DC

## 2018-05-14 MED ORDER — INSULIN ASPART 100 UNIT/ML ~~LOC~~ SOLN
0.0000 [IU] | Freq: Three times a day (TID) | SUBCUTANEOUS | Status: DC
  Administered 2018-05-14 – 2018-05-15 (×2): 2 [IU] via SUBCUTANEOUS
  Administered 2018-05-15 (×2): 3 [IU] via SUBCUTANEOUS
  Administered 2018-05-16 – 2018-05-18 (×4): 1 [IU] via SUBCUTANEOUS
  Administered 2018-05-18: 2 [IU] via SUBCUTANEOUS
  Filled 2018-05-14 (×9): qty 1

## 2018-05-14 MED ORDER — INSULIN ASPART PROT & ASPART (70-30 MIX) 100 UNIT/ML ~~LOC~~ SUSP
12.0000 [IU] | Freq: Two times a day (BID) | SUBCUTANEOUS | Status: DC
  Administered 2018-05-14 – 2018-05-15 (×3): 12 [IU] via SUBCUTANEOUS
  Filled 2018-05-14 (×3): qty 10

## 2018-05-14 MED ORDER — VITAMIN D 1000 UNITS PO TABS
2000.0000 [IU] | ORAL_TABLET | Freq: Every day | ORAL | Status: DC
  Administered 2018-05-14 – 2018-05-18 (×5): 2000 [IU] via ORAL
  Filled 2018-05-14 (×5): qty 2

## 2018-05-14 MED ORDER — ISOSORBIDE MONONITRATE 20 MG PO TABS
20.0000 mg | ORAL_TABLET | Freq: Every day | ORAL | Status: DC
  Administered 2018-05-14 – 2018-05-17 (×4): 20 mg via ORAL
  Filled 2018-05-14 (×5): qty 1

## 2018-05-14 MED ORDER — HYDROCODONE-ACETAMINOPHEN 5-325 MG PO TABS
1.0000 | ORAL_TABLET | Freq: Four times a day (QID) | ORAL | Status: DC | PRN
  Administered 2018-05-14 – 2018-05-18 (×5): 1 via ORAL
  Filled 2018-05-14 (×5): qty 1

## 2018-05-14 MED ORDER — WARFARIN SODIUM 2 MG PO TABS: 2.0000 mg | ORAL_TABLET | ORAL | Status: DC

## 2018-05-14 MED ORDER — SENNOSIDES-DOCUSATE SODIUM 8.6-50 MG PO TABS: 1.0000 | ORAL_TABLET | Freq: Every evening | ORAL | Status: DC | PRN

## 2018-05-14 MED ORDER — ROPINIROLE HCL 1 MG PO TABS
0.5000 mg | ORAL_TABLET | Freq: Every day | ORAL | Status: DC
  Administered 2018-05-14 – 2018-05-17 (×4): 0.5 mg via ORAL
  Filled 2018-05-14 (×4): qty 1

## 2018-05-14 MED ORDER — CALCITRIOL 0.25 MCG PO CAPS
0.5000 ug | ORAL_CAPSULE | Freq: Every day | ORAL | Status: DC
  Administered 2018-05-14 – 2018-05-18 (×5): 0.5 ug via ORAL
  Filled 2018-05-14 (×6): qty 2

## 2018-05-14 MED ORDER — DICLOFENAC SODIUM 1 % TD GEL
2.0000 g | Freq: Four times a day (QID) | TRANSDERMAL | Status: DC
  Administered 2018-05-14 – 2018-05-19 (×19): 2 g via TOPICAL
  Filled 2018-05-14: qty 100

## 2018-05-14 MED ORDER — SIMVASTATIN 40 MG PO TABS
40.0000 mg | ORAL_TABLET | Freq: Every day | ORAL | Status: DC
  Administered 2018-05-14 – 2018-05-17 (×4): 40 mg via ORAL
  Filled 2018-05-14 (×5): qty 1

## 2018-05-14 MED ORDER — GABAPENTIN 400 MG PO CAPS
400.0000 mg | ORAL_CAPSULE | Freq: Two times a day (BID) | ORAL | Status: DC
  Administered 2018-05-14 – 2018-05-15 (×3): 400 mg via ORAL
  Filled 2018-05-14 (×3): qty 1

## 2018-05-14 MED ORDER — SODIUM CHLORIDE 0.9 % IV SOLN
1.0000 g | INTRAVENOUS | Status: DC
  Administered 2018-05-15 – 2018-05-16 (×2): 1 g via INTRAVENOUS
  Filled 2018-05-14 (×3): qty 1

## 2018-05-14 MED ORDER — INSULIN ASPART 100 UNIT/ML ~~LOC~~ SOLN: 0.0000 [IU] | Freq: Every day | SUBCUTANEOUS | Status: DC

## 2018-05-14 MED ORDER — SODIUM CHLORIDE 0.9 % IV SOLN
2.0000 g | Freq: Once | INTRAVENOUS | Status: AC
  Administered 2018-05-14: 2 g via INTRAVENOUS
  Filled 2018-05-14: qty 2

## 2018-05-14 MED ORDER — VITAMIN B-12 1000 MCG PO TABS
5000.0000 ug | ORAL_TABLET | Freq: Every day | ORAL | Status: DC
  Administered 2018-05-14 – 2018-05-18 (×5): 5000 ug via ORAL
  Filled 2018-05-14 (×5): qty 5

## 2018-05-14 MED ORDER — SODIUM CHLORIDE 0.9 % IV BOLUS
500.0000 mL | Freq: Once | INTRAVENOUS | Status: AC
  Administered 2018-05-14: 500 mL via INTRAVENOUS

## 2018-05-14 MED ORDER — PANTOPRAZOLE SODIUM 40 MG PO TBEC
40.0000 mg | DELAYED_RELEASE_TABLET | Freq: Every day | ORAL | Status: DC
  Administered 2018-05-14 – 2018-05-18 (×5): 40 mg via ORAL
  Filled 2018-05-14 (×5): qty 1

## 2018-05-14 MED ORDER — LEVOTHYROXINE SODIUM 112 MCG PO TABS
112.0000 ug | ORAL_TABLET | Freq: Every day | ORAL | Status: DC
  Administered 2018-05-15 – 2018-05-18 (×4): 112 ug via ORAL
  Filled 2018-05-14 (×4): qty 1

## 2018-05-14 MED ORDER — ONDANSETRON HCL 4 MG PO TABS: 4.0000 mg | ORAL_TABLET | Freq: Four times a day (QID) | ORAL | Status: DC | PRN

## 2018-05-14 MED ORDER — ONDANSETRON HCL 4 MG/2ML IJ SOLN: 4.0000 mg | Freq: Four times a day (QID) | INTRAMUSCULAR | Status: DC | PRN

## 2018-05-14 NOTE — Progress Notes (Addendum)
Family Meeting Note  Advance Directive:yes  Today a meeting took place with the Patient, son and daughter- in- law at bed side.  Patient is unable to participate due FA:[id number]:Lacked capacity altered   The following clinical team members were present during this meeting:MD  The following were discussed:Patient's diagnosis: Sepsis, acute cystitis, acute kidney injury on chronic kidney disease stage III, atrial for ablation with RVR, chronic thrombocytopenia, delirium and treatment plan of care discussed in detail with the patient and her son and daughter-in-law at bedside.  Other comorbidities as documented below are also discussed  Comorbidities   atrial fibrillation on Coumadin Chronic diastolic congestive heart failure Chronic kidney disease stage III Coronary artery disease status post CABG Hypertension Insulin-dependent diabetes mellitus Hypothyroidism Diabetic neuropathy   patient's progosis: Unable to determine and Goals for treatment: Full Code.  Son Ernestine McmurrayJerry Aloisi healthcare power of attorney  Additional follow-up to be provided: Hospitalist  Time spent during discussion:17 min  Ramonita LabAruna Riannah Stagner, MD

## 2018-05-14 NOTE — ED Triage Notes (Signed)
Pt arrived from where pt lives with family. Pt got up in the middle of the night to use the bathroom and had a fall that resulted in injury to right shoulder. Pt's family wanted her to be evaluated for possible injury. Pt currently on second antibiotic for UTI. Pt started antibiotic 3 days prior per ems report. Pt alert on arrival in NAD. No fever, HR 84, R 16

## 2018-05-14 NOTE — Progress Notes (Signed)
Pharmacy Antibiotic Note  Haley RiggerJean S Hill is a 77 y.o. female admitted on 05/14/2018 with UTI.  Pharmacy has been consulted for cefepime dosing. Patient received cefepime 2g IV x 1 in ED  Plan: Will continue cefepime 1g IV daily  Height: 5\' 3"  (160 cm) Weight: 208 lb 14.4 oz (94.8 kg) IBW/kg (Calculated) : 52.4  Temp (24hrs), Avg:97.8 F (36.6 C), Min:97.8 F (36.6 C), Max:97.8 F (36.6 C)  Recent Labs  Lab 05/14/18 0841  WBC 17.6*  CREATININE 2.79*    Estimated Creatinine Clearance: 17.9 mL/min (A) (by C-G formula based on SCr of 2.79 mg/dL (H)).    Allergies  Allergen Reactions  . Ciprofloxacin Itching and Rash  . Fosamax [Alendronate Sodium] Other (See Comments)    Reaction: Ulcers in mouth and esophagus  . Iron Other (See Comments)   Thank you for allowing pharmacy to be a part of this patient's care.  Thomasene Rippleavid Elizeth Weinrich, PharmD, BCPS Clinical Pharmacist 05/14/2018

## 2018-05-14 NOTE — H&P (Signed)
[facility] NAME: Haley Hill    MR#:  [id number]  DATE OF BIRTH:  04/27/1941  DATE OF ADMISSION:  05/14/2018  PRIMARY CARE PHYSICIAN: Tracie Harrier, MD   REQUESTING/REFERRING PHYSICIAN: Dr. Quentin Cornwall  CHIEF COMPLAINT:   Generalized weakness decreased p.o. intake HISTORY OF PRESENT ILLNESS:  Haley Hill  is a 77 y.o. female with a known history of chronic atrial for ablation on Coumadin INR is therapeutic, chronic diastolic congestive heart failure, chronic kidney disease stage III, coronary artery disease, insulin requiring diabetes mellitus, gout and multiple other medical problems is brought into the emergency department by her son after she sustained a fall.  Patient was just admitted to the hospital with a UTI diagnosed with the pansensitive E. coli and discharged to rehab center with antibiotics.  Patient has completed the antibiotic course and was released from rehabilitation center to home approximately 1 week ago.  Patient was seen by primary care physician yesterday and she was started on another antibiotic by mouth for recurrent UTI as reported by the son.  Patient was found to be extremely dry and lethargic.  Urinalysis is abnormal.  CT head densities C-spine with no acute findings.  Hospitalist team is called to admit the patient.  The patient is alert unable to give any history  PAST MEDICAL HISTORY:   Past Medical History:  Diagnosis Date  . Atrial fibrillation ([place])   . Atrial flutter ([place])   . CHF (congestive heart failure) ([place])   . CKD (chronic kidney disease) stage 3, GFR 30-59 ml/min ([facility])   . Coronary disease   . Dehydration   . Diabetes ([place])   . Fracture of humeral shaft, right, closed   . Gout   . Hypertension   . Hypothyroidism   . Peripheral neuropathy   . Renal insufficiency   . Right renal artery stenosis ([place])   . Syncope and collapse   . Vitiligo     PAST SURGICAL HISTOIRY:   Past  Surgical History:  Procedure Laterality Date  . BREAST BIOPSY Right 10/2000   negative for cancer  . BREAST EXCISIONAL BIOPSY Bilateral 1970's   neg  . CARDIAC CATHETERIZATION    . CORONARY ARTERY BYPASS GRAFT    . ESOPHAGOGASTRODUODENOSCOPY (EGD) WITH PROPOFOL N/A 06/10/2017   Procedure: ESOPHAGOGASTRODUODENOSCOPY (EGD) WITH PROPOFOL;  Surgeon: Lollie Sails, MD;  Location: [facility] ENDOSCOPY;  Service: Endoscopy;  Laterality: N/A;  . FRACTURE SURGERY      SOCIAL HISTORY:   Social History   Tobacco Use  . Smoking status: Never Smoker  . Smokeless tobacco: Never Used  Substance Use Topics  . Alcohol use: No    FAMILY HISTORY:   Family History  Problem Relation Age of Onset  . Gout Other   . Stroke Other   . Arthritis Other   . Osteoporosis Other     DRUG ALLERGIES:   Allergies  Allergen Reactions  . Ciprofloxacin Itching and Rash  . Fosamax [Alendronate Sodium] Other (See Comments)    Reaction: Ulcers in mouth and esophagus  . Iron Other (See Comments)    REVIEW OF SYSTEMS:  Review of system unobtainable  MEDICATIONS AT HOME:   Prior to Admission medications   Medication Sig Start Date End Date Taking? Authorizing Provider  acetaminophen (TYLENOL) 500 MG tablet Take 500 mg by mouth 2 (two) times daily as needed. for pain/ increased temp. May be administered orally, per G-tube if needed or rectally if  unable to swallow (separate order). Maximum dose for 24 hours is 3,000 mg from all sources of Acetaminophen/ Tylenol     [provider]  allopurinol (ZYLOPRIM) 100 MG tablet Take 1 tablet (100 mg total) by mouth daily. 05/03/18   Medina-Vargas, Monina C, NP  calcitRIOL (ROCALTROL) 0.5 MCG capsule Take 1 capsule (0.5 mcg total) by mouth daily. 05/03/18   Medina-Vargas, Monina C, NP  Cholecalciferol (VITAMIN D) 2000 UNITS CAPS Take 2,000 Units by mouth daily.    [provider]  Cyanocobalamin 5000 MCG LOZG Take 5,000 mcg by mouth daily.     [provider]  diclofenac sodium (VOLTAREN) 1 % GEL Apply 2 g topically 4 (four) times daily. Apply to left foot 05/03/18   Medina-Vargas, Monina C, NP  furosemide (LASIX) 20 MG tablet Take 1 tablet (20 mg total) by mouth 2 (two) times daily. 05/03/18   Medina-Vargas, Monina C, NP  gabapentin (NEURONTIN) 800 MG tablet Take 1 tablet (800 mg total) by mouth every 8 (eight) hours. 05/03/18   Medina-Vargas, Monina C, NP  glipiZIDE (GLUCOTROL) 10 MG tablet Take 1 tablet (10 mg total) by mouth daily before breakfast. 05/03/18   Medina-Vargas, Monina C, NP  HYDROcodone-acetaminophen (NORCO/VICODIN) 5-325 MG tablet Take 1 tablet by mouth every 6 (six) hours as needed for moderate pain or severe pain. 04/18/18   Gladstone Lighter, MD  insulin NPH-regular Human (NOVOLIN 70/30) (70-30) 100 UNIT/ML injection Inject 18 Units into the skin 2 (two) times daily with a meal. 05/03/18   Medina-Vargas, Monina C, NP  isosorbide mononitrate (ISMO,MONOKET) 20 MG tablet Take 1 tablet (20 mg total) by mouth daily. 05/03/18   Medina-Vargas, Monina C, NP  levothyroxine (SYNTHROID, LEVOTHROID) 112 MCG tablet Take 1 tablet (112 mcg total) by mouth daily before breakfast. 05/03/18   Medina-Vargas, Monina C, NP  NON FORMULARY Diet Type: NAS, NCS    [provider]  omeprazole (PRILOSEC) 20 MG capsule Take 1 capsule (20 mg total) by mouth 2 (two) times daily. 05/03/18   Medina-Vargas, Monina C, NP  ONE TOUCH ULTRA TEST test strip 1 each by Other route 4 (four) times daily. 03/08/18   Medina-Vargas, Monina C, NP  potassium chloride SA (K-DUR,KLOR-CON) 20 MEQ tablet Take 1 tablet (20 mEq total) by mouth daily. 05/03/18   Medina-Vargas, Monina C, NP  rOPINIRole (REQUIP) 0.5 MG tablet Take 1 tablet (0.5 mg total) by mouth at bedtime. 05/03/18   Medina-Vargas, Monina C, NP  senna-docusate (SENOKOT-S) 8.6-50 MG per tablet Take 1 tablet by mouth at bedtime as needed for mild constipation. 05/02/15   Tracie Harrier, MD  simvastatin (ZOCOR)  40 MG tablet Take 1 tablet (40 mg total) by mouth at bedtime. 05/03/18   Medina-Vargas, Monina C, NP  trimethoprim (TRIMPEX) 100 MG tablet Take 1 tablet (100 mg total) by mouth daily. 05/03/18   Medina-Vargas, Monina C, NP  warfarin (COUMADIN) 1 MG tablet Take 1 tablet (1 mg total) by mouth See admin instructions. once a day on Sun, Mon, Tue, Thu, Sat 05/03/18   Medina-Vargas, Monina C, NP  warfarin (COUMADIN) 2 MG tablet Take 1 tablet (2 mg total) by mouth See admin instructions. Once A Day on Wed, Fri 05/03/18   Medina-Vargas, Monina C, NP      VITAL SIGNS:  Blood pressure (!) 143/86, pulse 85, temperature 97.8 F (36.6 C), temperature source Oral, resp. rate 16, height _0  (1.6 m), weight 94.8 kg, SpO2 98 %.  PHYSICAL EXAMINATION:  GENERAL:  77  y.o.-year-old patient lying in the bed with no acute distress.  EYES: Pupils equal, round, reactive to light and accommodation. No scleral icterus. Extraocular muscles intact.  HEENT: Head atraumatic, normocephalic. Oropharynx and nasopharynx clear.  Extremely dry mucous membranes NECK:  Supple, no jugular venous distention. No thyroid enlargement, no tenderness.  LUNGS: Normal breath sounds bilaterally, no wheezing, rales,rhonchi or crepitation. No use of accessory muscles of respiration.  CARDIOVASCULAR: Irregularly irregular no murmurs, rubs, or gallops.  ABDOMEN: Soft, nontender, nondistended. Bowel sounds present. No organomegaly or mass.  EXTREMITIES: Bilateral lower extremities with chronic venous changes no pedal edema, cyanosis, or clubbing.  NEUROLOGIC: Awake and alert but disoriented  pSYCHIATRIC: The patient is alert and delirious SKIN: No obvious rash, lesion, or ulcer.   LABORATORY PANEL:   CBC Recent Labs  Lab 05/14/18 0841  WBC 17.6*  HGB 13.0  HCT 38.0  PLT 76*   ------------------------------------------------------------------------------------------------------------------  Chemistries  Recent Labs  Lab  05/14/18 0841  NA 137  K 3.9  CL 93*  CO2 28  GLUCOSE 289*  BUN 43*  CREATININE 2.79*  CALCIUM 9.2  AST 53*  ALT 31  ALKPHOS 109  BILITOT 3.1*   ------------------------------------------------------------------------------------------------------------------  Cardiac Enzymes No results for input(s): TROPONINI in the last 168 hours. ------------------------------------------------------------------------------------------------------------------  RADIOLOGY:  Dg Shoulder Right  Result Date: 05/14/2018 CLINICAL DATA:  Recent fall, decreased range of motion history of prior humerus fracture EXAM: RIGHT SHOULDER - 2+ VIEW COMPARISON:  05/14/2018 FINDINGS: Bones are osteopenic. Normal right shoulder alignment without subluxation or dislocation. AC joint also aligned. Healed fracture noted of the right mid humerus shaft. Visualize right chest unremarkable. IMPRESSION: No acute osseous finding. Osteopenia Remote healed right humerus shaft fracture with deformity Electronically Signed   By: Jerilynn Mages.  Shick M.D.   On: 05/14/2018 09:21   Ct Head Wo Contrast  Result Date: 05/14/2018 CLINICAL DATA:  Fall, on antibiotics for UTI EXAM: CT HEAD WITHOUT CONTRAST CT CERVICAL SPINE WITHOUT CONTRAST TECHNIQUE: Multidetector CT imaging of the head and cervical spine was performed following the standard protocol without intravenous contrast. Multiplanar CT image reconstructions of the cervical spine were also generated. COMPARISON:  04/11/2018 FINDINGS: CT HEAD FINDINGS Brain: No evidence of acute infarction, hemorrhage, hydrocephalus, extra-axial collection or mass lesion/mass effect. Subcortical Cherney matter and periventricular small vessel ischemic changes. Vascular: Intracranial atherosclerosis. Skull: Normal. Negative for fracture or focal lesion. Sinuses/Orbits: The visualized paranasal sinuses are essentially clear. The mastoid air cells are unopacified. Other: None. CT CERVICAL SPINE FINDINGS Alignment:  Normal cervical lordosis. Skull base and vertebrae: No acute fracture. No primary bone lesion or focal pathologic process. Soft tissues and spinal canal: No prevertebral fluid or swelling. No visible canal hematoma. Disc levels: Mild degenerative changes at C6-7. Spinal canal is patent. Upper chest: Visualized lung apices are clear. Other: Visualized thyroid is unremarkable. IMPRESSION: No evidence of acute intracranial abnormality. Small vessel ischemic changes. No evidence of traumatic injury the cervical spine. Mild degenerative changes at C6-7. Electronically Signed   By: Julian Hy M.D.   On: 05/14/2018 11:28   Ct Cervical Spine Wo Contrast  Result Date: 05/14/2018 CLINICAL DATA:  Fall, on antibiotics for UTI EXAM: CT HEAD WITHOUT CONTRAST CT CERVICAL SPINE WITHOUT CONTRAST TECHNIQUE: Multidetector CT imaging of the head and cervical spine was performed following the standard protocol without intravenous contrast. Multiplanar CT image reconstructions of the cervical spine were also generated. COMPARISON:  04/11/2018 FINDINGS: CT HEAD FINDINGS Brain: No evidence of acute infarction, hemorrhage, hydrocephalus, extra-axial collection or  mass lesion/mass effect. Subcortical Mcmorris matter and periventricular small vessel ischemic changes. Vascular: Intracranial atherosclerosis. Skull: Normal. Negative for fracture or focal lesion. Sinuses/Orbits: The visualized paranasal sinuses are essentially clear. The mastoid air cells are unopacified. Other: None. CT CERVICAL SPINE FINDINGS Alignment: Normal cervical lordosis. Skull base and vertebrae: No acute fracture. No primary bone lesion or focal pathologic process. Soft tissues and spinal canal: No prevertebral fluid or swelling. No visible canal hematoma. Disc levels: Mild degenerative changes at C6-7. Spinal canal is patent. Upper chest: Visualized lung apices are clear. Other: Visualized thyroid is unremarkable. IMPRESSION: No evidence of acute intracranial  abnormality. Small vessel ischemic changes. No evidence of traumatic injury the cervical spine. Mild degenerative changes at C6-7. Electronically Signed   By: Julian Hy M.D.   On: 05/14/2018 11:28   Dg Chest Portable 1 View  Result Date: 05/14/2018 CLINICAL DATA:  77 year old female status post fall from bed this morning. EXAM: PORTABLE CHEST 1 VIEW COMPARISON:  04/12/2018 and earlier. FINDINGS: Portable AP upright view at 0851 hours. Right PICC line seen in August has been removed. Lung volumes are normal. Prior CABG. Stable borderline to mild cardiomegaly. Calcified aortic atherosclerosis with arterial tortuosity. Other mediastinal contours are within normal limits. Visualized tracheal air column is within normal limits. Allowing for portable technique the lungs are clear. Partially visible right humerus shaft deformity, age indeterminate. No acute osseous abnormality identified. IMPRESSION: 1.  No acute cardiopulmonary abnormality. 2. Partially visible right humerus deformity, see dedicated right shoulder and humerus radiographs. 3.  Aortic Atherosclerosis (ICD10-I70.0). Electronically Signed   By: Genevie Ann M.D.   On: 05/14/2018 09:20   Dg Humerus Right  Result Date: 05/14/2018 CLINICAL DATA:  77 year old female status post fall out of bed. EXAM: RIGHT HUMERUS - 2+ VIEW COMPARISON:  Right shoulder series 01/25/2015. FINDINGS: Chronic spiral fracture of the right humeral shaft occurred in 2016 with interval callus formation and healing. Probable solid osseous union although in areas fracture lucency remains visible. Alignment at the right shoulder and elbow appear preserved. No new osseous abnormality identified. IMPRESSION: 2016 spiral fracture of the right humerus shaft with interval healing. No new osseous abnormality identified. Electronically Signed   By: Genevie Ann M.D.   On: 05/14/2018 09:22    EKG:   Orders placed or performed during the hospital encounter of 05/14/18  . ED EKG  . ED EKG   . EKG 12-Lead  . EKG 12-Lead  . EKG 12-Lead  . EKG 12-Lead  . EKG 12-Lead  . EKG 12-Lead    IMPRESSION AND PLAN:     Sepsis-patient met septic criteria with leukocytosis and tachycardia at the time of admission Admit to MedSurg unit Implement sepsis protocol with IV fluids, antibiotics Follow lactic acid and procalcitonin level IV antibiotics cefepime IV fluids Patient is delirious from sepsis continue close monitoring  Acute cystitis-with history of recurrent urinary tract infections Patient is started on cefepime, recently patient was treated with Rocephin for pansensitive E. coli and patient is started on another antibiotic by PCP  Trimethoprim is on hold which was started by primary care physician for UTI prophylaxis  AKI on CKD -3  Hold allopurinol IVF  Avoid nehrotoxins  ch atrial fibrillation with RVR  Rate improved with IV fluids  on Coumadin-therapeutic INR  Chronic diastolic congestive heart failure Not fluid overloaded at this time.  Lasix on hold in view of acute kidney injury Previous ejection fraction from 03/2015 and 50 to 55%  Coronary artery disease status  post CABG  Insulin-dependent diabetes mellitus ISS 70/30 dose reduced to 12 units bid with meals   Hypothyroidism Continue Synthyroid  Diabetic neuropathy-continue gabapentin   All the records are reviewed and case discussed with ED provider. Management plans discussed with the patient, family and they are in agreement.  CODE STATUS:  fc   TOTAL TIME TAKING CARE OF THIS PATIENT: 43 minutes.   Note: This dictation was prepared with Dragon dictation along with smaller phrase technology. Any transcriptional errors that result from this process are unintentional.  Nicholes Mango M.D on 05/14/2018 at 12:20 PM  Between 7am to 6pm - Pager - [phone]  After 6pm go to [email or web address] - password [email or web address] [facility]  Office  [phone]  CC: Primary care physician; Tracie Harrier, MD

## 2018-05-14 NOTE — ED Provider Notes (Signed)
[facility] Emergency Department Provider Note    First MD Initiated Contact with Patient 05/14/18 [phone]     (approximate)  I have reviewed the triage vital signs and the nursing notes.   HISTORY  Chief Complaint Shoulder Pain and Fall  Level V Caveat:  dementia  HPI Haley Hill is a 77 y.o. female history of A. fib flutter heart failure dementia who presents to the ER for generalized weakness and fall.  According to family more confused than previously.  Is now on her second round of antibiotics for reported UTI.  Patient is complaining of right shoulder pain and chest wall pain after mechanical fall.  Did not hit her head.  No neck pain.  Denies any nausea or vomiting.  No numbness or tingling.    Past Medical History:  Diagnosis Date  . Atrial fibrillation (HCC)   . Atrial flutter (HCC)   . CHF (congestive heart failure) (HCC)   . CKD (chronic kidney disease) stage 3, GFR 30-59 ml/min (HCC)   . Coronary disease   . Dehydration   . Diabetes (HCC)   . Fracture of humeral shaft, right, closed   . Gout   . Hypertension   . Hypothyroidism   . Peripheral neuropathy   . Renal insufficiency   . Right renal artery stenosis (HCC)   . Syncope and collapse   . Vitiligo    Family History  Problem Relation Age of Onset  . Gout Other   . Stroke Other   . Arthritis Other   . Osteoporosis Other    Past Surgical History:  Procedure Laterality Date  . BREAST BIOPSY Right 10/2000   negative for cancer  . BREAST EXCISIONAL BIOPSY Bilateral 1970's   neg  . CARDIAC CATHETERIZATION    . CORONARY ARTERY BYPASS GRAFT    . ESOPHAGOGASTRODUODENOSCOPY (EGD) WITH PROPOFOL N/A 06/10/2017   Procedure: ESOPHAGOGASTRODUODENOSCOPY (EGD) WITH PROPOFOL;  Surgeon: Christena Deem, MD;  Location: [facility] ENDOSCOPY;  Service: Endoscopy;  Laterality: N/A;  . FRACTURE SURGERY     Patient Active Problem List   Diagnosis Date Noted  . AKI (acute kidney injury) (HCC)  05/14/2018  . Sepsis secondary to UTI (HCC) 04/12/2018  . Acute hypoxemic respiratory failure (HCC) 04/12/2018  . UTI (urinary tract infection) 02/20/2018  . Hypoxia 04/29/2015  . CKD (chronic kidney disease) stage 3, GFR 30-59 ml/min (HCC) 03/07/2015  . ARF (acute renal failure) (HCC) 01/25/2015  . Fracture of humeral shaft, right, closed 01/25/2015  . Acute renal failure (HCC) 01/25/2015  . Closed fracture of humerus, shaft 01/25/2015  . Congestive heart failure (HCC) 09/25/2014  . AI (aortic incompetence) 02/07/2014  . Edema leg 02/07/2014  . History of cardiac catheterization 02/07/2014  . Renal artery stenosis (HCC) 02/07/2014  . H/O coronary artery bypass surgery 02/07/2014  . Atrial fibrillation (HCC) 01/04/2014  . Arteriosclerosis of coronary artery 01/04/2014  . Type 2 diabetes mellitus (HCC) 01/04/2014  . BP (high blood pressure) 01/04/2014  . HLD (hyperlipidemia) 01/04/2014  . Adult hypothyroidism 01/04/2014  . Neuropathy 01/04/2014      Prior to Admission medications   Medication Sig Start Date End Date Taking? Authorizing Provider  allopurinol (ZYLOPRIM) 100 MG tablet Take 1 tablet (100 mg total) by mouth daily. 05/03/18  Yes Medina-Vargas, Monina C, NP  calcitRIOL (ROCALTROL) 0.5 MCG capsule Take 1 capsule (0.5 mcg total) by mouth daily. 05/03/18  Yes Medina-Vargas, Monina C, NP  cefUROXime (CEFTIN) 250 MG tablet Take 1 tablet  by mouth 2 (two) times daily. 05/12/18  Yes [provider]  Cholecalciferol (VITAMIN D) 2000 UNITS CAPS Take 2,000 Units by mouth daily.   Yes [provider]  Cyanocobalamin 5000 MCG LOZG Take 5,000 mcg by mouth daily.    Yes [provider]  diclofenac sodium (VOLTAREN) 1 % GEL Apply 2 g topically 4 (four) times daily. Apply to left foot 05/03/18  Yes Medina-Vargas, Monina C, NP  furosemide (LASIX) 20 MG tablet Take 1 tablet (20 mg total) by mouth 2 (two) times daily. 05/03/18  Yes Medina-Vargas, Monina C, NP    gabapentin (NEURONTIN) 800 MG tablet Take 1 tablet (800 mg total) by mouth every 8 (eight) hours. 05/03/18  Yes Medina-Vargas, Monina C, NP  glipiZIDE (GLUCOTROL) 10 MG tablet Take 1 tablet (10 mg total) by mouth daily before breakfast. 05/03/18  Yes Medina-Vargas, Monina C, NP  insulin NPH-regular Human (NOVOLIN 70/30) (70-30) 100 UNIT/ML injection Inject 18 Units into the skin 2 (two) times daily with a meal. 05/03/18  Yes Medina-Vargas, Monina C, NP  isosorbide mononitrate (ISMO,MONOKET) 20 MG tablet Take 1 tablet (20 mg total) by mouth daily. 05/03/18  Yes Medina-Vargas, Monina C, NP  levothyroxine (SYNTHROID, LEVOTHROID) 112 MCG tablet Take 1 tablet (112 mcg total) by mouth daily before breakfast. 05/03/18  Yes Medina-Vargas, Monina C, NP  omeprazole (PRILOSEC) 20 MG capsule Take 1 capsule (20 mg total) by mouth 2 (two) times daily. 05/03/18  Yes Medina-Vargas, Monina C, NP  potassium chloride SA (K-DUR,KLOR-CON) 20 MEQ tablet Take 1 tablet (20 mEq total) by mouth daily. 05/03/18  Yes Medina-Vargas, Monina C, NP  rOPINIRole (REQUIP) 0.5 MG tablet Take 1 tablet (0.5 mg total) by mouth at bedtime. 05/03/18  Yes Medina-Vargas, Monina C, NP  simvastatin (ZOCOR) 40 MG tablet Take 1 tablet (40 mg total) by mouth at bedtime. 05/03/18  Yes Medina-Vargas, Monina C, NP  trimethoprim (TRIMPEX) 100 MG tablet Take 1 tablet (100 mg total) by mouth daily. 05/03/18  Yes Medina-Vargas, Monina C, NP  warfarin (COUMADIN) 2 MG tablet Take 1 tablet (2 mg total) by mouth See admin instructions. Once A Day on Wed, Fri Patient taking differently: Take 2 mg by mouth daily.  05/03/18  Yes Medina-Vargas, Monina C, NP  acetaminophen (TYLENOL) 500 MG tablet Take 500 mg by mouth 2 (two) times daily as needed. for pain/ increased temp. May be administered orally, per G-tube if needed or rectally if unable to swallow (separate order). Maximum dose for 24 hours is 3,000 mg from all sources of Acetaminophen/ Tylenol     [provider]  HYDROcodone-acetaminophen (NORCO/VICODIN) 5-325 MG tablet Take 1 tablet by mouth every 6 (six) hours as needed for moderate pain or severe pain. Patient not taking: Reported on 05/14/2018 04/18/18   Enid Baas, MD  ONE TOUCH ULTRA TEST test strip 1 each by Other route 4 (four) times daily. 03/08/18   Medina-Vargas, Monina C, NP  senna-docusate (SENOKOT-S) 8.6-50 MG per tablet Take 1 tablet by mouth at bedtime as needed for mild constipation. 05/02/15   Barbette Reichmann, MD  warfarin (COUMADIN) 1 MG tablet Take 1 tablet (1 mg total) by mouth See admin instructions. once a day on Sun, Mon, Tue, Thu, Sat Patient not taking: Reported on 05/14/2018 05/03/18   Medina-Vargas, Avanell Shackleton C, NP    Allergies Ciprofloxacin; Fosamax [alendronate sodium]; and Iron    Social History Social History   Tobacco Use  . Smoking status: Never Smoker  . Smokeless tobacco: Never Used  Substance Use Topics  . Alcohol use: No  . Drug use: No    Review of Systems Patient denies headaches, rhinorrhea, blurry vision, numbness, shortness of breath, chest pain, edema, cough, abdominal pain, nausea, vomiting, diarrhea, dysuria, fevers, rashes or hallucinations unless otherwise stated above in HPI. ____________________________________________   PHYSICAL EXAM:  VITAL SIGNS: Vitals:   05/14/18 1231 05/14/18 1337  BP: (!) 144/82 121/85  Pulse: 92 99  Resp: 18   Temp:    SpO2: 98%     Constitutional: Alert disoriented x 2.  Eyes: Conjunctivae are normal.  Head: Atraumatic. Nose: No congestion/rhinnorhea. Mouth/Throat: Mucous membranes are moist.   Neck: No stridor. Painless ROM.  Cardiovascular: Normal rate, regular rhythm. Grossly normal heart sounds.  Good peripheral circulation. Respiratory: Normal respiratory effort.  No retractions. Lungs CTAB. Gastrointestinal: Soft and nontender. No distention. No abdominal bruits. No CVA tenderness. Genitourinary:  Musculoskeletal: ttp of right shoulder and  clavicle, no deformity.  No lower extremity tenderness nor edema.  No joint effusions. Neurologic:  Normal speech and language. No gross focal neurologic deficits are appreciated. No facial droop Skin:  Skin is warm, dry and intact. No rash noted. Psychiatric: Mood and affect are normal. Speech and behavior are normal.  ____________________________________________   LABS (all labs ordered are listed, but only abnormal results are displayed)  Results for orders placed or performed during the hospital encounter of 05/14/18 (from the past 24 hour(s))  CBC with Differential/Platelet     Status: Abnormal   Collection Time: 05/14/18  8:41 AM  Result Value Ref Range   WBC 17.6 (H) 3.6 - 11.0 K/uL   RBC 3.63 (L) 3.80 - 5.20 MIL/uL   Hemoglobin 13.0 12.0 - 16.0 g/dL   HCT 16.138.0 09.635.0 - 04.547.0 %   MCV 104.5 (H) 80.0 - 100.0 fL   MCH 35.9 (H) 26.0 - 34.0 pg   MCHC 34.3 32.0 - 36.0 g/dL   RDW 40.914.9 (H) 81.111.5 - 91.414.5 %   Platelets 76 (L) 150 - 440 K/uL   Neutrophils Relative % 91 %   Neutro Abs 16.0 (H) 1.4 - 6.5 K/uL   Lymphocytes Relative 5 %   Lymphs Abs 0.9 (L) 1.0 - 3.6 K/uL   Monocytes Relative 3 %   Monocytes Absolute 0.6 0.2 - 0.9 K/uL   Eosinophils Relative 0 %   Eosinophils Absolute 0.0 0 - 0.7 K/uL   Basophils Relative 1 %   Basophils Absolute 0.1 0 - 0.1 K/uL  Comprehensive metabolic panel     Status: Abnormal   Collection Time: 05/14/18  8:41 AM  Result Value Ref Range   Sodium 137 135 - 145 mmol/L   Potassium 3.9 3.5 - 5.1 mmol/L   Chloride 93 (L) 98 - 111 mmol/L   CO2 28 22 - 32 mmol/L   Glucose, Bld 289 (H) 70 - 99 mg/dL   BUN 43 (H) 8 - 23 mg/dL   Creatinine, Ser 7.822.79 (H) 0.44 - 1.00 mg/dL   Calcium 9.2 8.9 - 95.610.3 mg/dL   Total Protein 6.8 6.5 - 8.1 g/dL   Albumin 3.8 3.5 - 5.0 g/dL   AST 53 (H) 15 - 41 U/L   ALT 31 0 - 44 U/L   Alkaline Phosphatase 109 38 - 126 U/L   Total Bilirubin 3.1 (H) 0.3 - 1.2 mg/dL   GFR calc non Af Amer 15 (L) >60 mL/min   GFR calc Af Amer 17  (L) >60 mL/min   Anion gap 16 (H) 5 -  15  Urinalysis, Complete w Microscopic     Status: Abnormal   Collection Time: 05/14/18  9:23 AM  Result Value Ref Range   Color, Urine AMBER (A) YELLOW   APPearance CLOUDY (A) CLEAR   Specific Gravity, Urine 1.018 1.005 - 1.030   pH 5.0 5.0 - 8.0   Glucose, UA NEGATIVE NEGATIVE mg/dL   Hgb urine dipstick SMALL (A) NEGATIVE   Bilirubin Urine NEGATIVE NEGATIVE   Ketones, ur NEGATIVE NEGATIVE mg/dL   Protein, ur 102 (A) NEGATIVE mg/dL   Nitrite NEGATIVE NEGATIVE   Leukocytes, UA MODERATE (A) NEGATIVE   RBC / HPF 11-20 0 - 5 RBC/hpf   WBC, UA >50 (H) 0 - 5 WBC/hpf   Bacteria, UA RARE (A) NONE SEEN   Squamous Epithelial / LPF 6-10 0 - 5   WBC Clumps PRESENT    Mucus PRESENT    Hyaline Casts, UA PRESENT    Granular Casts, UA PRESENT   Protime-INR     Status: Abnormal   Collection Time: 05/14/18  9:40 AM  Result Value Ref Range   Prothrombin Time 24.0 (H) 11.4 - 15.2 seconds   INR 2.17    ____________________________________________  EKG My review and personal interpretation at Time: 9:33   Indication: fall  Rate: 105  Rhythm: afib Axis: normal Other: nonspeciic st abn, no stemi ____________________________________________  RADIOLOGY  I personally reviewed all radiographic images ordered to evaluate for the above acute complaints and reviewed radiology reports and findings.  These findings were personally discussed with the patient.  Please see medical record for radiology report.  ____________________________________________   PROCEDURES  Procedure(s) performed:  Procedures    Critical Care performed: no ____________________________________________   INITIAL IMPRESSION / ASSESSMENT AND PLAN / ED COURSE  Pertinent labs & imaging results that were available during my care of the patient were reviewed by me and considered in my medical decision making (see chart for details).   DDX: fracture, uti, anemia, dehydration, pna,  ptx  DANYAH GUASTELLA is a 77 y.o. who presents to the ED with symptoms as described above.  Patient is afebrile hemodynamically stable.  Blood will be sent for the above differential.  X-ray imaging will be ordered to evaluate for traumatic injury.  The patient will be placed on continuous pulse oximetry and telemetry for monitoring.  Laboratory evaluation will be sent to evaluate for the above complaints.     Clinical Course as of May 14 1453  Sat May 14, 2018  1010 Patient does have evidence of leukocytosis with persistent UTI and AKI as well.  Family now bedside states that there was an unwitnessed fall.  Given her dementia will order CT imaging to further evaluate for head or neck injury as she is on anticoagulation with Coumadin.  We will start IV antibiotics patient will need to be treated with cefepime as she was just recently admitted to the hospital for urosepsis this past month.   [PR]  1135 CT head and neck with NAICA.  Will discuss case with hospitalist for admission.  Have discussed with the patient and available family all diagnostics and treatments performed thus far and all questions were answered to the best of my ability. The patient demonstrates understanding and agreement with plan.    [PR]    Clinical Course User Index [PR] Willy Eddy, MD     As part of my medical decision making, I reviewed the following data within the electronic MEDICAL RECORD NUMBER Nursing notes reviewed and  incorporated, Labs reviewed, notes from prior ED visits and [place] Controlled Substance Database   ____________________________________________   FINAL CLINICAL IMPRESSION(S) / ED DIAGNOSES  Final diagnoses:  AKI (acute kidney injury) (HCC)  Acute cystitis with hematuria      NEW MEDICATIONS STARTED DURING THIS VISIT:  Current Discharge Medication List       Note:  This document was prepared using Dragon voice recognition software and may include unintentional dictation errors.     Willy Eddy, MD 05/14/18 [phone]

## 2018-05-15 ENCOUNTER — Inpatient Hospital Stay: Payer: Medicare Other

## 2018-05-15 LAB — COMPREHENSIVE METABOLIC PANEL
ALK PHOS: 104 U/L (ref 38–126)
ALT: 27 U/L (ref 0–44)
AST: 45 U/L — AB (ref 15–41)
Albumin: 3.6 g/dL (ref 3.5–5.0)
Anion gap: 15 (ref 5–15)
BUN: 48 mg/dL — AB (ref 8–23)
CALCIUM: 8.8 mg/dL — AB (ref 8.9–10.3)
CO2: 27 mmol/L (ref 22–32)
CREATININE: 3.2 mg/dL — AB (ref 0.44–1.00)
Chloride: 97 mmol/L — ABNORMAL LOW (ref 98–111)
GFR calc Af Amer: 15 mL/min — ABNORMAL LOW (ref >60)
GFR, EST NON AFRICAN AMERICAN: 13 mL/min — AB (ref >60)
GLUCOSE: 182 mg/dL — AB (ref 70–99)
Potassium: 4.2 mmol/L (ref 3.5–5.1)
Sodium: 139 mmol/L (ref 135–145)
TOTAL PROTEIN: 6.9 g/dL (ref 6.5–8.1)
Total Bilirubin: 3.3 mg/dL — ABNORMAL HIGH (ref 0.3–1.2)

## 2018-05-15 LAB — CBC
HCT: 38.9 % (ref 35.0–47.0)
Hemoglobin: 13.2 g/dL (ref 12.0–16.0)
MCH: 35.8 pg — ABNORMAL HIGH (ref 26.0–34.0)
MCHC: 33.8 g/dL (ref 32.0–36.0)
MCV: 105.9 fL — ABNORMAL HIGH (ref 80.0–100.0)
PLATELETS: 76 10*3/uL — AB (ref 150–440)
RBC: 3.67 MIL/uL — ABNORMAL LOW (ref 3.80–5.20)
RDW: 15.2 % — AB (ref 11.5–14.5)
WBC: 11.5 10*3/uL — AB (ref 3.6–11.0)

## 2018-05-15 LAB — CORTISOL-AM, BLOOD: CORTISOL - AM: 42.7 ug/dL — AB (ref 6.7–22.6)

## 2018-05-15 LAB — PROCALCITONIN: Procalcitonin: 10.84 ng/mL

## 2018-05-15 LAB — GLUCOSE, CAPILLARY
GLUCOSE-CAPILLARY: 222 mg/dL — AB (ref 70–99)
GLUCOSE-CAPILLARY: 211 mg/dL — AB (ref 70–99)
GLUCOSE-CAPILLARY: 117 mg/dL — AB (ref 70–99)
Glucose-Capillary: 182 mg/dL — ABNORMAL HIGH (ref 70–99)

## 2018-05-15 LAB — PROTIME-INR
INR: 2.09
Prothrombin Time: 23.3 seconds — ABNORMAL HIGH (ref 11.4–15.2)

## 2018-05-15 MED ORDER — WARFARIN - PHYSICIAN DOSING INPATIENT
Freq: Every day | Status: DC
  Administered 2018-05-17: 18:00:00

## 2018-05-15 MED ORDER — GABAPENTIN 600 MG PO TABS: 600.0000 mg | ORAL_TABLET | Freq: Every day | ORAL | Status: DC

## 2018-05-15 MED ORDER — TAMSULOSIN HCL 0.4 MG PO CAPS
0.4000 mg | ORAL_CAPSULE | Freq: Every day | ORAL | Status: DC
  Administered 2018-05-15 – 2018-05-18 (×4): 0.4 mg via ORAL
  Filled 2018-05-15 (×4): qty 1

## 2018-05-15 MED ORDER — SODIUM CHLORIDE 0.9 % IV SOLN
INTRAVENOUS | Status: DC
  Administered 2018-05-15 – 2018-05-18 (×5): via INTRAVENOUS

## 2018-05-15 NOTE — Progress Notes (Signed)
[facility] [place] [facility]   PATIENT NAME: Haley Hill    MR#:  [id number]  DATE OF BIRTH:  05-27-1941  SUBJECTIVE:  CHIEF COMPLAINT:   Chief Complaint  Patient presents with  . Shoulder Pain  . Fall   Recurrent UTIs, recently treated for that and sent to rehab and went home last week.  Came with persistent weakness and confusion noted to have UTI again.  Also noted to have some urinary retention and postvoid volume of 400 mL.  Patient is drowsy but easily arousable and oriented on arousal.  REVIEW OF SYSTEMS:  CONSTITUTIONAL: No fever,have fatigue or weakness.  EYES: No blurred or double vision.  EARS, NOSE, AND THROAT: No tinnitus or ear pain.  RESPIRATORY: No cough, shortness of breath, wheezing or hemoptysis.  CARDIOVASCULAR: No chest pain, orthopnea, edema.  GASTROINTESTINAL: No nausea, vomiting, diarrhea or abdominal pain.  GENITOURINARY: No dysuria, hematuria.  ENDOCRINE: No polyuria, nocturia,  HEMATOLOGY: No anemia, easy bruising or bleeding SKIN: No rash or lesion. MUSCULOSKELETAL: No joint pain or arthritis.   NEUROLOGIC: No tingling, numbness, generalized weakness.  PSYCHIATRY: No anxiety or depression.   ROS  DRUG ALLERGIES:   Allergies  Allergen Reactions  . Ciprofloxacin Itching and Rash  . Fosamax [Alendronate Sodium] Other (See Comments)    Reaction: Ulcers in mouth and esophagus  . Iron Other (See Comments)    VITALS:  Blood pressure 126/71, pulse 92, temperature (!) 97.2 F (36.2 C), temperature source Oral, resp. rate 20, height 5\' 3"  (1.6 m), weight 94.8 kg, SpO2 97 %.  PHYSICAL EXAMINATION:  GENERAL:  77 y.o.-year-old patient lying in the bed with no acute distress.  EYES: Pupils equal, round, reactive to light and accommodation. No scleral icterus. Extraocular muscles intact.  HEENT: Head atraumatic, normocephalic. Oropharynx and nasopharynx clear.  NECK:  Supple, no jugular venous distention. No thyroid enlargement, no  tenderness.  LUNGS: Normal breath sounds bilaterally, no wheezing, rales,rhonchi or crepitation. No use of accessory muscles of respiration.  CARDIOVASCULAR: S1, S2 normal. No murmurs, rubs, or gallops.  ABDOMEN: Soft, nontender, nondistended. Bowel sounds present. No organomegaly or mass.  EXTREMITIES: No pedal edema, cyanosis, or clubbing.  NEUROLOGIC: Cranial nerves II through XII are intact. Muscle strength 3/5 in all extremities. Sensation intact. Gait not checked. Appears very weak, frail. PSYCHIATRIC: The patient is alert and oriented x 3.  SKIN: No obvious rash, lesion, or ulcer.   Physical Exam LABORATORY PANEL:   CBC Recent Labs  Lab 05/15/18 0604  WBC 11.5*  HGB 13.2  HCT 38.9  PLT 76*   ------------------------------------------------------------------------------------------------------------------  Chemistries  Recent Labs  Lab 05/15/18 0604  NA 139  K 4.2  CL 97*  CO2 27  GLUCOSE 182*  BUN 48*  CREATININE 3.20*  CALCIUM 8.8*  AST 45*  ALT 27  ALKPHOS 104  BILITOT 3.3*   ------------------------------------------------------------------------------------------------------------------  Cardiac Enzymes No results for input(s): TROPONINI in the last 168 hours. ------------------------------------------------------------------------------------------------------------------  RADIOLOGY:  Dg Chest 1 View  Result Date: 05/15/2018 CLINICAL DATA:  Hypoxia. EXAM: CHEST  1 VIEW COMPARISON:  05/14/2018 FINDINGS: 1546 hours. Low lung volumes. The cardio pericardial silhouette is enlarged. Interstitial markings are diffusely coarsened with chronic features. No airspace edema. There is some atelectasis at the left base with potential tiny left effusion. The visualized bony structures of the thorax are intact. Status post median sternotomy. IMPRESSION: Low lung volumes with cardiomegaly and chronic interstitial coarsening. Left base atelectasis with probable tiny left  pleural effusion.  Electronically Signed   By: Kennith Center M.D.   On: 05/15/2018 16:29   Dg Shoulder Right  Result Date: 05/14/2018 CLINICAL DATA:  Recent fall, decreased range of motion history of prior humerus fracture EXAM: RIGHT SHOULDER - 2+ VIEW COMPARISON:  05/14/2018 FINDINGS: Bones are osteopenic. Normal right shoulder alignment without subluxation or dislocation. AC joint also aligned. Healed fracture noted of the right mid humerus shaft. Visualize right chest unremarkable. IMPRESSION: No acute osseous finding. Osteopenia Remote healed right humerus shaft fracture with deformity Electronically Signed   By: Judie Petit.  Shick M.D.   On: 05/14/2018 09:21   Ct Head Wo Contrast  Result Date: 05/14/2018 CLINICAL DATA:  Fall, on antibiotics for UTI EXAM: CT HEAD WITHOUT CONTRAST CT CERVICAL SPINE WITHOUT CONTRAST TECHNIQUE: Multidetector CT imaging of the head and cervical spine was performed following the standard protocol without intravenous contrast. Multiplanar CT image reconstructions of the cervical spine were also generated. COMPARISON:  04/11/2018 FINDINGS: CT HEAD FINDINGS Brain: No evidence of acute infarction, hemorrhage, hydrocephalus, extra-axial collection or mass lesion/mass effect. Subcortical Ashraf matter and periventricular small vessel ischemic changes. Vascular: Intracranial atherosclerosis. Skull: Normal. Negative for fracture or focal lesion. Sinuses/Orbits: The visualized paranasal sinuses are essentially clear. The mastoid air cells are unopacified. Other: None. CT CERVICAL SPINE FINDINGS Alignment: Normal cervical lordosis. Skull base and vertebrae: No acute fracture. No primary bone lesion or focal pathologic process. Soft tissues and spinal canal: No prevertebral fluid or swelling. No visible canal hematoma. Disc levels: Mild degenerative changes at C6-7. Spinal canal is patent. Upper chest: Visualized lung apices are clear. Other: Visualized thyroid is unremarkable. IMPRESSION: No  evidence of acute intracranial abnormality. Small vessel ischemic changes. No evidence of traumatic injury the cervical spine. Mild degenerative changes at C6-7. Electronically Signed   By: Charline Bills M.D.   On: 05/14/2018 11:28   Ct Cervical Spine Wo Contrast  Result Date: 05/14/2018 CLINICAL DATA:  Fall, on antibiotics for UTI EXAM: CT HEAD WITHOUT CONTRAST CT CERVICAL SPINE WITHOUT CONTRAST TECHNIQUE: Multidetector CT imaging of the head and cervical spine was performed following the standard protocol without intravenous contrast. Multiplanar CT image reconstructions of the cervical spine were also generated. COMPARISON:  04/11/2018 FINDINGS: CT HEAD FINDINGS Brain: No evidence of acute infarction, hemorrhage, hydrocephalus, extra-axial collection or mass lesion/mass effect. Subcortical Hitch matter and periventricular small vessel ischemic changes. Vascular: Intracranial atherosclerosis. Skull: Normal. Negative for fracture or focal lesion. Sinuses/Orbits: The visualized paranasal sinuses are essentially clear. The mastoid air cells are unopacified. Other: None. CT CERVICAL SPINE FINDINGS Alignment: Normal cervical lordosis. Skull base and vertebrae: No acute fracture. No primary bone lesion or focal pathologic process. Soft tissues and spinal canal: No prevertebral fluid or swelling. No visible canal hematoma. Disc levels: Mild degenerative changes at C6-7. Spinal canal is patent. Upper chest: Visualized lung apices are clear. Other: Visualized thyroid is unremarkable. IMPRESSION: No evidence of acute intracranial abnormality. Small vessel ischemic changes. No evidence of traumatic injury the cervical spine. Mild degenerative changes at C6-7. Electronically Signed   By: Charline Bills M.D.   On: 05/14/2018 11:28   US Renal  Result Date: 05/15/2018 CLINICAL DATA:  Acute renal failure EXAM: RENAL / URINARY TRACT ULTRASOUND COMPLETE COMPARISON:  04/12/2018 FINDINGS: Right Kidney: Length: 12.5 cm.  Mild cortical thinning. Normal echotexture. No mass or hydronephrosis. Left Kidney: Length: 12.1 cm. Cortical thinning. No hydronephrosis. Large cyst replaces much of the mid and lower pole of the left kidney measuring up to 12 cm. Bladder:  Appears normal for degree of bladder distention. IMPRESSION: No acute findings.  No hydronephrosis. Large left mid and lower pole renal cyst, 12 cm. Electronically Signed   By: Charlett Nose M.D.   On: 05/15/2018 16:35   Dg Chest Portable 1 View  Result Date: 05/14/2018 CLINICAL DATA:  77 year old female status post fall from bed this morning. EXAM: PORTABLE CHEST 1 VIEW COMPARISON:  04/12/2018 and earlier. FINDINGS: Portable AP upright view at 0851 hours. Right PICC line seen in August has been removed. Lung volumes are normal. Prior CABG. Stable borderline to mild cardiomegaly. Calcified aortic atherosclerosis with arterial tortuosity. Other mediastinal contours are within normal limits. Visualized tracheal air column is within normal limits. Allowing for portable technique the lungs are clear. Partially visible right humerus shaft deformity, age indeterminate. No acute osseous abnormality identified. IMPRESSION: 1.  No acute cardiopulmonary abnormality. 2. Partially visible right humerus deformity, see dedicated right shoulder and humerus radiographs. 3.  Aortic Atherosclerosis (ICD10-I70.0). Electronically Signed   By: Odessa Fleming M.D.   On: 05/14/2018 09:20   Dg Humerus Right  Result Date: 05/14/2018 CLINICAL DATA:  77 year old female status post fall out of bed. EXAM: RIGHT HUMERUS - 2+ VIEW COMPARISON:  Right shoulder series 01/25/2015. FINDINGS: Chronic spiral fracture of the right humeral shaft occurred in 2016 with interval callus formation and healing. Probable solid osseous union although in areas fracture lucency remains visible. Alignment at the right shoulder and elbow appear preserved. No new osseous abnormality identified. IMPRESSION: 2016 spiral fracture of  the right humerus shaft with interval healing. No new osseous abnormality identified. Electronically Signed   By: Odessa Fleming M.D.   On: 05/14/2018 09:22   US Abdomen Limited Ruq  Result Date: 05/15/2018 CLINICAL DATA:  Elevated bilirubin EXAM: ULTRASOUND ABDOMEN LIMITED RIGHT UPPER QUADRANT COMPARISON:  04/16/2018 FINDINGS: Gallbladder: Layering gallstones with gallbladder wall thickness measuring up to 5-6 mm. No substantial pericholecystic fluid. Sonographer reports no sonographic Murphy sign. Common bile duct: Diameter: 7 mm, upper normal for patient age. Liver: Heterogeneous liver echotexture with increased echogenicity and decreased acoustic through transmission. Nodular contour again noted. Note: Small volume ascites evident. Portal vein is patent on color Doppler imaging with normal direction of blood flow towards the liver. IMPRESSION: 1. Cholelithiasis with mild gallbladder wall thickening. Correlation for acute cholecystitis suggested. 2. 7 mm common bile duct diameter, upper normal for patient age. Common bile duct measured at 4 mm diameter on the study from 1 month ago. Given interval increase and elevated bilirubin, MRCP may prove helpful. 3. Echogenic liver with nodular contour suggests cirrhosis. Electronically Signed   By: Kennith Center M.D.   On: 05/15/2018 16:34    ASSESSMENT AND PLAN:   Active Problems:   AKI (acute kidney injury) (HCC)   * Sepsis-  with leukocytosis and tachycardia at the time of admission Implement sepsis protocol with IV fluids, antibiotics Follow lactic acid and procalcitonin level IV antibiotics cefepime IV fluids Patient is delirious from sepsis continue close monitoring Sent blood culture which was not sent on admission.  * Acute cystitis-with history of recurrent urinary tract infections Patient also had E. coli bacteremia last time, noted to have 400 mL of post void bladder volume, I would place a Foley catheter and would suggest to go to urologist  after infection clears up.  * AKI on CKD -3  Hold allopurinol IVF  Avoid nehrotoxins  * ch atrial fibrillation with RVR  Rate improved with IV fluids  on Coumadin-therapeutic INR  *  Chronic diastolic congestive heart failure Not fluid overloaded at this time.  Lasix on hold in view of acute kidney injury Previous ejection fraction from 03/2015 and 50 to 55%  * Coronary artery disease status post CABG  * Insulin-dependent diabetes mellitus ISS 70/30 dose reduced to 12 units bid with meals   * Hypothyroidism Continue Synthyroid  * Diabetic neuropathy-continue gabapentin  *Acute respiratory failure with hypoxia We will also check chest x-ray to rule out pneumonia or pulmonary edema as patient has heart failure history.  All the records are reviewed and case discussed with Care Management/Social Workerr. Management plans discussed with the patient, family and they are in agreement.  CODE STATUS: Full.  TOTAL TIME TAKING CARE OF THIS PATIENT: 45 minutes.     POSSIBLE D/C IN 2-3 DAYS, DEPENDING ON CLINICAL CONDITION.   Altamese DillingVaibhavkumar Kelie Gainey M.D on 05/15/2018   Between 7am to 6pm - Pager - [phone]  After 6pm go to [email or web address] - password [email or web address]  [facility]  Office  [phone]  CC: Primary care physician; Barbette ReichmannHande, Vishwanath, MD  Note: This dictation was prepared with Dragon dictation along with smaller phrase technology. Any transcriptional errors that result from this process are unintentional.

## 2018-05-16 LAB — BASIC METABOLIC PANEL
Anion gap: 9 (ref 5–15)
BUN: 61 mg/dL — ABNORMAL HIGH (ref 8–23)
CO2: 25 mmol/L (ref 22–32)
Calcium: 7.7 mg/dL — ABNORMAL LOW (ref 8.9–10.3)
Chloride: 102 mmol/L (ref 98–111)
Creatinine, Ser: 4.53 mg/dL — ABNORMAL HIGH (ref 0.44–1.00)
GFR calc Af Amer: 10 mL/min — ABNORMAL LOW (ref >60)
GFR calc non Af Amer: 8 mL/min — ABNORMAL LOW (ref >60)
Glucose, Bld: 112 mg/dL — ABNORMAL HIGH (ref 70–99)
Potassium: 3.9 mmol/L (ref 3.5–5.1)
Sodium: 136 mmol/L (ref 135–145)

## 2018-05-16 LAB — HEPATIC FUNCTION PANEL
ALT: 20 U/L (ref 0–44)
AST: 32 U/L (ref 15–41)
Albumin: 2.8 g/dL — ABNORMAL LOW (ref 3.5–5.0)
Alkaline Phosphatase: 88 U/L (ref 38–126)
Bilirubin, Direct: 1.5 mg/dL — ABNORMAL HIGH (ref 0.0–0.2)
Indirect Bilirubin: 1 mg/dL — ABNORMAL HIGH (ref 0.3–0.9)
Total Bilirubin: 2.5 mg/dL — ABNORMAL HIGH (ref 0.3–1.2)
Total Protein: 5.5 g/dL — ABNORMAL LOW (ref 6.5–8.1)

## 2018-05-16 LAB — GLUCOSE, CAPILLARY
GLUCOSE-CAPILLARY: 96 mg/dL (ref 70–99)
GLUCOSE-CAPILLARY: 126 mg/dL — AB (ref 70–99)
Glucose-Capillary: 116 mg/dL — ABNORMAL HIGH (ref 70–99)
Glucose-Capillary: 107 mg/dL — ABNORMAL HIGH (ref 70–99)

## 2018-05-16 LAB — CBC
HCT: 32 % — ABNORMAL LOW (ref 35.0–47.0)
Hemoglobin: 11.1 g/dL — ABNORMAL LOW (ref 12.0–16.0)
MCH: 36.5 pg — AB (ref 26.0–34.0)
MCHC: 34.6 g/dL (ref 32.0–36.0)
MCV: 105.6 fL — ABNORMAL HIGH (ref 80.0–100.0)
Platelets: 66 10*3/uL — ABNORMAL LOW (ref 150–440)
RBC: 3.03 MIL/uL — ABNORMAL LOW (ref 3.80–5.20)
RDW: 15.1 % — ABNORMAL HIGH (ref 11.5–14.5)
WBC: 10 10*3/uL (ref 3.6–11.0)

## 2018-05-16 LAB — URINE CULTURE

## 2018-05-16 LAB — PROTIME-INR
INR: 2.26
Prothrombin Time: 24.8 seconds — ABNORMAL HIGH (ref 11.4–15.2)

## 2018-05-16 MED ORDER — ACETAMINOPHEN 325 MG PO TABS: 650.0000 mg | ORAL_TABLET | Freq: Four times a day (QID) | ORAL | Status: DC | PRN

## 2018-05-16 MED ORDER — SODIUM CHLORIDE 0.9 % IV SOLN
1.0000 g | INTRAVENOUS | Status: DC
  Administered 2018-05-17 – 2018-05-18 (×2): 1 g via INTRAVENOUS
  Filled 2018-05-16 (×2): qty 1

## 2018-05-16 MED ORDER — INSULIN ASPART PROT & ASPART (70-30 MIX) 100 UNIT/ML ~~LOC~~ SUSP: 6.0000 [IU] | Freq: Two times a day (BID) | SUBCUTANEOUS | Status: DC

## 2018-05-16 NOTE — Progress Notes (Signed)
[facility] [place], [place] 05/16/18  Subjective:   Patient known to our practice from outpatient follow-up.  She is followed for chronic kidney disease stage  Patient was admitted for management of UTI.  Her son reports that this is her third urinary tract infection in the last 3 months.  She has only been home for about a week and these 3 months. Patient is lethargic and not able to provide any meaningful information.  She is confused Her son reports that she is a little better compared to yesterday Urine culture shows infection with Klebsiella Nephrology consult requested for acute renal failure.  Serum creatinine baseline appears to be 1.52/GFR 31 on August 25 Admission creatinine 2.79 which has worsened to 4.53 today  Objective:  Vital signs in last 24 hours:  Temp:  [98.4 F (36.9 C)-100 F (37.8 C)] 100 F (37.8 C) (09/23 1143) Pulse Rate:  [86-88] 88 (09/23 1143) Resp:  [19-27] 27 (09/23 1143) BP: (113-140)/(54-89) 140/85 (09/23 1143) SpO2:  [92 %-99 %] 99 % (09/23 1143)  Weight change:  Filed Weights   05/14/18 0838  Weight: 94.8 kg    Intake/Output:    Intake/Output Summary (Last 24 hours) at 05/16/2018 1212 Last data filed at 05/16/2018 1610 Gross per 24 hour  Intake 1600.72 ml  Output 1000 ml  Net 600.72 ml     Physical Exam: General:  Laying in the bed, chronically ill-appearing  HEENT  bilateral malar discoloration, anicteric, dry oral mucous membranes  Neck  supple, no JVD  Pulm/lungs  decreased breath sounds at bases, nasal cannula oxygen  CVS/Heart  no rub or gallop  Abdomen:   Soft, mildly distended, mild diffuse tenderness  Extremities:  Congestive discoloration over legs, 1+ edema  Neurologic:  Alert, able to answer few questions, disoriented but able to recognize her son  Skin:  Normal turgor    Foley catheter in place       Basic Metabolic Panel:  Recent Labs  Lab 05/14/18 0841 05/15/18 0604 05/16/18 0633  NA 137  139 136  K 3.9 4.2 3.9  CL 93* 97* 102  CO2 28 27 25   GLUCOSE 289* 182* 112*  BUN 43* 48* 61*  CREATININE 2.79* 3.20* 4.53*  CALCIUM 9.2 8.8* 7.7*     CBC: Recent Labs  Lab 05/14/18 0841 05/15/18 0604 05/16/18 0633  WBC 17.6* 11.5* 10.0  NEUTROABS 16.0*  --   --   HGB 13.0 13.2 11.1*  HCT 38.0 38.9 32.0*  MCV 104.5* 105.9* 105.6*  PLT 76* 76* 66*     No results found for: HEPBSAG, HEPBSAB, HEPBIGM    Microbiology:  Recent Results (from the past 240 hour(s))  Urine Culture     Status: Abnormal   Collection Time: 05/14/18  9:23 AM  Result Value Ref Range Status   Specimen Description   Final    URINE, RANDOM Performed at [facility], [address], [place], [place] [address]    Special Requests   Final    NONE Performed at [facility], [address] [place], [place] [address]    Culture 60,000 COLONIES/mL KLEBSIELLA PNEUMONIAE (A)  Final   Report Status 05/16/2018 FINAL  Final   Organism ID, Bacteria KLEBSIELLA PNEUMONIAE (A)  Final      Susceptibility   Klebsiella pneumoniae - MIC*    AMPICILLIN >=32 RESISTANT Resistant     CEFAZOLIN 8 SENSITIVE Sensitive     CEFTRIAXONE <=1 SENSITIVE Sensitive     CIPROFLOXACIN <=  0.25 SENSITIVE Sensitive     GENTAMICIN <=1 SENSITIVE Sensitive     IMIPENEM <=0.25 SENSITIVE Sensitive     NITROFURANTOIN 128 RESISTANT Resistant     TRIMETH/SULFA >=320 RESISTANT Resistant     AMPICILLIN/SULBACTAM >=32 RESISTANT Resistant     PIP/TAZO >=128 RESISTANT Resistant     Extended ESBL NEGATIVE Sensitive     * 60,000 COLONIES/mL KLEBSIELLA PNEUMONIAE  CULTURE, BLOOD (ROUTINE X 2) w Reflex to ID Panel     Status: None (Preliminary result)   Collection Time: 05/15/18  2:18 PM  Result Value Ref Range Status   Specimen Description BLOOD LT HAND  Final   Special Requests   Final    BOTTLES DRAWN AEROBIC AND ANAEROBIC Blood Culture results may not be optimal due to an excessive volume of blood received in culture  bottles   Culture   Final    NO GROWTH < 24 HOURS Performed at [facility], [address] [place], [place] [address]    Report Status PENDING  Incomplete  CULTURE, BLOOD (ROUTINE X 2) w Reflex to ID Panel     Status: None (Preliminary result)   Collection Time: 05/15/18  2:26 PM  Result Value Ref Range Status   Specimen Description BLOOD RT ARM  Final   Special Requests   Final    BOTTLES DRAWN AEROBIC AND ANAEROBIC Blood Culture results may not be optimal due to an excessive volume of blood received in culture bottles   Culture   Final    NO GROWTH < 24 HOURS Performed at [facility], [address] [place], [place] [address]    Report Status PENDING  Incomplete    Coagulation Studies: Recent Labs    05/14/18 0940 05/15/18 0604 05/16/18 0633  LABPROT 24.0* 23.3* 24.8*  INR 2.17 2.09 2.26    Urinalysis: Recent Labs    05/14/18 0923  COLORURINE AMBER*  LABSPEC 1.018  PHURINE 5.0  GLUCOSEU NEGATIVE  HGBUR SMALL*  BILIRUBINUR NEGATIVE  KETONESUR NEGATIVE  PROTEINUR 100*  NITRITE NEGATIVE  LEUKOCYTESUR MODERATE*      Imaging: Dg Chest 1 View  Result Date: 05/15/2018 CLINICAL DATA:  Hypoxia. EXAM: CHEST  1 VIEW COMPARISON:  05/14/2018 FINDINGS: 1546 hours. Low lung volumes. The cardio pericardial silhouette is enlarged. Interstitial markings are diffusely coarsened with chronic features. No airspace edema. There is some atelectasis at the left base with potential tiny left effusion. The visualized bony structures of the thorax are intact. Status post median sternotomy. IMPRESSION: Low lung volumes with cardiomegaly and chronic interstitial coarsening. Left base atelectasis with probable tiny left pleural effusion. Electronically Signed   By: Kennith CenterEric  Mansell M.D.   On: 05/15/2018 16:29   [place] Renal  Result Date: 05/15/2018 CLINICAL DATA:  Acute renal failure EXAM: RENAL / URINARY TRACT ULTRASOUND COMPLETE COMPARISON:  04/12/2018 FINDINGS: Right  Kidney: Length: 12.5 cm. Mild cortical thinning. Normal echotexture. No mass or hydronephrosis. Left Kidney: Length: 12.1 cm. Cortical thinning. No hydronephrosis. Large cyst replaces much of the mid and lower pole of the left kidney measuring up to 12 cm. Bladder: Appears normal for degree of bladder distention. IMPRESSION: No acute findings.  No hydronephrosis. Large left mid and lower pole renal cyst, 12 cm. Electronically Signed   By: Charlett NoseKevin  Dover M.D.   On: 05/15/2018 16:35   [place] Abdomen Limited Ruq  Result Date: 05/15/2018 CLINICAL DATA:  Elevated bilirubin EXAM: ULTRASOUND ABDOMEN LIMITED RIGHT UPPER QUADRANT COMPARISON:  04/16/2018 FINDINGS: Gallbladder: Layering gallstones with gallbladder  wall thickness measuring up to 5-6 mm. No substantial pericholecystic fluid. Sonographer reports no sonographic Murphy sign. Common bile duct: Diameter: 7 mm, upper normal for patient age. Liver: Heterogeneous liver echotexture with increased echogenicity and decreased acoustic through transmission. Nodular contour again noted. Note: Small volume ascites evident. Portal vein is patent on color Doppler imaging with normal direction of blood flow towards the liver. IMPRESSION: 1. Cholelithiasis with mild gallbladder wall thickening. Correlation for acute cholecystitis suggested. 2. 7 mm common bile duct diameter, upper normal for patient age. Common bile duct measured at 4 mm diameter on the study from 1 month ago. Given interval increase and elevated bilirubin, MRCP may prove helpful. 3. Echogenic liver with nodular contour suggests cirrhosis. Electronically Signed   By: Kennith Center M.D.   On: 05/15/2018 16:34     Medications:   . sodium chloride 75 mL/hr at 05/16/18 0430  . ceFEPime (MAXIPIME) IV 1 g (05/16/18 1035)   . calcitRIOL  0.5 mcg Oral Daily  . cholecalciferol  2,000 Units Oral Daily  . diclofenac sodium  2 g Topical QID  . insulin aspart  0-5 Units Subcutaneous QHS  . insulin aspart  0-9 Units  Subcutaneous TID WC  . insulin aspart protamine- aspart  6 Units Subcutaneous BID WC  . isosorbide mononitrate  20 mg Oral Daily  . levothyroxine  112 mcg Oral QAC breakfast  . pantoprazole  40 mg Oral Daily  . rOPINIRole  0.5 mg Oral QHS  . simvastatin  40 mg Oral QHS  . tamsulosin  0.4 mg Oral Daily  . vitamin B-12  5,000 mcg Oral Daily  . warfarin  2 mg Oral q1800  . Warfarin - Physician Dosing Inpatient   Does not apply q1800   HYDROcodone-acetaminophen, ondansetron **OR** ondansetron (ZOFRAN) IV, senna-docusate  Assessment/ Plan:  77 y.o. Caucasian female with atrial fibrillation, chronic kidney disease, diabetes, coronary disease, CABG, hypothyroidism, peripheral neuropathy, history of renal artery stenosis, vitiligo  1.  Acute renal failure on chronic kidney disease stage III.  Baseline creatinine 1.52, GFR 31 from April 17, 2018 2.  Urinary tract infection, Klebsiella 3.  Thrombocytopenia  Plan:  Acute renal failure is likely secondary to sepsis.  Other differential includes interstitial nephritis from multiple antibiotics.  Electrolytes and volume status are acceptable at present.  No acute indicated for dialysis.  Discussed with son that if renal function does not recover, she may need short-term dialysis.   LOS: 2 Haley Hill Thedore Mins 9/23/201912:12 PM  [facility] [place], [place] [phone]  Note: This note was prepared with Dragon dictation. Any transcription errors are unintentional

## 2018-05-16 NOTE — Progress Notes (Addendum)
[facility] - [place] [facility]   PATIENT NAME: Haley Hill    MR#:  [id number]  DATE OF BIRTH:  [birth year]  SUBJECTIVE:  CHIEF COMPLAINT:   Chief Complaint  Patient presents with  . Shoulder Pain  . Fall   Recurrent UTIs, recently treated for that and sent to rehab and went home last week.  Came with persistent weakness and confusion noted to have UTI again.  Also noted to have some urinary retention and postvoid volume of 400 mL.  Patient is more awake today. Very weak.  REVIEW OF SYSTEMS:  CONSTITUTIONAL: No fever,have fatigue or weakness.  EYES: No blurred or double vision.  EARS, NOSE, AND THROAT: No tinnitus or ear pain.  RESPIRATORY: No cough, shortness of breath, wheezing or hemoptysis.  CARDIOVASCULAR: No chest pain, orthopnea, edema.  GASTROINTESTINAL: No nausea, vomiting, diarrhea or abdominal pain.  GENITOURINARY: No dysuria, hematuria.  ENDOCRINE: No polyuria, nocturia,  HEMATOLOGY: No anemia, easy bruising or bleeding SKIN: No rash or lesion. MUSCULOSKELETAL: No joint pain or arthritis.   NEUROLOGIC: No tingling, numbness, generalized weakness.  PSYCHIATRY: No anxiety or depression.   ROS  DRUG ALLERGIES:   Allergies  Allergen Reactions  . Ciprofloxacin Itching and Rash  . Fosamax [Alendronate Sodium] Other (See Comments)    Reaction: Ulcers in mouth and esophagus  . Iron Other (See Comments)    VITALS:  Blood pressure 140/85, pulse 88, temperature 100 F (37.8 C), temperature source Oral, resp. rate (!) 27, height 5\' 3"  (1.6 m), weight 94.8 kg, SpO2 99 %.  PHYSICAL EXAMINATION:  GENERAL:  77 y.o.-year-old patient lying in the bed with no acute distress.  EYES: Pupils equal, round, reactive to light and accommodation. No scleral icterus. Extraocular muscles intact.  HEENT: Head atraumatic, normocephalic. Oropharynx and nasopharynx clear.  NECK:  Supple, no jugular venous distention. No thyroid enlargement, no tenderness.  LUNGS:  Normal breath sounds bilaterally, no wheezing, rales,rhonchi or crepitation. No use of accessory muscles of respiration.  CARDIOVASCULAR: S1, S2 normal. No murmurs, rubs, or gallops.  ABDOMEN: Soft, nontender, nondistended. Bowel sounds present. No organomegaly or mass.  EXTREMITIES: No pedal edema, cyanosis, or clubbing.  NEUROLOGIC: Cranial nerves II through XII are intact. Muscle strength 3/5 in all extremities. Sensation intact. Gait not checked. Appears very weak, frail. PSYCHIATRIC: The patient is alert and oriented x 2.  SKIN: No obvious rash, lesion, or ulcer.   Physical Exam LABORATORY PANEL:   CBC Recent Labs  Lab 05/16/18 0633  WBC 10.0  HGB 11.1*  HCT 32.0*  PLT 66*   ------------------------------------------------------------------------------------------------------------------  Chemistries  Recent Labs  Lab 05/16/18 0630 05/16/18 0633  NA  --  136  K  --  3.9  CL  --  102  CO2  --  25  GLUCOSE  --  112*  BUN  --  61*  CREATININE  --  4.53*  CALCIUM  --  7.7*  AST 32  --   ALT 20  --   ALKPHOS 88  --   BILITOT 2.5*  --    ------------------------------------------------------------------------------------------------------------------  Cardiac Enzymes No results for input(s): TROPONINI in the last 168 hours. ------------------------------------------------------------------------------------------------------------------  RADIOLOGY:  Dg Chest 1 View  Result Date: 05/15/2018 CLINICAL DATA:  Hypoxia. EXAM: CHEST  1 VIEW COMPARISON:  05/14/2018 FINDINGS: 1546 hours. Low lung volumes. The cardio pericardial silhouette is enlarged. Interstitial markings are diffusely coarsened with chronic features. No airspace edema. There is some atelectasis at the left base with potential tiny  left effusion. The visualized bony structures of the thorax are intact. Status post median sternotomy. IMPRESSION: Low lung volumes with cardiomegaly and chronic interstitial  coarsening. Left base atelectasis with probable tiny left pleural effusion. Electronically Signed   By: Kennith Center M.D.   On: 05/15/2018 16:29   US Renal  Result Date: 05/15/2018 CLINICAL DATA:  Acute renal failure EXAM: RENAL / URINARY TRACT ULTRASOUND COMPLETE COMPARISON:  04/12/2018 FINDINGS: Right Kidney: Length: 12.5 cm. Mild cortical thinning. Normal echotexture. No mass or hydronephrosis. Left Kidney: Length: 12.1 cm. Cortical thinning. No hydronephrosis. Large cyst replaces much of the mid and lower pole of the left kidney measuring up to 12 cm. Bladder: Appears normal for degree of bladder distention. IMPRESSION: No acute findings.  No hydronephrosis. Large left mid and lower pole renal cyst, 12 cm. Electronically Signed   By: Charlett Nose M.D.   On: 05/15/2018 16:35   US Abdomen Limited Ruq  Result Date: 05/15/2018 CLINICAL DATA:  Elevated bilirubin EXAM: ULTRASOUND ABDOMEN LIMITED RIGHT UPPER QUADRANT COMPARISON:  04/16/2018 FINDINGS: Gallbladder: Layering gallstones with gallbladder wall thickness measuring up to 5-6 mm. No substantial pericholecystic fluid. Sonographer reports no sonographic Murphy sign. Common bile duct: Diameter: 7 mm, upper normal for patient age. Liver: Heterogeneous liver echotexture with increased echogenicity and decreased acoustic through transmission. Nodular contour again noted. Note: Small volume ascites evident. Portal vein is patent on color Doppler imaging with normal direction of blood flow towards the liver. IMPRESSION: 1. Cholelithiasis with mild gallbladder wall thickening. Correlation for acute cholecystitis suggested. 2. 7 mm common bile duct diameter, upper normal for patient age. Common bile duct measured at 4 mm diameter on the study from 1 month ago. Given interval increase and elevated bilirubin, MRCP may prove helpful. 3. Echogenic liver with nodular contour suggests cirrhosis. Electronically Signed   By: Kennith Center M.D.   On: 05/15/2018 16:34     ASSESSMENT AND PLAN:   Active Problems:   AKI (acute kidney injury) (HCC)   * Sepsis-  with leukocytosis and tachycardia at the time of admission Implement sepsis protocol with IV fluids, antibiotics Follow lactic acid and procalcitonin level IV antibiotics cefepime IV fluids Patient is delirious from sepsis continue close monitoring Sent blood culture which was not sent on admission.   Blood cx negatvie, ur cx have klebsiella .  * Acute cystitis-with history of recurrent urinary tract infections Patient also had E. coli bacteremia last time, noted to have 400 mL of post void bladder volume, I would place a Foley catheter and would suggest to go to urologist after infection clears up. Renal US is normal except for a cyst.  * AKI on CKD -3  Hold allopurinol IVF  Avoid nehrotoxins   Pt was also on bactrim, this could have added the insult along with sepsis and Baseline CKD.  Nephro consult. Worsening renal func.  * ch atrial fibrillation with RVR  Rate improved with IV fluids  on Coumadin-therapeutic INR  * Chronic diastolic congestive heart failure Not fluid overloaded at this time.  Lasix on hold in view of acute kidney injury Previous ejection fraction from 03/2015 and 50 to 55% Giving gentle IV fluids, due to renal failure, monitor. * Coronary artery disease status post CABG  * Insulin-dependent diabetes mellitus ISS 70/30 dose reduced to 6 units bid with meals  * Hypothyroidism Continue Synthyroid  * Diabetic neuropathy-continue gabapentin  *Acute respiratory failure with hypoxia Chest Xray with chronic changes. Monitor.  All the records are reviewed  and case discussed with Care Management/Social Workerr. Management plans discussed with the patient, family and they are in agreement.  CODE STATUS: Full.  TOTAL TIME TAKING CARE OF THIS PATIENT: 45 minutes.   I have discussed with son about worsening renal function and if it is not getting better  in 1 to 2 days then prognosis is extremely poor.  Call palliative care consult also.  POSSIBLE D/C IN 2-3 DAYS, DEPENDING ON CLINICAL CONDITION.   Altamese Dilling M.D on 05/16/2018   Between 7am to 6pm - Pager - [phone]  After 6pm go to [email or web address] - password [email or web address]  [facility]  Office  [phone]  CC: Primary care physician; Barbette Reichmann, MD  Note: This dictation was prepared with Dragon dictation along with smaller phrase technology. Any transcriptional errors that result from this process are unintentional.

## 2018-05-16 NOTE — Progress Notes (Signed)
Family Meeting Note  Advance Directive:yes  Today a meeting took place with the son.  Patient is unable to participate due RU:[id number]:Lacked capacity dementia   The following clinical team members were present during this meeting:MD  The following were discussed:Patient's diagnosis: CKD, worsening now, UTI- recurrent, Functional decline, Patient's progosis: Unable to determine and Goals for treatment: Full Code  Additional follow-up to be provided: Nephrology, Palliative care  Time spent during discussion:20 minutes  Haley DillingVaibhavkumar Anna Beaird, MD

## 2018-05-17 ENCOUNTER — Inpatient Hospital Stay: Payer: Medicare Other

## 2018-05-17 DIAGNOSIS — R404 Transient alteration of awareness: Secondary | ICD-10-CM

## 2018-05-17 DIAGNOSIS — N179 Acute kidney failure, unspecified: Secondary | ICD-10-CM

## 2018-05-17 DIAGNOSIS — Z7189 Other specified counseling: Secondary | ICD-10-CM

## 2018-05-17 DIAGNOSIS — Z515 Encounter for palliative care: Secondary | ICD-10-CM

## 2018-05-17 LAB — BASIC METABOLIC PANEL
Anion gap: 11 (ref 5–15)
BUN: 61 mg/dL — ABNORMAL HIGH (ref 8–23)
CHLORIDE: 102 mmol/L (ref 98–111)
CO2: 22 mmol/L (ref 22–32)
Calcium: 7.7 mg/dL — ABNORMAL LOW (ref 8.9–10.3)
Creatinine, Ser: 4.28 mg/dL — ABNORMAL HIGH (ref 0.44–1.00)
GFR calc Af Amer: 10 mL/min — ABNORMAL LOW (ref >60)
GFR, EST NON AFRICAN AMERICAN: 9 mL/min — AB (ref >60)
GLUCOSE: 111 mg/dL — AB (ref 70–99)
POTASSIUM: 4 mmol/L (ref 3.5–5.1)
Sodium: 135 mmol/L (ref 135–145)

## 2018-05-17 LAB — CBC
HEMATOCRIT: 31.3 % — AB (ref 35.0–47.0)
Hemoglobin: 10.8 g/dL — ABNORMAL LOW (ref 12.0–16.0)
MCH: 36.4 pg — ABNORMAL HIGH (ref 26.0–34.0)
MCHC: 34.5 g/dL (ref 32.0–36.0)
MCV: 105.5 fL — ABNORMAL HIGH (ref 80.0–100.0)
Platelets: 59 10*3/uL — ABNORMAL LOW (ref 150–440)
RBC: 2.96 MIL/uL — ABNORMAL LOW (ref 3.80–5.20)
RDW: 14.9 % — ABNORMAL HIGH (ref 11.5–14.5)
WBC: 8.9 10*3/uL (ref 3.6–11.0)

## 2018-05-17 LAB — GLUCOSE, CAPILLARY
GLUCOSE-CAPILLARY: 147 mg/dL — AB (ref 70–99)
GLUCOSE-CAPILLARY: 142 mg/dL — AB (ref 70–99)
Glucose-Capillary: 119 mg/dL — ABNORMAL HIGH (ref 70–99)
Glucose-Capillary: 134 mg/dL — ABNORMAL HIGH (ref 70–99)

## 2018-05-17 LAB — TSH: TSH: 5.325 u[IU]/mL — ABNORMAL HIGH (ref 0.350–4.500)

## 2018-05-17 MED ORDER — POLYETHYLENE GLYCOL 3350 17 G PO PACK
17.0000 g | PACK | Freq: Every day | ORAL | Status: DC
  Administered 2018-05-17 – 2018-05-18 (×2): 17 g via ORAL
  Filled 2018-05-17 (×2): qty 1

## 2018-05-17 MED ORDER — BISACODYL 10 MG RE SUPP
10.0000 mg | Freq: Once | RECTAL | Status: AC
  Administered 2018-05-17: 10 mg via RECTAL
  Filled 2018-05-17: qty 1

## 2018-05-17 NOTE — Consult Note (Signed)
Consultation Note Date: 05/17/2018   Patient Name: Haley Hill  DOB: April 24, 1941  MRN: [id number]  Age / Sex: 77 y.o., female  PCP: Barbette Reichmann, MD Referring Physician: Altamese Dilling, *  Reason for Consultation: Establishing goals of care and Psychosocial/spiritual support  HPI/Patient Profile: 77 y.o. female   admitted on 05/14/2018 with past medical history significant for chronic atrial for ablation on Coumadin, chronic diastolic congestive heart failure, chronic kidney disease/ Creatine 4.28 today, coronary artery disease, insulin requiring diabetes mellitus, gout and multiple other medical problems is brought into the emergency department by her son after she sustained a fall.   She has had multiple re hospitalizations in the past several months, per family she has had continued physical, functional and cognitive decline.  Currently patient is intermittently confused, poor po intake, dependant for all ADLs, over all failure to thrive, with decline in renal function.  Family face treatment option decisions advanced directive decisions and anticipatory care needs.   Clinical Assessment and Goals of Care:   This NP Lorinda Creed reviewed medical records, received report from team, assessed the patient and then meet at the patient's bedside along with her son/Ray and DIL/Kelly  to discuss diagnosis, prognosis, GOC, EOL wishes disposition and options.  Concept of Hospice and Palliative Care were discussed  A detailed discussion was had today regarding advanced directives.  Concepts specific to code status, artifical feeding and hydration, continued IV antibiotics and rehospitalization was had.  The difference between an aggressive medical intervention path  and a palliative comfort care path for this patient at this time was had.  Values and goals of care important to patient and family were  attempted to be elicited.  MOST form introduced  Natural trajectory and expectations at EOL were discussed.  Questions and concerns addressed.   Family encouraged to call with questions or concerns.    PMT will continue to support holistically.   HCPOA    SUMMARY OF RECOMMENDATIONS    Code Status/Advance Care Planning:  Limited code-documented today no CPR or intubation   Symptom Management:   Weakness: PT evaluation and treatment  Palliative Prophylaxis:   Aspiration, Bowel Regimen, Delirium Protocol, Frequent Pain Assessment and Oral Care  Additional Recommendations (Limitations, Scope, Preferences):  No Hemodialysis   Continue current medical interventions over the next few days, family is hopeful for improvement. If no Impromen will shift to comfort  Clarification of GOCs dependant on outcomes.  Psycho-social/Spiritual:   Desire for further Chaplaincy support:yes  Additional Recommendations: Education on Hospice  Prognosis:   < 6 months-dependant on desire for life prolonging measures  Discharge Planning: To Be Determined      Primary Diagnoses: Present on Admission: . AKI (acute kidney injury) (HCC)   I have reviewed the medical record, interviewed the patient and family, and examined the patient. The following aspects are pertinent.  Past Medical History:  Diagnosis Date  . Atrial fibrillation (HCC)   . Atrial flutter (HCC)   . CHF (congestive heart failure) (HCC)   .  CKD (chronic kidney disease) stage 3, GFR 30-59 ml/min (HCC)   . Coronary disease   . Dehydration   . Diabetes (HCC)   . Fracture of humeral shaft, right, closed   . Gout   . Hypertension   . Hypothyroidism   . Peripheral neuropathy   . Renal insufficiency   . Right renal artery stenosis (HCC)   . Syncope and collapse   . Vitiligo    Social History   Socioeconomic History  . Marital status: Divorced    Spouse name: Not on file  . Number of children: Not on file  .  Years of education: Not on file  . Highest education level: Not on file  Occupational History  . Occupation: retired  Engineer, production  . Financial resource strain: Not on file  . Food insecurity:    Worry: Not on file    Inability: Not on file  . Transportation needs:    Medical: Not on file    Non-medical: Not on file  Tobacco Use  . Smoking status: Never Smoker  . Smokeless tobacco: Never Used  Substance and Sexual Activity  . Alcohol use: No  . Drug use: No  . Sexual activity: Not on file  Lifestyle  . Physical activity:    Days per week: Not on file    Minutes per session: Not on file  . Stress: Not on file  Relationships  . Social connections:    Talks on phone: Not on file    Gets together: Not on file    Attends religious service: Not on file    Active member of club or organization: Not on file    Attends meetings of clubs or organizations: Not on file    Relationship status: Not on file  Other Topics Concern  . Not on file  Social History Narrative  . Not on file   Family History  Problem Relation Age of Onset  . Gout Other   . Stroke Other   . Arthritis Other   . Osteoporosis Other    Scheduled Meds: . calcitRIOL  0.5 mcg Oral Daily  . cholecalciferol  2,000 Units Oral Daily  . diclofenac sodium  2 g Topical QID  . insulin aspart  0-5 Units Subcutaneous QHS  . insulin aspart  0-9 Units Subcutaneous TID WC  . insulin aspart protamine- aspart  6 Units Subcutaneous BID WC  . isosorbide mononitrate  20 mg Oral Daily  . levothyroxine  112 mcg Oral QAC breakfast  . pantoprazole  40 mg Oral Daily  . polyethylene glycol  17 g Oral Daily  . rOPINIRole  0.5 mg Oral QHS  . simvastatin  40 mg Oral QHS  . tamsulosin  0.4 mg Oral Daily  . vitamin B-12  5,000 mcg Oral Daily  . warfarin  2 mg Oral q1800  . Warfarin - Physician Dosing Inpatient   Does not apply q1800   Continuous Infusions: . sodium chloride 75 mL/hr at 05/17/18 1329  . cefTRIAXone (ROCEPHIN)  IV  Stopped (05/17/18 0924)   PRN Meds:.acetaminophen, HYDROcodone-acetaminophen, ondansetron **OR** ondansetron (ZOFRAN) IV, senna-docusate Medications Prior to Admission:  Prior to Admission medications   Medication Sig Start Date End Date Taking? Authorizing Provider  allopurinol (ZYLOPRIM) 100 MG tablet Take 1 tablet (100 mg total) by mouth daily. 05/03/18  Yes Medina-Vargas, Monina C, NP  calcitRIOL (ROCALTROL) 0.5 MCG capsule Take 1 capsule (0.5 mcg total) by mouth daily. 05/03/18  Yes Medina-Vargas, Monina C, NP  cefUROXime (  CEFTIN) 250 MG tablet Take 1 tablet by mouth 2 (two) times daily. 05/12/18  Yes [provider]  Cholecalciferol (VITAMIN D) 2000 UNITS CAPS Take 2,000 Units by mouth daily.   Yes [provider]  Cyanocobalamin 5000 MCG LOZG Take 5,000 mcg by mouth daily.    Yes [provider]  diclofenac sodium (VOLTAREN) 1 % GEL Apply 2 g topically 4 (four) times daily. Apply to left foot 05/03/18  Yes Medina-Vargas, Monina C, NP  furosemide (LASIX) 20 MG tablet Take 1 tablet (20 mg total) by mouth 2 (two) times daily. 05/03/18  Yes Medina-Vargas, Monina C, NP  gabapentin (NEURONTIN) 800 MG tablet Take 1 tablet (800 mg total) by mouth every 8 (eight) hours. 05/03/18  Yes Medina-Vargas, Monina C, NP  glipiZIDE (GLUCOTROL) 10 MG tablet Take 1 tablet (10 mg total) by mouth daily before breakfast. 05/03/18  Yes Medina-Vargas, Monina C, NP  insulin NPH-regular Human (NOVOLIN 70/30) (70-30) 100 UNIT/ML injection Inject 18 Units into the skin 2 (two) times daily with a meal. 05/03/18  Yes Medina-Vargas, Monina C, NP  isosorbide mononitrate (ISMO,MONOKET) 20 MG tablet Take 1 tablet (20 mg total) by mouth daily. 05/03/18  Yes Medina-Vargas, Monina C, NP  levothyroxine (SYNTHROID, LEVOTHROID) 112 MCG tablet Take 1 tablet (112 mcg total) by mouth daily before breakfast. 05/03/18  Yes Medina-Vargas, Monina C, NP  omeprazole (PRILOSEC) 20 MG capsule Take 1 capsule (20 mg total) by  mouth 2 (two) times daily. 05/03/18  Yes Medina-Vargas, Monina C, NP  potassium chloride SA (K-DUR,KLOR-CON) 20 MEQ tablet Take 1 tablet (20 mEq total) by mouth daily. 05/03/18  Yes Medina-Vargas, Monina C, NP  rOPINIRole (REQUIP) 0.5 MG tablet Take 1 tablet (0.5 mg total) by mouth at bedtime. 05/03/18  Yes Medina-Vargas, Monina C, NP  simvastatin (ZOCOR) 40 MG tablet Take 1 tablet (40 mg total) by mouth at bedtime. 05/03/18  Yes Medina-Vargas, Monina C, NP  trimethoprim (TRIMPEX) 100 MG tablet Take 1 tablet (100 mg total) by mouth daily. 05/03/18  Yes Medina-Vargas, Monina C, NP  warfarin (COUMADIN) 2 MG tablet Take 1 tablet (2 mg total) by mouth See admin instructions. Once A Day on Wed, Fri Patient taking differently: Take 2 mg by mouth daily.  05/03/18  Yes Medina-Vargas, Monina C, NP  acetaminophen (TYLENOL) 500 MG tablet Take 500 mg by mouth 2 (two) times daily as needed. for pain/ increased temp. May be administered orally, per G-tube if needed or rectally if unable to swallow (separate order). Maximum dose for 24 hours is 3,000 mg from all sources of Acetaminophen/ Tylenol     [provider]  HYDROcodone-acetaminophen (NORCO/VICODIN) 5-325 MG tablet Take 1 tablet by mouth every 6 (six) hours as needed for moderate pain or severe pain. Patient not taking: Reported on 05/14/2018 04/18/18   Enid BaasKalisetti, Radhika, MD  ONE TOUCH ULTRA TEST test strip 1 each by Other route 4 (four) times daily. 03/08/18   Medina-Vargas, Monina C, NP  senna-docusate (SENOKOT-S) 8.6-50 MG per tablet Take 1 tablet by mouth at bedtime as needed for mild constipation. 05/02/15   Barbette ReichmannHande, Vishwanath, MD  warfarin (COUMADIN) 1 MG tablet Take 1 tablet (1 mg total) by mouth See admin instructions. once a day on Sun, Mon, Tue, Thu, Sat Patient not taking: Reported on 05/14/2018 05/03/18   Medina-Vargas, Margit BandaMonina C, NP   Allergies  Allergen Reactions  . Ciprofloxacin Itching and Rash  . Fosamax [Alendronate Sodium] Other (See  Comments)    Reaction: Ulcers in mouth  and esophagus  . Iron Other (See Comments)   Review of Systems  Unable to perform ROS: Mental status change    Physical Exam  Constitutional: She appears well-developed.  intermittently confused  Cardiovascular: Normal rate, regular rhythm and normal heart sounds.  Pulmonary/Chest: Effort normal and breath sounds normal.  Abdominal: She exhibits distension.  Neurological: She is alert.  Skin: Skin is warm and dry.    Vital Signs: BP 115/74 (BP Location: Left Arm)   Pulse 75   Temp 99.3 F (37.4 C) (Oral)   Resp 18   Ht 5\' 3"  (1.6 m)   Wt 94.8 kg   SpO2 99%   BMI 37.00 kg/m  Pain Scale: Faces POSS *See Group Information*: 1-Acceptable,Awake and alert Pain Score: Asleep   SpO2: SpO2: 99 % O2 Device:SpO2: 99 % O2 Flow Rate: .O2 Flow Rate (L/min): 2 L/min  IO: Intake/output summary:   Intake/Output Summary (Last 24 hours) at 05/17/2018 1512 Last data filed at 05/17/2018 1329 Gross per 24 hour  Intake 2453.51 ml  Output 850 ml  Net 1603.51 ml    LBM: Last BM Date: 05/17/18(small amount) Baseline Weight: Weight: 94.8 kg Most recent weight: Weight: 94.8 kg     Palliative Assessment/Data: 30 % at best   Discussed with Dr Elisabeth Pigeon  F/U in the morning with family for further deliniation of GOCs, leaning toward a full comfort path  Time In: 1445 Time Out: 1600 Time Total: 75 minutes Greater than 50%  of this time was spent counseling and coordinating care related to the above assessment and plan.  Signed by: Lorinda Creed, NP   Please contact Palliative Medicine Team phone at [phone] for questions and concerns.  For individual provider: See Loretha Stapler

## 2018-05-17 NOTE — Progress Notes (Signed)
[facility] [place], [place] 05/17/18  Subjective:   Patient remains critically ill. Her son is at bedside Denies any acute c/o. More alert compared to yesterday Appetite is poor S Creatinine marginally improved   Objective:  Vital signs in last 24 hours:  Temp:  [99 F (37.2 C)-100.7 F (38.2 C)] 99 F (37.2 C) (09/24 0509) Pulse Rate:  [79-86] 86 (09/24 0846) Resp:  [17-30] 30 (09/24 0509) BP: (105-122)/(69-90) 122/90 (09/24 0846) SpO2:  [95 %-99 %] 95 % (09/24 0509)  Weight change:  Filed Weights   05/14/18 0838  Weight: 94.8 kg    Intake/Output:    Intake/Output Summary (Last 24 hours) at 05/17/2018 1336 Last data filed at 05/17/2018 1329 Gross per 24 hour  Intake 2453.51 ml  Output 850 ml  Net 1603.51 ml     Physical Exam: General:  Laying in the bed, chronically ill-appearing  HEENT  bilateral malar discoloration, anicteric,   Neck  supple, no JVD  Pulm/lungs  decreased breath sounds at bases, nasal cannula oxygen  CVS/Heart  no rub or gallop  Abdomen:   Soft, mildly distended, mild diffuse tenderness  Extremities:  Congestive discoloration over legs, 1+ edema  Neurologic:  Alert, able to answer few questions,  Skin:  Normal turgor    Foley catheter in place       Basic Metabolic Panel:  Recent Labs  Lab 05/14/18 0841 05/15/18 0604 05/16/18 0633 05/17/18 0446  NA 137 139 136 135  K 3.9 4.2 3.9 4.0  CL 93* 97* 102 102  CO2 28 27 25 22   GLUCOSE 289* 182* 112* 111*  BUN 43* 48* 61* 61*  CREATININE 2.79* 3.20* 4.53* 4.28*  CALCIUM 9.2 8.8* 7.7* 7.7*     CBC: Recent Labs  Lab 05/14/18 0841 05/15/18 0604 05/16/18 0633 05/17/18 0446  WBC 17.6* 11.5* 10.0 8.9  NEUTROABS 16.0*  --   --   --   HGB 13.0 13.2 11.1* 10.8*  HCT 38.0 38.9 32.0* 31.3*  MCV 104.5* 105.9* 105.6* 105.5*  PLT 76* 76* 66* 59*     No results found for: HEPBSAG, HEPBSAB, HEPBIGM    Microbiology:  Recent Results (from the past 240 hour(s))   Urine Culture     Status: Abnormal   Collection Time: 05/14/18  9:23 AM  Result Value Ref Range Status   Specimen Description   Final    URINE, RANDOM Performed at [facility], [address] [place], [place] [address]    Special Requests   Final    NONE Performed at [facility], [address] [place], [place] [address]    Culture 60,000 COLONIES/mL KLEBSIELLA PNEUMONIAE (A)  Final   Report Status 05/16/2018 FINAL  Final   Organism ID, Bacteria KLEBSIELLA PNEUMONIAE (A)  Final      Susceptibility   Klebsiella pneumoniae - MIC*    AMPICILLIN >=32 RESISTANT Resistant     CEFAZOLIN 8 SENSITIVE Sensitive     CEFTRIAXONE <=1 SENSITIVE Sensitive     CIPROFLOXACIN <=0.25 SENSITIVE Sensitive     GENTAMICIN <=1 SENSITIVE Sensitive     IMIPENEM <=0.25 SENSITIVE Sensitive     NITROFURANTOIN 128 RESISTANT Resistant     TRIMETH/SULFA >=320 RESISTANT Resistant     AMPICILLIN/SULBACTAM >=32 RESISTANT Resistant     PIP/TAZO >=128 RESISTANT Resistant     Extended ESBL NEGATIVE Sensitive     * 60,000 COLONIES/mL KLEBSIELLA PNEUMONIAE  CULTURE, BLOOD (ROUTINE X 2) w Reflex to ID Panel  Status: None (Preliminary result)   Collection Time: 05/15/18  2:18 PM  Result Value Ref Range Status   Specimen Description BLOOD LT HAND  Final   Special Requests   Final    BOTTLES DRAWN AEROBIC AND ANAEROBIC Blood Culture results may not be optimal due to an excessive volume of blood received in culture bottles   Culture   Final    NO GROWTH 2 DAYS Performed at [facility], [address], [place], [place] [address]    Report Status PENDING  Incomplete  CULTURE, BLOOD (ROUTINE X 2) w Reflex to ID Panel     Status: None (Preliminary result)   Collection Time: 05/15/18  2:26 PM  Result Value Ref Range Status   Specimen Description BLOOD RT ARM  Final   Special Requests   Final    BOTTLES DRAWN AEROBIC AND ANAEROBIC Blood Culture results may not be optimal due to an  excessive volume of blood received in culture bottles   Culture   Final    NO GROWTH 2 DAYS Performed at [facility], [address] [place], [place] [address]    Report Status PENDING  Incomplete    Coagulation Studies: Recent Labs    05/15/18 0604 05/16/18 0633  LABPROT 23.3* 24.8*  INR 2.09 2.26    Urinalysis: No results for input(s): COLORURINE, LABSPEC, PHURINE, GLUCOSEU, HGBUR, BILIRUBINUR, KETONESUR, PROTEINUR, UROBILINOGEN, NITRITE, LEUKOCYTESUR in the last 72 hours.  Invalid input(s): APPERANCEUR    Imaging: Dg Chest 1 View  Result Date: 05/15/2018 CLINICAL DATA:  Hypoxia. EXAM: CHEST  1 VIEW COMPARISON:  05/14/2018 FINDINGS: 1546 hours. Low lung volumes. The cardio pericardial silhouette is enlarged. Interstitial markings are diffusely coarsened with chronic features. No airspace edema. There is some atelectasis at the left base with potential tiny left effusion. The visualized bony structures of the thorax are intact. Status post median sternotomy. IMPRESSION: Low lung volumes with cardiomegaly and chronic interstitial coarsening. Left base atelectasis with probable tiny left pleural effusion. Electronically Signed   By: Kennith Center M.D.   On: 05/15/2018 16:29   US Renal  Result Date: 05/15/2018 CLINICAL DATA:  Acute renal failure EXAM: RENAL / URINARY TRACT ULTRASOUND COMPLETE COMPARISON:  04/12/2018 FINDINGS: Right Kidney: Length: 12.5 cm. Mild cortical thinning. Normal echotexture. No mass or hydronephrosis. Left Kidney: Length: 12.1 cm. Cortical thinning. No hydronephrosis. Large cyst replaces much of the mid and lower pole of the left kidney measuring up to 12 cm. Bladder: Appears normal for degree of bladder distention. IMPRESSION: No acute findings.  No hydronephrosis. Large left mid and lower pole renal cyst, 12 cm. Electronically Signed   By: Charlett Nose M.D.   On: 05/15/2018 16:35   US Abdomen Limited Ruq  Result Date: 05/15/2018 CLINICAL DATA:   Elevated bilirubin EXAM: ULTRASOUND ABDOMEN LIMITED RIGHT UPPER QUADRANT COMPARISON:  04/16/2018 FINDINGS: Gallbladder: Layering gallstones with gallbladder wall thickness measuring up to 5-6 mm. No substantial pericholecystic fluid. Sonographer reports no sonographic Murphy sign. Common bile duct: Diameter: 7 mm, upper normal for patient age. Liver: Heterogeneous liver echotexture with increased echogenicity and decreased acoustic through transmission. Nodular contour again noted. Note: Small volume ascites evident. Portal vein is patent on color Doppler imaging with normal direction of blood flow towards the liver. IMPRESSION: 1. Cholelithiasis with mild gallbladder wall thickening. Correlation for acute cholecystitis suggested. 2. 7 mm common bile duct diameter, upper normal for patient age. Common bile duct measured at 4 mm diameter on the study from 1  month ago. Given interval increase and elevated bilirubin, MRCP may prove helpful. 3. Echogenic liver with nodular contour suggests cirrhosis. Electronically Signed   By: Kennith CenterEric  Mansell M.D.   On: 05/15/2018 16:34     Medications:   . sodium chloride 75 mL/hr at 05/17/18 1329  . cefTRIAXone (ROCEPHIN)  IV Stopped (05/17/18 [id number])   . calcitRIOL  0.5 mcg Oral Daily  . cholecalciferol  2,000 Units Oral Daily  . diclofenac sodium  2 g Topical QID  . insulin aspart  0-5 Units Subcutaneous QHS  . insulin aspart  0-9 Units Subcutaneous TID WC  . insulin aspart protamine- aspart  6 Units Subcutaneous BID WC  . isosorbide mononitrate  20 mg Oral Daily  . levothyroxine  112 mcg Oral QAC breakfast  . pantoprazole  40 mg Oral Daily  . rOPINIRole  0.5 mg Oral QHS  . simvastatin  40 mg Oral QHS  . tamsulosin  0.4 mg Oral Daily  . vitamin B-12  5,000 mcg Oral Daily  . warfarin  2 mg Oral q1800  . Warfarin - Physician Dosing Inpatient   Does not apply q1800   acetaminophen, HYDROcodone-acetaminophen, ondansetron **OR** ondansetron (ZOFRAN) IV,  senna-docusate  Assessment/ Plan:  77 y.o. Caucasian female with atrial fibrillation, chronic kidney disease, diabetes, coronary disease, CABG, hypothyroidism, peripheral neuropathy, history of renal artery stenosis, vitiligo  1.  Acute renal failure on chronic kidney disease stage III.  Baseline creatinine 1.52, GFR 31 from April 17, 2018 2.  Urinary tract infection, Klebsiella 3.  Thrombocytopenia  Plan:  Acute renal failure is likely secondary to sepsis.  Other differential includes interstitial nephritis from multiple antibiotics.  Electrolytes and volume status are acceptable at present.  No acute indicated for dialysis.  Discussed with son that if renal function does not recover, she may need short-term dialysis. Miralax for constipation. Trial of Nepro   LOS: 3 Nivia Gervase Thedore MinsSingh 9/24/20191:36 PM  [facility] [place], [place] [phone]  Note: This note was prepared with Dragon dictation. Any transcription errors are unintentional

## 2018-05-17 NOTE — Plan of Care (Signed)
Resting quietly in bed,denies any pain at present although abdomen is soft but distended.faint,hypoactive BS heard in LLQ only,creatinine 4.28 and BUN 61,Will continue to monitor.Callbell within reach.

## 2018-05-17 NOTE — Care Management Important Message (Addendum)
Initial Medicare IM signed by family member.  Copy left in patient's room.

## 2018-05-17 NOTE — Progress Notes (Signed)
[facility] [place] [facility]   PATIENT NAME: Haley Hill    MR#:  [id number]  DATE OF BIRTH:  06/09/1941  SUBJECTIVE:  CHIEF COMPLAINT:   Chief Complaint  Patient presents with  . Shoulder Pain  . Fall   Recurrent UTIs, recently treated for that and sent to rehab and went home last week.  Came with persistent weakness and confusion noted to have UTI again.  Also noted to have some urinary retention and postvoid volume of 400 mL.  Patient is more awake today. Very weak.  Has abdominal distention and complaining of some discomfort.  REVIEW OF SYSTEMS:  CONSTITUTIONAL: No fever,have fatigue or weakness.  EYES: No blurred or double vision.  EARS, NOSE, AND THROAT: No tinnitus or ear pain.  RESPIRATORY: No cough, shortness of breath, wheezing or hemoptysis.  CARDIOVASCULAR: No chest pain, orthopnea, edema.  GASTROINTESTINAL: No nausea, vomiting, diarrhea or abdominal pain.  GENITOURINARY: No dysuria, hematuria.  ENDOCRINE: No polyuria, nocturia,  HEMATOLOGY: No anemia, easy bruising or bleeding SKIN: No rash or lesion. MUSCULOSKELETAL: No joint pain or arthritis.   NEUROLOGIC: No tingling, numbness, generalized weakness.  PSYCHIATRY: No anxiety or depression.   ROS  DRUG ALLERGIES:   Allergies  Allergen Reactions  . Ciprofloxacin Itching and Rash  . Fosamax [Alendronate Sodium] Other (See Comments)    Reaction: Ulcers in mouth and esophagus  . Iron Other (See Comments)    VITALS:  Blood pressure 115/74, pulse 75, temperature 99.3 F (37.4 C), temperature source Oral, resp. rate 18, height 5\' 3"  (1.6 m), weight 94.8 kg, SpO2 99 %.  PHYSICAL EXAMINATION:  GENERAL:  77 y.o.-year-old patient lying in the bed with no acute distress.  EYES: Pupils equal, round, reactive to light and accommodation. No scleral icterus. Extraocular muscles intact.  HEENT: Head atraumatic, normocephalic. Oropharynx and nasopharynx clear.  NECK:  Supple, no jugular venous  distention. No thyroid enlargement, no tenderness.  LUNGS: Normal breath sounds bilaterally, no wheezing, rales,rhonchi or crepitation. No use of accessory muscles of respiration.  CARDIOVASCULAR: S1, S2 normal. No murmurs, rubs, or gallops.  ABDOMEN: Soft, nontender, distended. Bowel sounds weak. No organomegaly or mass.  EXTREMITIES: No pedal edema, cyanosis, or clubbing.  NEUROLOGIC: Cranial nerves II through XII are intact. Muscle strength 3/5 in all extremities. Sensation intact. Gait not checked. Appears very weak, frail. PSYCHIATRIC: The patient is alert and oriented x 3.  SKIN: No obvious rash, lesion, or ulcer.   Physical Exam LABORATORY PANEL:   CBC Recent Labs  Lab 05/17/18 0446  WBC 8.9  HGB 10.8*  HCT 31.3*  PLT 59*   ------------------------------------------------------------------------------------------------------------------  Chemistries  Recent Labs  Lab 05/16/18 0630  05/17/18 0446  NA  --    < > 135  K  --    < > 4.0  CL  --    < > 102  CO2  --    < > 22  GLUCOSE  --    < > 111*  BUN  --    < > 61*  CREATININE  --    < > 4.28*  CALCIUM  --    < > 7.7*  AST 32  --   --   ALT 20  --   --   ALKPHOS 88  --   --   BILITOT 2.5*  --   --    < > = values in this interval not displayed.   ------------------------------------------------------------------------------------------------------------------  Cardiac Enzymes No results for input(s):  TROPONINI in the last 168 hours. ------------------------------------------------------------------------------------------------------------------  RADIOLOGY:  Dg Chest 1 View  Result Date: 05/15/2018 CLINICAL DATA:  Hypoxia. EXAM: CHEST  1 VIEW COMPARISON:  05/14/2018 FINDINGS: 1546 hours. Low lung volumes. The cardio pericardial silhouette is enlarged. Interstitial markings are diffusely coarsened with chronic features. No airspace edema. There is some atelectasis at the left base with potential tiny left  effusion. The visualized bony structures of the thorax are intact. Status post median sternotomy. IMPRESSION: Low lung volumes with cardiomegaly and chronic interstitial coarsening. Left base atelectasis with probable tiny left pleural effusion. Electronically Signed   By: Kennith CenterEric  Mansell M.D.   On: 05/15/2018 16:29   Dg Abd 1 View  Result Date: 05/17/2018 CLINICAL DATA:  History of ileus, chronic kidney disease, diabetes EXAM: ABDOMEN - 1 VIEW COMPARISON:  Ultrasound the abdomen of 05/15/2018 FINDINGS: Supine views of the abdomen show slight gaseous distention of the stomach. There is some small bowel gas present but no significant distention of either large or small bowel is seen. No significant stool burden is seen. Considerable abdominal aortic atherosclerosis is noted. IMPRESSION: 1. Nonspecific bowel gas pattern. Minimal amount of small bowel gas of questionable significance. 2. Moderate to severe abdominal aortic atherosclerosis. Electronically Signed   By: Dwyane DeePaul  Barry M.D.   On: 05/17/2018 14:51   [place] Renal  Result Date: 05/15/2018 CLINICAL DATA:  Acute renal failure EXAM: RENAL / URINARY TRACT ULTRASOUND COMPLETE COMPARISON:  04/12/2018 FINDINGS: Right Kidney: Length: 12.5 cm. Mild cortical thinning. Normal echotexture. No mass or hydronephrosis. Left Kidney: Length: 12.1 cm. Cortical thinning. No hydronephrosis. Large cyst replaces much of the mid and lower pole of the left kidney measuring up to 12 cm. Bladder: Appears normal for degree of bladder distention. IMPRESSION: No acute findings.  No hydronephrosis. Large left mid and lower pole renal cyst, 12 cm. Electronically Signed   By: Charlett NoseKevin  Dover M.D.   On: 05/15/2018 16:35   [place] Abdomen Limited Ruq  Result Date: 05/15/2018 CLINICAL DATA:  Elevated bilirubin EXAM: ULTRASOUND ABDOMEN LIMITED RIGHT UPPER QUADRANT COMPARISON:  04/16/2018 FINDINGS: Gallbladder: Layering gallstones with gallbladder wall thickness measuring up to 5-6 mm. No substantial  pericholecystic fluid. Sonographer reports no sonographic Murphy sign. Common bile duct: Diameter: 7 mm, upper normal for patient age. Liver: Heterogeneous liver echotexture with increased echogenicity and decreased acoustic through transmission. Nodular contour again noted. Note: Small volume ascites evident. Portal vein is patent on color Doppler imaging with normal direction of blood flow towards the liver. IMPRESSION: 1. Cholelithiasis with mild gallbladder wall thickening. Correlation for acute cholecystitis suggested. 2. 7 mm common bile duct diameter, upper normal for patient age. Common bile duct measured at 4 mm diameter on the study from 1 month ago. Given interval increase and elevated bilirubin, MRCP may prove helpful. 3. Echogenic liver with nodular contour suggests cirrhosis. Electronically Signed   By: Kennith CenterEric  Mansell M.D.   On: 05/15/2018 16:34    ASSESSMENT AND PLAN:   Active Problems:   AKI (acute kidney injury) (HCC)   * Sepsis-  with leukocytosis and tachycardia at the time of admission Implement sepsis protocol with IV fluids, antibiotics Follow lactic acid and procalcitonin level IV antibiotics cefepime IV fluids Patient is delirious from sepsis continue close monitoring Sent blood culture which was not sent on admission.   Blood cx negatvie, ur cx have klebsiella .  * Acute cystitis-with history of recurrent urinary tract infections Patient also had E. coli bacteremia last time, noted to have 400 mL  of post void bladder volume, I would place a Foley catheter and would suggest to go to urologist after infection clears up. Renal US is normal except for a cyst.  * AKI on CKD -3  Hold allopurinol IVF  Avoid nehrotoxins   Pt was also on bactrim, this could have added the insult along with sepsis and Baseline CKD.  Nephro consult. Worsening renal func.  * ch atrial fibrillation with RVR  Rate improved with IV fluids  on Coumadin-therapeutic INR  * Chronic diastolic  congestive heart failure Not fluid overloaded at this time.  Lasix on hold in view of acute kidney injury Previous ejection fraction from 03/2015 and 50 to 55% Giving gentle IV fluids, due to renal failure, monitor.  * Coronary artery disease status post CABG  * Insulin-dependent diabetes mellitus ISS 70/30 dose reduced to 6 units bid with meals  As decreased intake today, check KUB and hold Insuline.  * Hypothyroidism Continue Synthyroid, check TSH.  * Diabetic neuropathy-continue gabapentin  *Acute respiratory failure with hypoxia Chest Xray with chronic changes. Monitor.  All the records are reviewed and case discussed with Care Management/Social Workerr. Management plans discussed with the patient, family and they are in agreement.  CODE STATUS: Full.  TOTAL TIME TAKING CARE OF THIS PATIENT: 45 minutes.   I have discussed with son about worsening renal function and if it is not getting better in 1 to 2 days then prognosis is extremely poor.  Call palliative care consult also.  POSSIBLE D/C IN 2-3 DAYS, DEPENDING ON CLINICAL CONDITION.   Altamese Dilling M.D on 05/17/2018   Between 7am to 6pm - Pager - [phone]  After 6pm go to [email or web address] - password [email or web address]  [facility]  Office  [phone]  CC: Primary care physician; Barbette Reichmann, MD  Note: This dictation was prepared with Dragon dictation along with smaller phrase technology. Any transcriptional errors that result from this process are unintentional.

## 2018-05-17 NOTE — Progress Notes (Signed)
PT Cancellation Note  Patient Details Name: Haley Hill MRN: [id number] DOB: [birth year]   Cancelled Treatment:    Reason Eval/Treat Not Completed: Patient declined, no reason specified.  Order received.  Chart reviewed.  Patient requested that PT return tomorrow for evaluation as pt feels too weak to participate today.  Patient hesitant with speech and appeared very lethargic.  Will re-attempt when patient is more appropriate.   Glenetta HewSarah Sacoya Mcgourty, PT, DPT 05/17/2018, 2:10 PM

## 2018-05-18 DIAGNOSIS — R52 Pain, unspecified: Secondary | ICD-10-CM

## 2018-05-18 LAB — CBC
HCT: 30.9 % — ABNORMAL LOW (ref 35.0–47.0)
Hemoglobin: 10.7 g/dL — ABNORMAL LOW (ref 12.0–16.0)
MCH: 36.1 pg — ABNORMAL HIGH (ref 26.0–34.0)
MCHC: 34.7 g/dL (ref 32.0–36.0)
MCV: 104.2 fL — ABNORMAL HIGH (ref 80.0–100.0)
PLATELETS: 61 10*3/uL — AB (ref 150–440)
RBC: 2.97 MIL/uL — AB (ref 3.80–5.20)
RDW: 14.8 % — ABNORMAL HIGH (ref 11.5–14.5)
WBC: 7.7 10*3/uL (ref 3.6–11.0)

## 2018-05-18 LAB — GLUCOSE, CAPILLARY
Glucose-Capillary: 124 mg/dL — ABNORMAL HIGH (ref 70–99)
Glucose-Capillary: 158 mg/dL — ABNORMAL HIGH (ref 70–99)
Glucose-Capillary: 113 mg/dL — ABNORMAL HIGH (ref 70–99)

## 2018-05-18 LAB — BASIC METABOLIC PANEL
ANION GAP: 9 (ref 5–15)
BUN: 60 mg/dL — AB (ref 8–23)
CO2: 23 mmol/L (ref 22–32)
CREATININE: 4.16 mg/dL — AB (ref 0.44–1.00)
Calcium: 7.6 mg/dL — ABNORMAL LOW (ref 8.9–10.3)
Chloride: 104 mmol/L (ref 98–111)
GFR calc Af Amer: 11 mL/min — ABNORMAL LOW (ref >60)
GFR calc non Af Amer: 9 mL/min — ABNORMAL LOW (ref >60)
GLUCOSE: 139 mg/dL — AB (ref 70–99)
Potassium: 4.1 mmol/L (ref 3.5–5.1)
SODIUM: 136 mmol/L (ref 135–145)

## 2018-05-18 LAB — PROTIME-INR
INR: 3.87
PROTHROMBIN TIME: 37.7 s — AB (ref 11.4–15.2)

## 2018-05-18 MED ORDER — MORPHINE SULFATE (CONCENTRATE) 10 MG/0.5ML PO SOLN
5.0000 mg | ORAL | Status: DC | PRN
  Administered 2018-05-18 – 2018-05-19 (×5): 5 mg via ORAL
  Filled 2018-05-18 (×5): qty 1

## 2018-05-18 MED ORDER — BISACODYL 10 MG RE SUPP: 10.0000 mg | Freq: Every day | RECTAL | Status: DC | PRN

## 2018-05-18 MED ORDER — WARFARIN - PHARMACIST DOSING INPATIENT: Freq: Every day | Status: DC

## 2018-05-18 MED ORDER — LORAZEPAM 0.5 MG PO TABS
0.5000 mg | ORAL_TABLET | ORAL | Status: DC | PRN
  Administered 2018-05-19: 0.5 mg via ORAL
  Filled 2018-05-18: qty 1

## 2018-05-18 NOTE — Progress Notes (Signed)
Chaplain met patient, who was drowsy. No family was present. Chaplain held the patient's hand and offered energetic and discursive prayer. During the prayer, the patient fell asleep.  Chaplain concluded the encounter by speaking with the patient's nurse and offered continuing care. Chaplain sent a chat note to the on-call chaplain notifying them of the situation.

## 2018-05-18 NOTE — Progress Notes (Addendum)
ANTICOAGULATION CONSULT NOTE - Initial Consult  Pharmacy Consult for Warfarin Indication: atrial fibrillation  Allergies  Allergen Reactions  . Ciprofloxacin Itching and Rash  . Fosamax [Alendronate Sodium] Other (See Comments)    Reaction: Ulcers in mouth and esophagus  . Iron Other (See Comments)    Patient Measurements: Height: 5\' 3"  (160 cm) Weight: 208 lb 14.4 oz (94.8 kg) IBW/kg (Calculated) : 52.4 Heparin Dosing Weight:    Vital Signs: Temp: 98.4 F (36.9 C) (09/25 0423) Temp Source: Oral (09/25 0423) BP: 99/69 (09/25 0423) Pulse Rate: 83 (09/25 0423)  Labs: Recent Labs    05/16/18 4403 05/17/18 0446 05/18/18 0553  HGB 11.1* 10.8* 10.7*  HCT 32.0* 31.3* 30.9*  PLT 66* 59* 61*  LABPROT 24.8*  --  37.7*  INR 2.26  --  3.87  CREATININE 4.53* 4.28* 4.16*    Estimated Creatinine Clearance: 12 mL/min (A) (by C-G formula based on SCr of 4.16 mg/dL (H)).   Medical History: Past Medical History:  Diagnosis Date  . Atrial fibrillation (HCC)   . Atrial flutter (HCC)   . CHF (congestive heart failure) (HCC)   . CKD (chronic kidney disease) stage 3, GFR 30-59 ml/min (HCC)   . Coronary disease   . Dehydration   . Diabetes (HCC)   . Fracture of humeral shaft, right, closed   . Gout   . Hypertension   . Hypothyroidism   . Peripheral neuropathy   . Renal insufficiency   . Right renal artery stenosis (HCC)   . Syncope and collapse   . Vitiligo     Medications:  Scheduled:  . calcitRIOL  0.5 mcg Oral Daily  . cholecalciferol  2,000 Units Oral Daily  . diclofenac sodium  2 g Topical QID  . insulin aspart  0-5 Units Subcutaneous QHS  . insulin aspart  0-9 Units Subcutaneous TID WC  . insulin aspart protamine- aspart  6 Units Subcutaneous BID WC  . isosorbide mononitrate  20 mg Oral Daily  . levothyroxine  112 mcg Oral QAC breakfast  . pantoprazole  40 mg Oral Daily  . polyethylene glycol  17 g Oral Daily  . rOPINIRole  0.5 mg Oral QHS  . simvastatin  40  mg Oral QHS  . tamsulosin  0.4 mg Oral Daily  . vitamin B-12  5,000 mcg Oral Daily  . Warfarin - Pharmacist Dosing Inpatient   Does not apply q1800   Infusions:  . sodium chloride 75 mL/hr at 05/17/18 1329  . cefTRIAXone (ROCEPHIN)  IV Stopped (05/17/18 4742)    Assessment: 77 yo F admitted 05/14/18, on Warfarin 2 mg po daily PTA which was continued upon admission. INR therapuetic on admission. MD has been dosing so far.  Per Chart review encounters-patient had dose increased on 05/10/18 to 2 mg daily  9/23 INR 2.26   Warfarin 2 mg 9/24 none         Warfarin 2 mg 9/25 INR 3.87  Goal of Therapy:  INR 2-3 Monitor platelets by anticoagulation protocol: Yes   Plan:  INR supratherapuetic. Will hold todays dose. Hgb 10.7  Plt 61. F/u INR in am. Patient on CTX  Haley Hill A 05/18/2018,8:36 AM

## 2018-05-18 NOTE — Progress Notes (Addendum)
Patient ID: Fredrik RiggerJean S Starke, female   DOB: 02-11-1941, 77 y.o.   MRN: [id number]  This NP visited patient at the bedside as a follow up to  yesterday's GOCs meeting for continued conversation regarding diagnosis, prognosis, goals of care and end-of-life wishes.  Patient continues to be extremely lethargic, tolerating only sips and bites and complaining of generalized discomfort.  Patient's son/Ray and her brother and several other family members are at the bedside.  Family understand the long-term poor prognosis and wished to shift to a full comfort path.  They hope for comfort, quality and dignity at end of life and to access care at a residential hospice.  [facility] will write for choice  With a shift to comfort; no further artificial hydration, DC antibiotics, no further diagnostics, creatinine 4.1/ no dialysis--  prognosis is likely less than 2 weeks.  Symptom management -Dyspnea/pain;  Roxanol 5 mg p.o./sublingual every 2 hours  -Anxiety-  Ativan 0.5 mg p.o./sublingual every 4 hours   Questions and concerns addressed   Discussed with Dr Elisabeth PigeonVachhani family encouraged to call with questions or concerns  Total time spent on the unit was 35 minutes  Greater than 50% of the time was spent in counseling and coordination of care  Lorinda CreedMary Trinia Georgi NP  Palliative Medicine Team Team Phone # [phone] Pager [phone]

## 2018-05-18 NOTE — Progress Notes (Signed)
New hospice home referral received from [place] following  Palliative Medicine consult. Patient is a 77 year old woman with past medical history of chronic atrial fib,  on Coumadin, atrial flutter, chronic diastolic congestive heart failure, chronic kidney disease stage III, coronary artery disease, insulin dependent diabetes and Gout admitted form home on 9/21 following a fall. She has had 3 admissions in the past 6 months. She has been treated for sepsis, but has continued to decline. Palliative Medicine was consulted for goals of care. Per chart note review family reports continued functional and cognitive decline with poor oral intake. Family has chosen to focus on comfort with with transfer to the hospice home. Writer met in the family room with patient's son Haley Hill and daughter in law Haley Hill to initiate education regarding hospice services, philosophy and team approach to care with good understanding voiced. Questions answered, consents signed. Patient seen sitting up in bed, awake, just received liquid morphine for pain. Family reports continued abdmonial pain, patient did have 2 bowel movements today. She is not eating, was able to take oral medications this morning with ice cream. Patient information faxed to referral. Plan is for discharge to the hospice home tomorrow. Patient will require EMS transport and signed DNR. Hospital team updated. Writer to follow through discharge. Thank you for the opportunity to be involved in the care of this patient and her family. Flo Shanks RN, BS, [facility], hospital liaison [phone]

## 2018-05-18 NOTE — Evaluation (Signed)
Physical Therapy Evaluation Patient Details Name: Haley Hill MRN: [id number] DOB: 05-Aug-1941 Today's Date: 05/18/2018   History of Present Illness  Pt is a 77 y/o F who presented after falling.  Pt was recently admitted to the hospital with a UTI diagnosed with the pansensitive E. coli and discharged to rehab center with antibiotics.  Urinalysis remains abnormal.  CT head and imaging of C spine negative.  Pt's PMH Includes a-fib, a-flutter, CHF, CKD, gout, syncope, CABG.     Clinical Impression  Pt admitted with above diagnosis. Pt currently with functional limitations due to the deficits listed below (see PT Problem List). Haley Hill was pleasantly confused throughout session.  She currently requires max +2 assist for all aspects of bed mobility.  She requires max +2 assist to stand from an elevated bed as she was unable to rise from regular height bed.  Given pt's current mobility status, recommending SNF at d/c.   Pt will benefit from skilled PT to increase their independence and safety with mobility to allow discharge to the venue listed below.      Follow Up Recommendations SNF    Equipment Recommendations  Other (comment)(TBD at next venue of care)    Recommendations for Other Services       Precautions / Restrictions Precautions Precautions: Fall;Other (comment) Precaution Comments: monitor O2 Restrictions Weight Bearing Restrictions: No      Mobility  Bed Mobility Overal bed mobility: Needs Assistance Bed Mobility: Supine to Sit;Sit to Supine     Supine to sit: Max assist;+2 for physical assistance;HOB elevated Sit to supine: Max assist;+2 for physical assistance   General bed mobility comments: Cues provided for sequencing.  Max +2 assit for all aspects of bed mobility.   Transfers Overall transfer level: Needs assistance Equipment used: Rolling walker (2 wheeled) Transfers: Sit to/from Stand Sit to Stand: +2 physical assistance;Max assist;From elevated surface          General transfer comment: Attempted x2 without success.  Bed elevated and bed pad utilized to create "sling" to boost pt to standing with max +2 assist.  Pt able to take 2 small steps at bedside with L foot but demonstrates R knee buckle and is unable to advance R foot.   Ambulation/Gait             General Gait Details: Not safe to attempt at this time  Stairs            Wheelchair Mobility    Modified Rankin (Stroke Patients Only)       Balance Overall balance assessment: Needs assistance;History of Falls Sitting-balance support: Feet supported;Bilateral upper extremity supported Sitting balance-Leahy Scale: Poor Sitting balance - Comments: Pt relies on BUE support sitting EOB but is able to do so for several minutes with min guard   Standing balance support: Bilateral upper extremity supported;During functional activity Standing balance-Leahy Scale: Poor Standing balance comment: Relies on BUE support and min +1 assist to maintain static standing balance                             Pertinent Vitals/Pain Pain Assessment: Faces Faces Pain Scale: Hurts even more Pain Location: stomach(distended) Pain Descriptors / Indicators: Discomfort;Grimacing;Guarding Pain Intervention(s): Limited activity within patient's tolerance;Monitored during session    Home Living Family/patient expects to be discharged to:: Private residence Living Arrangements: Children Available Help at Discharge: Family;Available 24 hours/day Type of Home: House Home Access: Stairs to enter Entrance  Stairs-Rails: Right;Left Entrance Stairs-Number of Steps: 5 Home Layout: One level Home Equipment: Walker - 2 wheels;Walker - 4 wheels Additional Comments: Information taken from chart review as pt unrealiable historian due to h/o dementia    Prior Function Level of Independence: Independent with assistive device(s)         Comments: amb with 2WW. Functional decline and  fall since home from SNF.      Hand Dominance        Extremity/Trunk Assessment   Upper Extremity Assessment Upper Extremity Assessment: Overall WFL for tasks assessed    Lower Extremity Assessment Lower Extremity Assessment: (RLE strength grossly 3-/5, LLE 3/5)       Communication   Communication: No difficulties  Cognition Arousal/Alertness: Awake/alert Behavior During Therapy: Flat affect Overall Cognitive Status: History of cognitive impairments - at baseline                                        General Comments General comments (skin integrity, edema, etc.): SpO2 remains at or above 93% on RA throughout session     Exercises General Exercises - Lower Extremity Ankle Circles/Pumps: AROM;Both;10 reps;Supine Straight Leg Raises: AROM;Both;5 reps;Supine   Assessment/Plan    PT Assessment Patient needs continued PT services  PT Problem List Decreased strength;Decreased activity tolerance;Decreased balance;Decreased mobility;Decreased cognition;Decreased knowledge of use of DME;Decreased safety awareness;Cardiopulmonary status limiting activity;Pain       PT Treatment Interventions DME instruction;Gait training;Stair training;Functional mobility training;Therapeutic activities;Therapeutic exercise;Balance training;Neuromuscular re-education;Cognitive remediation;Patient/family education;Wheelchair mobility training    PT Goals (Current goals can be found in the Care Plan section)  Acute Rehab PT Goals Patient Stated Goal: none stated PT Goal Formulation: Patient unable to participate in goal setting Time For Goal Achievement: 06/01/18 Potential to Achieve Goals: Fair    Frequency Min 2X/week   Barriers to discharge Other (comment) Family likely unable to provide level of assist the pt currently requires    Co-evaluation               AM-PAC PT "6 Clicks" Daily Activity  Outcome Measure Difficulty turning over in bed (including  adjusting bedclothes, sheets and blankets)?: Unable Difficulty moving from lying on back to sitting on the side of the bed? : Unable Difficulty sitting down on and standing up from a chair with arms (e.g., wheelchair, bedside commode, etc,.)?: Unable Help needed moving to and from a bed to chair (including a wheelchair)?: A Lot Help needed walking in hospital room?: Total Help needed climbing 3-5 steps with a railing? : Total 6 Click Score: 7    End of Session Equipment Utilized During Treatment: Gait belt;Oxygen Activity Tolerance: Patient limited by fatigue Patient left: in bed;with call bell/phone within reach;with bed alarm set Nurse Communication: Mobility status;Other (comment)(SpO2) PT Visit Diagnosis: Unsteadiness on feet (R26.81);Other abnormalities of gait and mobility (R26.89);History of falling (Z91.81);Muscle weakness (generalized) (M62.81)    Time: 1610-9604 PT Time Calculation (min) (ACUTE ONLY): 29 min   Charges:   PT Evaluation $PT Eval Moderate Complexity: 1 Mod PT Treatments $Therapeutic Activity: 8-22 mins        Encarnacion Chu PT, DPT 05/18/2018, 9:57 AM

## 2018-05-18 NOTE — Progress Notes (Signed)
[employer] [facility] - [place] [facility]   PATIENT NAME: Haley Hill    MR#:  [id number]  DATE OF BIRTH:  08/28/1940  SUBJECTIVE:  CHIEF COMPLAINT:   Chief Complaint  Patient presents with  . Shoulder Pain  . Fall   Recurrent UTIs, recently treated for that and sent to rehab and went home last week.  Came with persistent weakness and confusion noted to have UTI again.  Also noted to have some urinary retention and postvoid volume of 400 mL.  Patient is more awake today. Very weak.  Has abdominal distention and complaining of some discomfort.  REVIEW OF SYSTEMS:  CONSTITUTIONAL: No fever,have fatigue or weakness.  EYES: No blurred or double vision.  EARS, NOSE, AND THROAT: No tinnitus or ear pain.  RESPIRATORY: No cough, shortness of breath, wheezing or hemoptysis.  CARDIOVASCULAR: No chest pain, orthopnea, edema.  GASTROINTESTINAL: No nausea, vomiting, diarrhea or abdominal pain.  GENITOURINARY: No dysuria, hematuria.  ENDOCRINE: No polyuria, nocturia,  HEMATOLOGY: No anemia, easy bruising or bleeding SKIN: No rash or lesion. MUSCULOSKELETAL: No joint pain or arthritis.   NEUROLOGIC: No tingling, numbness, generalized weakness.  PSYCHIATRY: No anxiety or depression.   ROS  DRUG ALLERGIES:   Allergies  Allergen Reactions  . Ciprofloxacin Itching and Rash  . Fosamax [Alendronate Sodium] Other (See Comments)    Reaction: Ulcers in mouth and esophagus  . Iron Other (See Comments)    VITALS:  Blood pressure 104/62, pulse 69, temperature 98.2 F (36.8 C), temperature source Axillary, resp. rate 18, height 5\' 3"  (1.6 m), weight 94.8 kg, SpO2 97 %.  PHYSICAL EXAMINATION:  GENERAL:  77 y.o.-year-old patient lying in the bed with no acute distress.  EYES: Pupils equal, round, reactive to light and accommodation. No scleral icterus. Extraocular muscles intact.  HEENT: Head atraumatic, normocephalic. Oropharynx and nasopharynx clear.  NECK:  Supple, no jugular venous  distention. No thyroid enlargement, no tenderness.  LUNGS: Normal breath sounds bilaterally, no wheezing, rales,rhonchi or crepitation. No use of accessory muscles of respiration.  CARDIOVASCULAR: S1, S2 normal. No murmurs, rubs, or gallops.  ABDOMEN: Soft, nontender, distended. Bowel sounds weak. No organomegaly or mass.  EXTREMITIES: No pedal edema, cyanosis, or clubbing.  NEUROLOGIC: Cranial nerves II through XII are intact. Muscle strength 2-3/5 in all extremities. Sensation intact. Gait not checked. Appears very weak, frail. PSYCHIATRIC: The patient is alert and oriented x 2.  SKIN: No obvious rash, lesion, or ulcer.   Physical Exam LABORATORY PANEL:   CBC Recent Labs  Lab 05/18/18 0553  WBC 7.7  HGB 10.7*  HCT 30.9*  PLT 61*   ------------------------------------------------------------------------------------------------------------------  Chemistries  Recent Labs  Lab 05/16/18 0630  05/18/18 0553  NA  --    < > 136  K  --    < > 4.1  CL  --    < > 104  CO2  --    < > 23  GLUCOSE  --    < > 139*  BUN  --    < > 60*  CREATININE  --    < > 4.16*  CALCIUM  --    < > 7.6*  AST 32  --   --   ALT 20  --   --   ALKPHOS 88  --   --   BILITOT 2.5*  --   --    < > = values in this interval not displayed.   ------------------------------------------------------------------------------------------------------------------  Cardiac Enzymes No results for input(s):  TROPONINI in the last 168 hours. ------------------------------------------------------------------------------------------------------------------  RADIOLOGY:  Dg Abd 1 View  Result Date: 05/17/2018 CLINICAL DATA:  History of ileus, chronic kidney disease, diabetes EXAM: ABDOMEN - 1 VIEW COMPARISON:  Ultrasound the abdomen of 05/15/2018 FINDINGS: Supine views of the abdomen show slight gaseous distention of the stomach. There is some small bowel gas present but no significant distention of either large or small  bowel is seen. No significant stool burden is seen. Considerable abdominal aortic atherosclerosis is noted. IMPRESSION: 1. Nonspecific bowel gas pattern. Minimal amount of small bowel gas of questionable significance. 2. Moderate to severe abdominal aortic atherosclerosis. Electronically Signed   By: Dwyane Dee M.D.   On: 05/17/2018 14:51    ASSESSMENT AND PLAN:   Active Problems:   Acute renal failure (HCC)   AKI (acute kidney injury) (HCC)   DNR (do not resuscitate) discussion   Palliative care by specialist   Transient alteration of awareness   Pain, generalized   * Sepsis-  with leukocytosis and tachycardia at the time of admission Implement sepsis protocol with IV fluids, antibiotics Follow lactic acid and procalcitonin level IV antibiotics cefepime IV fluids Patient is delirious from sepsis continue close monitoring Sent blood culture which was not sent on admission.   Blood cx negatvie, ur cx have klebsiella .   Family had agreed on Comfort care now.  * Acute cystitis-with history of recurrent urinary tract infections Patient also had E. coli bacteremia last time, noted to have 400 mL of post void bladder volume, I would place a Foley catheter and would suggest to go to urologist after infection clears up. Renal US is normal except for a cyst.  * AKI on CKD -3  Hold allopurinol IVF  Avoid nehrotoxins   Pt was also on bactrim, this could have added the insult along with sepsis and Baseline CKD.  Nephro consult. Worsening renal func.   Family have agreed on comfort care now.  * ch atrial fibrillation with RVR  Rate improved with IV fluids  on Coumadin-therapeutic INR  * Chronic diastolic congestive heart failure Not fluid overloaded at this time.  Lasix on hold in view of acute kidney injury Previous ejection fraction from 03/2015 and 50 to 55% Giving gentle IV fluids, due to renal failure, monitor.  * Coronary artery disease status post CABG  *  Insulin-dependent diabetes mellitus ISS 70/30 dose reduced to 6 units bid with meals  As decreased intake today, check KUB and hold Insuline.  * Hypothyroidism Continue Synthyroid, check TSH.  * Diabetic neuropathy-continue gabapentin  *Acute respiratory failure with hypoxia Chest Xray with chronic changes. Monitor.  All the records are reviewed and case discussed with Care Management/Social Workerr. Management plans discussed with the patient, family and they are in agreement.  CODE STATUS: Full.  TOTAL TIME TAKING CARE OF THIS PATIENT: 35 minutes.   Family have decided comfort care. Discharge to hospice home tomorrow.  POSSIBLE D/C IN 1 DAYS, DEPENDING ON CLINICAL CONDITION.   Altamese Dilling M.D on 05/18/2018   Between 7am to 6pm - Pager - [phone]  After 6pm go to [email or web address] - password [email or web address]  [facility]  Office  [phone]  CC: Primary care physician; Barbette Reichmann, MD  Note: This dictation was prepared with Dragon dictation along with smaller phrase technology. Any transcriptional errors that result from this process are unintentional.

## 2018-05-18 NOTE — Progress Notes (Signed)
PT Cancellation Note  Patient Details Name: Haley Hill MRN: [id number] DOB: 15-Mar-1941   Cancelled Treatment:    Reason Eval/Treat Not Completed: Other (comment).  Decision has been made for transition to comfort care.  PT will sign off.    Encarnacion Chu PT, DPT 05/18/2018, 3:36 PM

## 2018-05-19 MED ORDER — MORPHINE SULFATE (CONCENTRATE) 10 MG/0.5ML PO SOLN: 10.0000 mg | ORAL | Status: AC | PRN

## 2018-05-19 MED ORDER — LORAZEPAM 0.5 MG PO TABS: 0.5000 mg | ORAL_TABLET | ORAL | 0 refills | Status: AC | PRN

## 2018-05-19 MED ORDER — MORPHINE SULFATE (CONCENTRATE) 10 MG/0.5ML PO SOLN
5.0000 mg | Freq: Once | ORAL | Status: AC
  Administered 2018-05-19: 5 mg via ORAL
  Filled 2018-05-19: qty 1

## 2018-05-19 MED ORDER — IPRATROPIUM-ALBUTEROL 0.5-2.5 (3) MG/3ML IN SOLN: 3.0000 mL | Freq: Four times a day (QID) | RESPIRATORY_TRACT | Status: DC

## 2018-05-19 MED ORDER — MORPHINE SULFATE (CONCENTRATE) 10 MG/0.5ML PO SOLN: 10.0000 mg | ORAL | Status: DC | PRN

## 2018-05-19 MED ORDER — BISACODYL 10 MG RE SUPP: 10.0000 mg | Freq: Every day | RECTAL | 0 refills | Status: AC | PRN

## 2018-05-19 MED ORDER — IPRATROPIUM-ALBUTEROL 0.5-2.5 (3) MG/3ML IN SOLN
3.0000 mL | Freq: Four times a day (QID) | RESPIRATORY_TRACT | Status: DC | PRN
  Administered 2018-05-19: 3 mL via RESPIRATORY_TRACT
  Filled 2018-05-19: qty 3

## 2018-05-19 MED ORDER — ONDANSETRON HCL 4 MG/2ML IJ SOLN: 4.0000 mg | Freq: Four times a day (QID) | INTRAMUSCULAR | 0 refills | Status: AC | PRN

## 2018-05-19 MED ORDER — ACETAMINOPHEN 325 MG PO TABS: 650.0000 mg | ORAL_TABLET | Freq: Four times a day (QID) | ORAL | Status: AC | PRN

## 2018-05-19 MED ORDER — IPRATROPIUM-ALBUTEROL 0.5-2.5 (3) MG/3ML IN SOLN: 3.0000 mL | Freq: Four times a day (QID) | RESPIRATORY_TRACT | Status: AC | PRN

## 2018-05-19 NOTE — Discharge Summary (Signed)
[facility] - [place] at [facility]   PATIENT NAME: Haley Hill    MR#:  [id number]  DATE OF BIRTH:  [birth year]  DATE OF ADMISSION:  05/14/2018 ADMITTING PHYSICIAN: Ramonita Lab, MD  DATE OF DISCHARGE:  05/19/18   PRIMARY CARE PHYSICIAN: Barbette Reichmann, MD    ADMISSION DIAGNOSIS:  Fall  DISCHARGE DIAGNOSIS:  Active Problems:   Acute renal failure (HCC)   AKI (acute kidney injury) (HCC)   DNR (do not resuscitate) discussion   Palliative care by specialist   Transient alteration of awareness   Pain, generalized   SECONDARY DIAGNOSIS:   Past Medical History:  Diagnosis Date  . Atrial fibrillation (HCC)   . Atrial flutter (HCC)   . CHF (congestive heart failure) (HCC)   . CKD (chronic kidney disease) stage 3, GFR 30-59 ml/min (HCC)   . Coronary disease   . Dehydration   . Diabetes (HCC)   . Fracture of humeral shaft, right, closed   . Gout   . Hypertension   . Hypothyroidism   . Peripheral neuropathy   . Renal insufficiency   . Right renal artery stenosis (HCC)   . Syncope and collapse   . Vitiligo     HOSPITAL COURSE:  HPI  Haley Hill  is a 77 y.o. female with a known history of chronic atrial for ablation on Coumadin INR is therapeutic, chronic diastolic congestive heart failure, chronic kidney disease stage III, coronary artery disease, insulin requiring diabetes mellitus, gout and multiple other medical problems is brought into the emergency department by her son after she sustained a fall.  Patient was just admitted to the hospital with a UTI diagnosed with the pansensitive E. coli and discharged to rehab center with antibiotics.  Patient has completed the antibiotic course and was released from rehabilitation center to home approximately 1 week ago.  Patient was seen by primary care physician yesterday and she was started on another antibiotic by mouth for recurrent UTI as reported by the son.  Patient was found to be extremely dry and  lethargic.  Urinalysis is abnormal.  CT head densities C-spine with no acute findings.  Hospitalist team is called to admit the patient.  The patient is alert unable to give any history    Sepsis-  with leukocytosis and tachycardia at the time of admission Implemented sepsis protocol with IV fluids, antibiotics Pt clinically didn't improve   Blood cx negatvie, ur cx have klebsiella .   Family had agreed on Comfort care  * Acute cystitis-with history of recurrent urinary tract infections Renal US is normal except for a cyst.  * AKI on CKD -3    Family have agreed on comfort care .  * chatrial fibrillationwith RVR   * Chronic diastolic congestive heart failure * Coronary artery disease status post CABG  * Insulin-dependent diabetes mellitus  * Hypothyroidism * Diabetic neuropathy-continue gabapentin  *Acute respiratory failure with hypoxia   Family requested comfort care , transfer pt to hospice home today  DISCHARGE CONDITIONS:   guarded  CONSULTS OBTAINED:  Treatment Team:  Mosetta Pigeon, MD   PROCEDURES   DRUG ALLERGIES:   Allergies  Allergen Reactions  . Ciprofloxacin Itching and Rash  . Fosamax [Alendronate Sodium] Other (See Comments)    Reaction: Ulcers in mouth and esophagus  . Iron Other (See Comments)    DISCHARGE MEDICATIONS:   Allergies as of 05/19/2018      Reactions   Ciprofloxacin Itching, Rash   Fosamax [alendronate  Sodium] Other (See Comments)   Reaction: Ulcers in mouth and esophagus   Iron Other (See Comments)      Medication List    STOP taking these medications   allopurinol 100 MG tablet Commonly known as:  ZYLOPRIM   calcitRIOL 0.5 MCG capsule Commonly known as:  ROCALTROL   cefUROXime 250 MG tablet Commonly known as:  CEFTIN   Cyanocobalamin 5000 MCG Lozg   diclofenac sodium 1 % Gel Commonly known as:  VOLTAREN   furosemide 20 MG tablet Commonly known as:  LASIX   gabapentin 800 MG tablet Commonly known  as:  NEURONTIN   glipiZIDE 10 MG tablet Commonly known as:  GLUCOTROL   HYDROcodone-acetaminophen 5-325 MG tablet Commonly known as:  NORCO/VICODIN   insulin NPH-regular Human (70-30) 100 UNIT/ML injection Commonly known as:  NOVOLIN 70/30   isosorbide mononitrate 20 MG tablet Commonly known as:  ISMO,MONOKET   levothyroxine 112 MCG tablet Commonly known as:  SYNTHROID, LEVOTHROID   omeprazole 20 MG capsule Commonly known as:  PRILOSEC   ONE TOUCH ULTRA TEST test strip Generic drug:  glucose blood   potassium chloride SA 20 MEQ tablet Commonly known as:  K-DUR,KLOR-CON   rOPINIRole 0.5 MG tablet Commonly known as:  REQUIP   senna-docusate 8.6-50 MG tablet Commonly known as:  Senokot-S   simvastatin 40 MG tablet Commonly known as:  ZOCOR   trimethoprim 100 MG tablet Commonly known as:  TRIMPEX   Vitamin D 2000 units Caps   warfarin 1 MG tablet Commonly known as:  COUMADIN   warfarin 2 MG tablet Commonly known as:  COUMADIN     TAKE these medications   acetaminophen 325 MG tablet Commonly known as:  TYLENOL Take 2 tablets (650 mg total) by mouth every 6 (six) hours as needed for mild pain. What changed:    medication strength  how much to take  when to take this  reasons to take this  additional instructions   bisacodyl 10 MG suppository Commonly known as:  DULCOLAX Place 1 suppository (10 mg total) rectally daily as needed for mild constipation or moderate constipation.   ipratropium-albuterol 0.5-2.5 (3) MG/3ML Soln Commonly known as:  DUONEB Take 3 mLs by nebulization every 6 (six) hours as needed.   LORazepam 0.5 MG tablet Commonly known as:  ATIVAN Take 1 tablet (0.5 mg total) by mouth every 4 (four) hours as needed for anxiety or sleep.   morphine CONCENTRATE 10 MG/0.5ML Soln concentrated solution Take 0.5 mLs (10 mg total) by mouth every 2 (two) hours as needed for severe pain or shortness of breath.   ondansetron 4 MG/2ML Soln  injection Commonly known as:  ZOFRAN Inject 2 mLs (4 mg total) into the vein every 6 (six) hours as needed for nausea.        DISCHARGE INSTRUCTIONS:   transfer pt to hospice home today   DIET:  Regular diet prn  DISCHARGE CONDITION:  guarded  ACTIVITY:  Activity as tolerated  OXYGEN:  Home Oxygen: Yes.     Oxygen Delivery: 2 liters/min via Patient connected to nasal cannula oxygen  DISCHARGE LOCATION:   transfer pt to hospice home today   If you experience worsening of your admission symptoms, develop shortness of breath, life threatening emergency, suicidal or homicidal thoughts you must seek medical attention immediately by calling 911 or calling your MD immediately  if symptoms less severe.  You Must read complete instructions/literature along with all the possible adverse reactions/side effects for all the  Medicines you take and that have been prescribed to you. Take any new Medicines after you have completely understood and accpet all the possible adverse reactions/side effects.   Please note  You were cared for by a hospitalist during your hospital stay. If you have any questions about your discharge medications or the care you received while you were in the hospital after you are discharged, you can call the unit and asked to speak with the hospitalist on call if the hospitalist that took care of you is not available. Once you are discharged, your primary care physician will handle any further medical issues. Please note that NO REFILLS for any discharge medications will be authorized once you are discharged, as it is imperative that you return to your primary care physician (or establish a relationship with a primary care physician if you do not have one) for your aftercare needs so that they can reassess your need for medications and monitor your lab values.     Today  Chief Complaint  Patient presents with  . Shoulder Pain  . Fall   Pt is comfort care    ROS:  Patientunobtainable  VITAL SIGNS:  Blood pressure 113/78, pulse 66, temperature 98.4 F (36.9 C), temperature source Oral, resp. rate 18, height 5\' 3"  (1.6 m), weight 94.8 kg, SpO2 98 %.  I/O:    Intake/Output Summary (Last 24 hours) at 05/19/2018 1044 Last data filed at 05/19/2018 0900 Gross per 24 hour  Intake 394.24 ml  Output 730 ml  Net -335.76 ml    PHYSICAL EXAMINATION:  GENERAL:  77 y.o.-year-old patient lying in the bed with no acute distress.  EYES: Pupils equal, round, reactive to light and accommodation. No scleral icterus. Extraocular muscles intact.  HEENT: Head atraumatic, normocephalic. Oropharynx and nasopharynx clear.  NECK:  Supple, no jugular venous distention. No thyroid enlargement, no tenderness.  LUNGS: Normal breath sounds bilaterally, no wheezing, rales,rhonchi or crepitation. No use of accessory muscles of respiration.  CARDIOVASCULAR: S1, S2 normal. No murmurs, rubs, or gallops.  ABDOMEN: Soft, non-tender, non-distended. Bowel sounds present.  EXTREMITIES: No pedal edema, cyanosis, or clubbing.  NEUROLOGIC: Not used to debride but Neurologic lethargic  PSYCHIATRIC: The patient is lethargic  SKIN: No obvious rash, lesion, or ulcer.   DATA REVIEW:   CBC Recent Labs  Lab 05/18/18 0553  WBC 7.7  HGB 10.7*  HCT 30.9*  PLT 61*    Chemistries  Recent Labs  Lab 05/16/18 0630  05/18/18 0553  NA  --    < > 136  K  --    < > 4.1  CL  --    < > 104  CO2  --    < > 23  GLUCOSE  --    < > 139*  BUN  --    < > 60*  CREATININE  --    < > 4.16*  CALCIUM  --    < > 7.6*  AST 32  --   --   ALT 20  --   --   ALKPHOS 88  --   --   BILITOT 2.5*  --   --    < > = values in this interval not displayed.    Cardiac Enzymes No results for input(s): TROPONINI in the last 168 hours.  Microbiology Results  Results for orders placed or performed during the hospital encounter of 05/14/18  Urine Culture     Status: Abnormal   Collection Time:  05/14/18  9:23 AM  Result Value Ref Range Status   Specimen Description   Final    URINE, RANDOM Performed at [facility], [address] [place], [place] [address]    Special Requests   Final    NONE Performed at [facility], [address] [place], [place] [address]    Culture 60,000 COLONIES/mL KLEBSIELLA PNEUMONIAE (A)  Final   Report Status 05/16/2018 FINAL  Final   Organism ID, Bacteria KLEBSIELLA PNEUMONIAE (A)  Final      Susceptibility   Klebsiella pneumoniae - MIC*    AMPICILLIN >=32 RESISTANT Resistant     CEFAZOLIN 8 SENSITIVE Sensitive     CEFTRIAXONE <=1 SENSITIVE Sensitive     CIPROFLOXACIN <=0.25 SENSITIVE Sensitive     GENTAMICIN <=1 SENSITIVE Sensitive     IMIPENEM <=0.25 SENSITIVE Sensitive     NITROFURANTOIN 128 RESISTANT Resistant     TRIMETH/SULFA >=320 RESISTANT Resistant     AMPICILLIN/SULBACTAM >=32 RESISTANT Resistant     PIP/TAZO >=128 RESISTANT Resistant     Extended ESBL NEGATIVE Sensitive     * 60,000 COLONIES/mL KLEBSIELLA PNEUMONIAE  CULTURE, BLOOD (ROUTINE X 2) w Reflex to ID Panel     Status: None (Preliminary result)   Collection Time: 05/15/18  2:18 PM  Result Value Ref Range Status   Specimen Description BLOOD LT HAND  Final   Special Requests   Final    BOTTLES DRAWN AEROBIC AND ANAEROBIC Blood Culture results may not be optimal due to an excessive volume of blood received in culture bottles   Culture   Final    NO GROWTH 4 DAYS Performed at [facility], [address], [place], [place] [address]    Report Status PENDING  Incomplete  CULTURE, BLOOD (ROUTINE X 2) w Reflex to ID Panel     Status: None (Preliminary result)   Collection Time: 05/15/18  2:26 PM  Result Value Ref Range Status   Specimen Description BLOOD RT ARM  Final   Special Requests   Final    BOTTLES DRAWN AEROBIC AND ANAEROBIC Blood Culture results may not be optimal due to an excessive volume of blood received in culture bottles    Culture   Final    NO GROWTH 4 DAYS Performed at [facility], [address], [place], [place] [address]    Report Status PENDING  Incomplete    RADIOLOGY:  Dg Chest 1 View  Result Date: 05/15/2018 CLINICAL DATA:  Hypoxia. EXAM: CHEST  1 VIEW COMPARISON:  05/14/2018 FINDINGS: 1546 hours. Low lung volumes. The cardio pericardial silhouette is enlarged. Interstitial markings are diffusely coarsened with chronic features. No airspace edema. There is some atelectasis at the left base with potential tiny left effusion. The visualized bony structures of the thorax are intact. Status post median sternotomy. IMPRESSION: Low lung volumes with cardiomegaly and chronic interstitial coarsening. Left base atelectasis with probable tiny left pleural effusion. Electronically Signed   By: Kennith Center M.D.   On: 05/15/2018 16:29   Dg Abd 1 View  Result Date: 05/17/2018 CLINICAL DATA:  History of ileus, chronic kidney disease, diabetes EXAM: ABDOMEN - 1 VIEW COMPARISON:  Ultrasound the abdomen of 05/15/2018 FINDINGS: Supine views of the abdomen show slight gaseous distention of the stomach. There is some small bowel gas present but no significant distention of either large or small bowel is seen. No significant stool burden is seen. Considerable abdominal aortic atherosclerosis is noted. IMPRESSION: 1. Nonspecific bowel gas pattern. Minimal  amount of small bowel gas of questionable significance. 2. Moderate to severe abdominal aortic atherosclerosis. Electronically Signed   By: Dwyane Dee M.D.   On: 05/17/2018 14:51   US Renal  Result Date: 05/15/2018 CLINICAL DATA:  Acute renal failure EXAM: RENAL / URINARY TRACT ULTRASOUND COMPLETE COMPARISON:  04/12/2018 FINDINGS: Right Kidney: Length: 12.5 cm. Mild cortical thinning. Normal echotexture. No mass or hydronephrosis. Left Kidney: Length: 12.1 cm. Cortical thinning. No hydronephrosis. Large cyst replaces much of the mid and lower pole of the left kidney  measuring up to 12 cm. Bladder: Appears normal for degree of bladder distention. IMPRESSION: No acute findings.  No hydronephrosis. Large left mid and lower pole renal cyst, 12 cm. Electronically Signed   By: Charlett Nose M.D.   On: 05/15/2018 16:35   US Abdomen Limited Ruq  Result Date: 05/15/2018 CLINICAL DATA:  Elevated bilirubin EXAM: ULTRASOUND ABDOMEN LIMITED RIGHT UPPER QUADRANT COMPARISON:  04/16/2018 FINDINGS: Gallbladder: Layering gallstones with gallbladder wall thickness measuring up to 5-6 mm. No substantial pericholecystic fluid. Sonographer reports no sonographic Murphy sign. Common bile duct: Diameter: 7 mm, upper normal for patient age. Liver: Heterogeneous liver echotexture with increased echogenicity and decreased acoustic through transmission. Nodular contour again noted. Note: Small volume ascites evident. Portal vein is patent on color Doppler imaging with normal direction of blood flow towards the liver. IMPRESSION: 1. Cholelithiasis with mild gallbladder wall thickening. Correlation for acute cholecystitis suggested. 2. 7 mm common bile duct diameter, upper normal for patient age. Common bile duct measured at 4 mm diameter on the study from 1 month ago. Given interval increase and elevated bilirubin, MRCP may prove helpful. 3. Echogenic liver with nodular contour suggests cirrhosis. Electronically Signed   By: Kennith Center M.D.   On: 05/15/2018 16:34    EKG:   Orders placed or performed during the hospital encounter of 05/14/18  . ED EKG  . ED EKG  . EKG 12-Lead  . EKG 12-Lead  . EKG 12-Lead  . EKG 12-Lead  . EKG 12-Lead  . EKG 12-Lead      Management plans discussed with the patient's son and daughter-in -law they are in agreement.  CODE STATUS:     Code Status Orders  (From admission, onward)         Start     Ordered   05/18/18 1116  Do not attempt resuscitation (DNR)  Continuous    Question Answer Comment  In the event of cardiac or respiratory ARREST  Do not call a "code blue"   In the event of cardiac or respiratory ARREST Do not perform Intubation, CPR, defibrillation or ACLS   In the event of cardiac or respiratory ARREST Use medication by any route, position, wound care, and other measures to relive pain and suffering. May use oxygen, suction and manual treatment of airway obstruction as needed for comfort.      05/18/18 1116        Code Status History    Date Active Date Inactive Code Status Order ID Comments User Context   05/17/2018 1515 05/18/2018 1116 Partial Code [id number]  Canary Brim, NP Inpatient   05/14/2018 1301 05/17/2018 1515 Full Code [id number]  Ramonita Lab, MD Inpatient   04/12/2018 0442 04/18/2018 1913 Full Code [id number]  Barbaraann Rondo, MD ED   02/20/2018 2122 02/23/2018 1724 Full Code [id number]  Ihor Austin, MD Inpatient   04/29/2015 1726 05/02/2015 2124 Full Code [id number]  Adrian Saran, MD Inpatient   01/25/2015 1633 01/28/2015 2440  Full Code [id number]  Milagros Loll, MD ED      TOTAL TIME TAKING CARE OF THIS PATIENT: 41  minutes.   Note: This dictation was prepared with Dragon dictation along with smaller phrase technology. Any transcriptional errors that result from this process are unintentional.   @MEC @  on 05/19/2018 at 10:44 AM  Between 7am to 6pm - Pager - [phone]  After 6pm go to [email or web address] - password [email or web address] [facility]  [facility]  Office  [phone]  CC: Primary care physician; Barbette Reichmann, MD

## 2018-05-19 NOTE — Progress Notes (Signed)
Pt prepared for d/c to hospice home. IV SL dressing clean and intact. Foley still in place per order. Skin intact except as charted in most recent assessments. Vitals are stable. Report called to receiving home per hospice nurse. Pt to be transported by ambulance service. Family at bedside and updated.   Haley Hill [employer]

## 2018-05-19 NOTE — Progress Notes (Signed)
   05/19/18 1100  Clinical Encounter Type  Visited With Patient and family together  Visit Type Follow-up;Spiritual support  Referral From Nurse  Spiritual Encounters  Spiritual Needs Emotional;Prayer  Stress Factors  Patient Stress Factors  (EOL)  Family Stress Factors  (Grief, EOL )   Chaplain met with patient and family and offered prayer, pastoral presence, emotional support, energetic prayer. Chaplain spent most of the encounter holding the patient's hand and praying for her. Chaplain remained with the patient and family until EMS arrived to transport the patient to [place]. The encounter was ended with a blessing for the patient.

## 2018-05-19 NOTE — Clinical Social Work Note (Signed)
Palliative informed CSW that a hospice home referral was needed. Family preferred [facility]. Patient to hospice home today. Clydie Braun with hospice home of [place] has made the arrangements with family. York Spaniel MSW,LCSW [phone]

## 2018-05-19 NOTE — Discharge Instructions (Signed)
ransfer pt to hospice home today

## 2018-05-19 NOTE — Progress Notes (Signed)
Follow up visit made to new hospice home referral. Patient seen lying in bed, audible wheezing noted. Son Rosalia Hammers and daughter in law at bedside. Patient had just received a dose of liquid morphine for increased work of breathing. Plan is for discharge this morning to the hospice home. Discharge summary included in discharge packet. Emotional support given to patient, family and staff. Report called to the hospice home, EMS notified for transport. Hospital care team and family all aware. Thank you. Dayna Barker RN, BSN, [facility] [place], hospital Liaison [phone]

## 2018-05-19 NOTE — Care Management Important Message (Signed)
Patient discharged prior to my arrival to deliver another concurrent copy of Medicare IM.

## 2018-05-20 LAB — CULTURE, BLOOD (ROUTINE X 2)
CULTURE: NO GROWTH
CULTURE: NO GROWTH

## 2018-05-24 DEATH — deceased

## 2018-06-27 ENCOUNTER — Ambulatory Visit: Payer: Medicare Other | Admitting: Podiatry

## 2019-03-14 IMAGING — US US RENAL
1 series · 14 of 25 positions shown · non-contrast
Comparison: Abdominal ultrasound 10/08/2016

CLINICAL DATA: Acute renal failure.

EXAM:
RENAL / URINARY TRACT ULTRASOUND COMPLETE

[Series 1: us renal · 0.26mm/px · 14 of 27 slices shown]
[im 1/27]
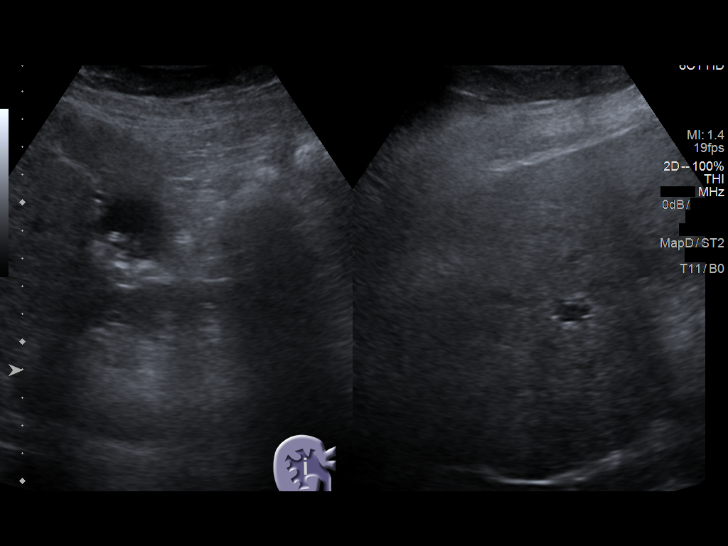
[im 3/27]
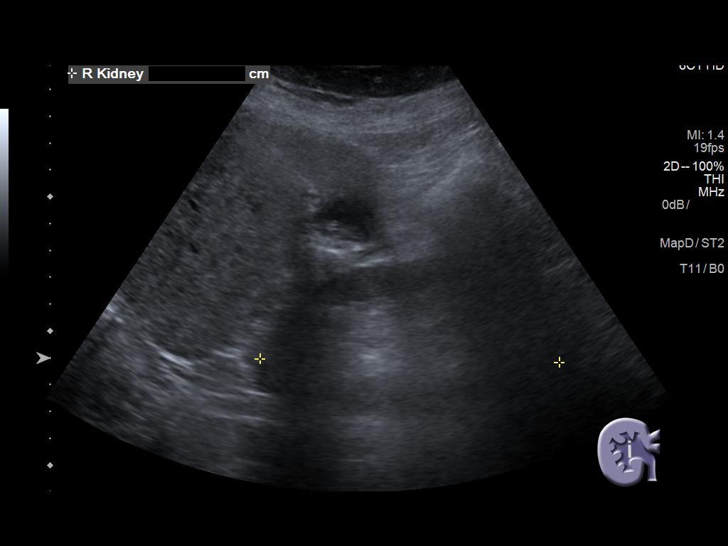
[im 5/27]
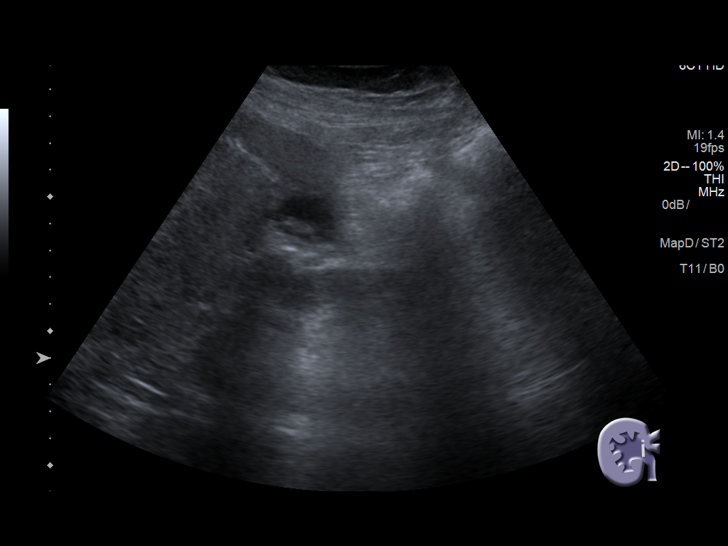
[im 7/27]
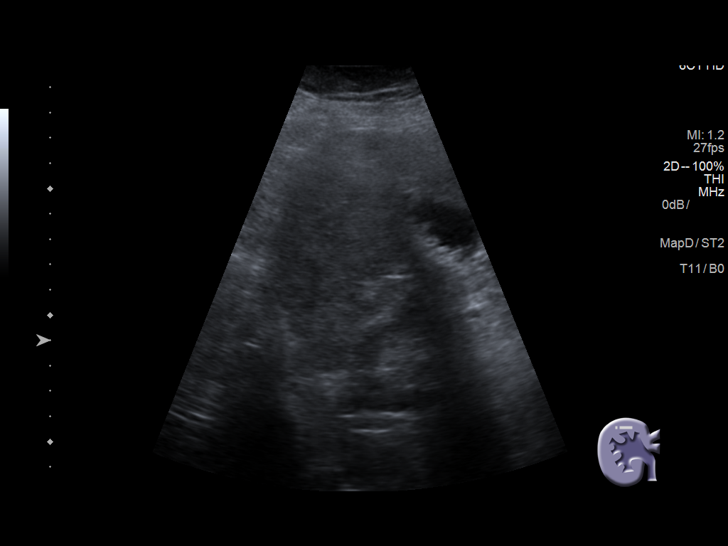
[im 9/27]
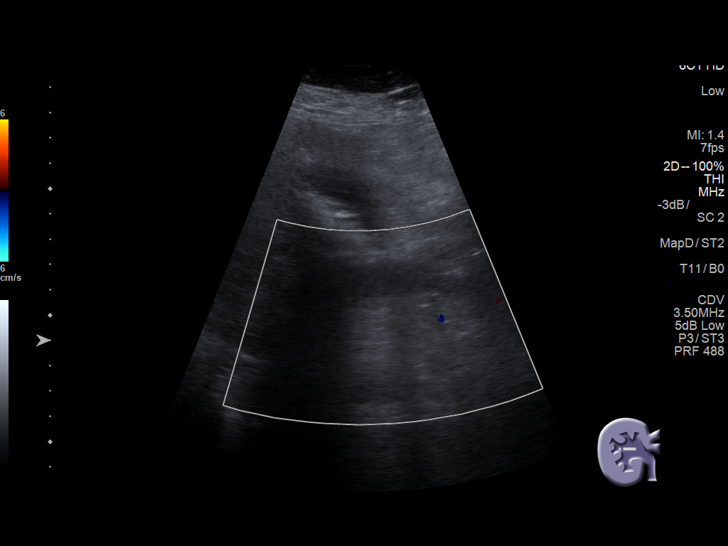
[im 10/27]
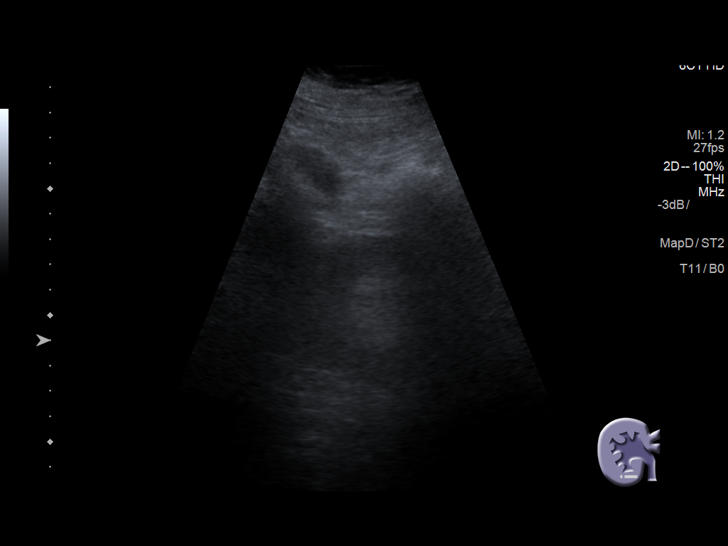
[im 12/27]
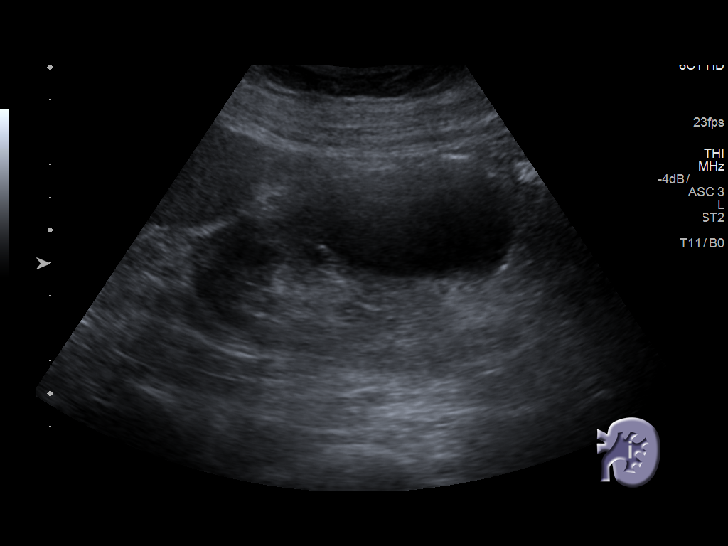
[im 15/27]
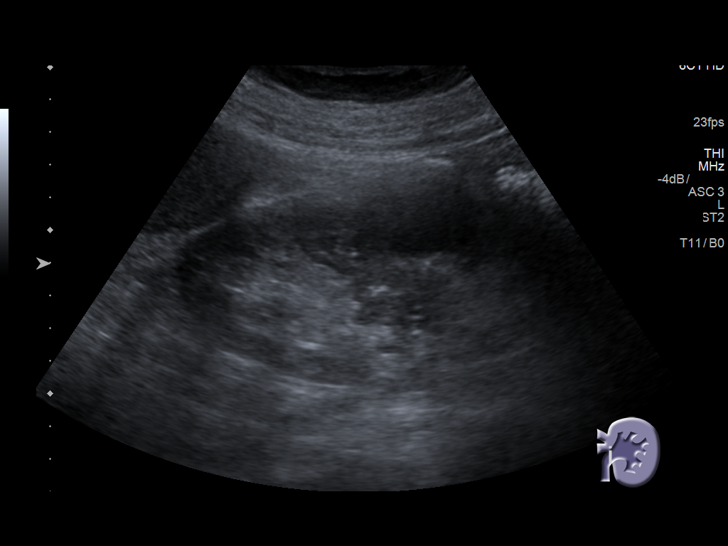
[im 17/27]
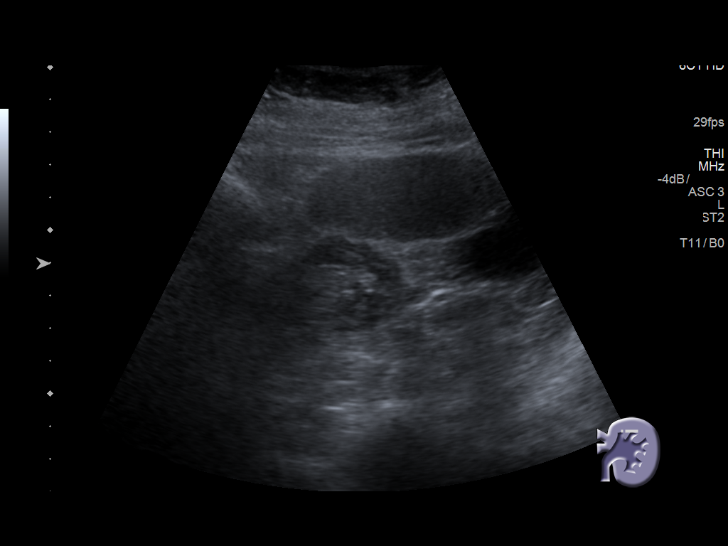
[im 18/27]
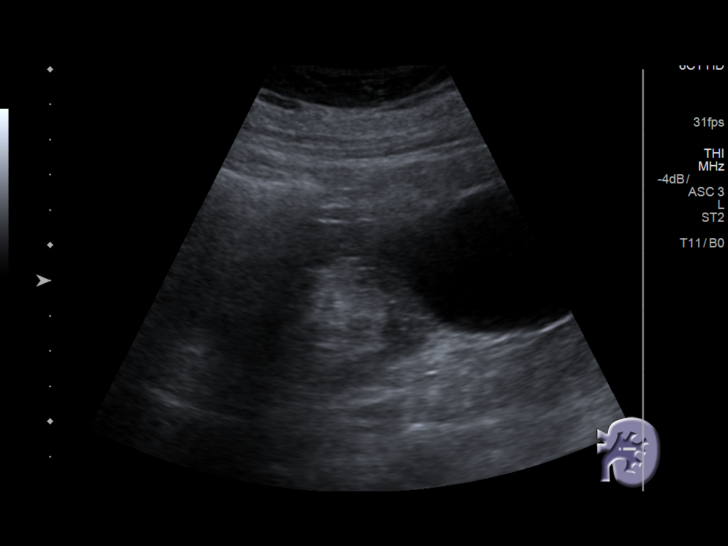
[im 20/27]
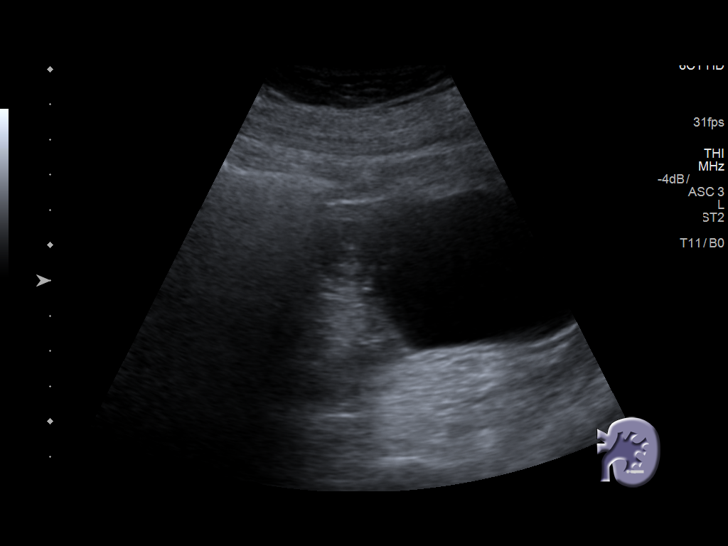
[im 22/27]
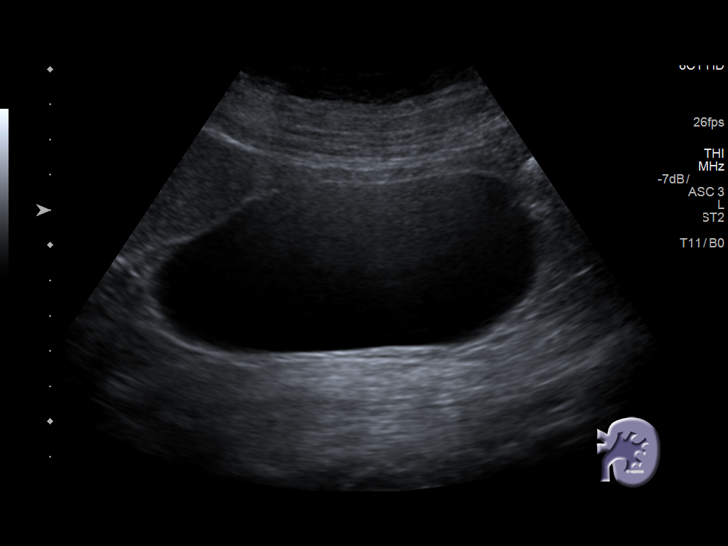
[im 24/27]
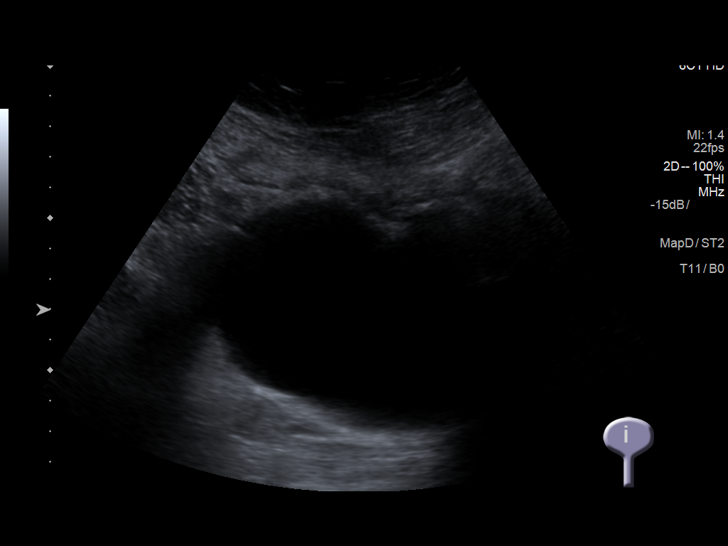
[im 27/27]
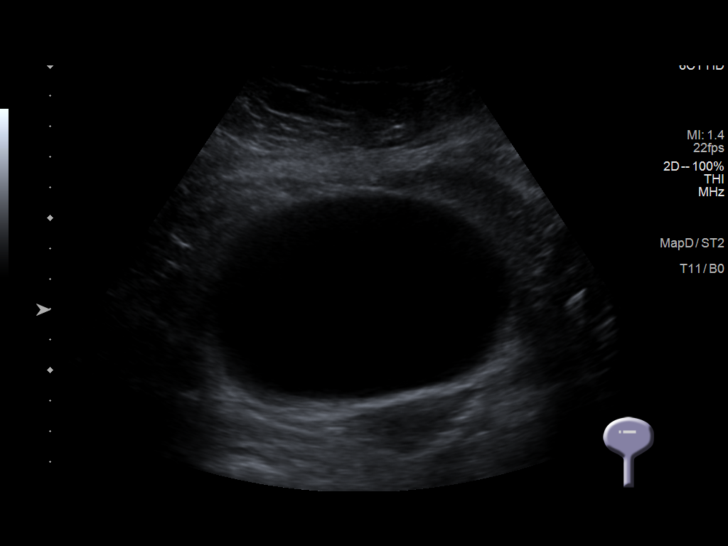

[14 of 25 positions shown; findings below may reference images not displayed]

FINDINGS: Examination was limited by patient condition and positioning.

Right Kidney:

Length: 11.2 cm. Suboptimal assessment of the right kidney without
hydronephrosis or gross mass.

Left Kidney:

Length: 9.8 cm. Echogenicity within normal limits. No
hydronephrosis. 11.1 cm cyst, similar in size to the prior study.

Bladder:

Appears normal for degree of bladder distention.
IMPRESSION: 1. Suboptimal assessment of the right kidney.  No hydronephrosis.
2. Chronic large left renal cyst.

## 2020-05-20 IMAGING — CT CT CERVICAL SPINE W/O CM
4 of 7 series · 15 of 33 positions shown, 16 images · non-contrast
Comparison: Head CT 02/20/2018

CLINICAL DATA: Unwitnessed fall. Found on bathroom floor by family.
Altered mental status.

EXAM:
CT HEAD WITHOUT CONTRAST
CT CERVICAL SPINE WITHOUT CONTRAST
TECHNIQUE: Multidetector CT imaging of the head and cervical spine was
performed following the standard protocol without intravenous
contrast. Multiplanar CT image reconstructions of the cervical spine
were also generated.

[Series 7: c spine soft · axial · 0.23mm/px · z∈[-284,-220]mm · 3 of 82 slices shown]
[im 17/82  soft-tissue]
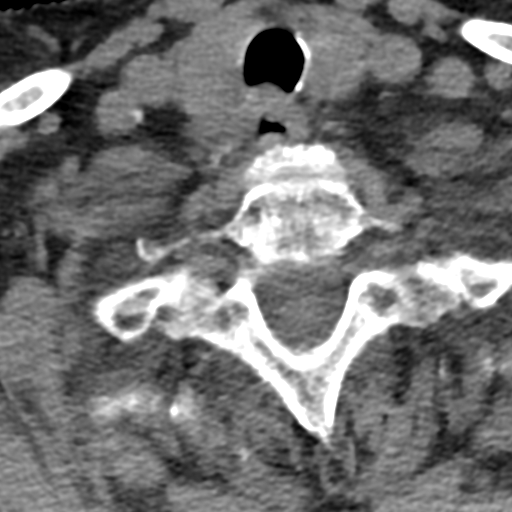
[im 33/82  soft-tissue]
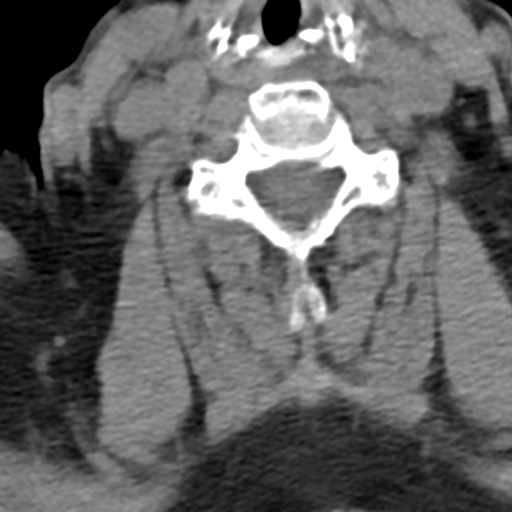
[im 49/82  soft-tissue]
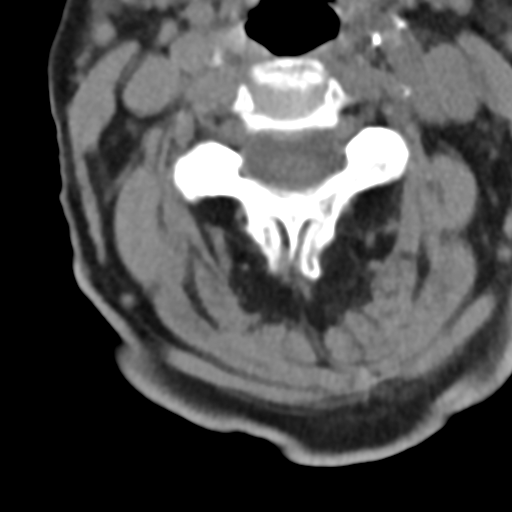

[Series 8: sagittal bone · sagittal · 0.23mm/px · 5 of 59 slices shown]
[im 10/59  bone]
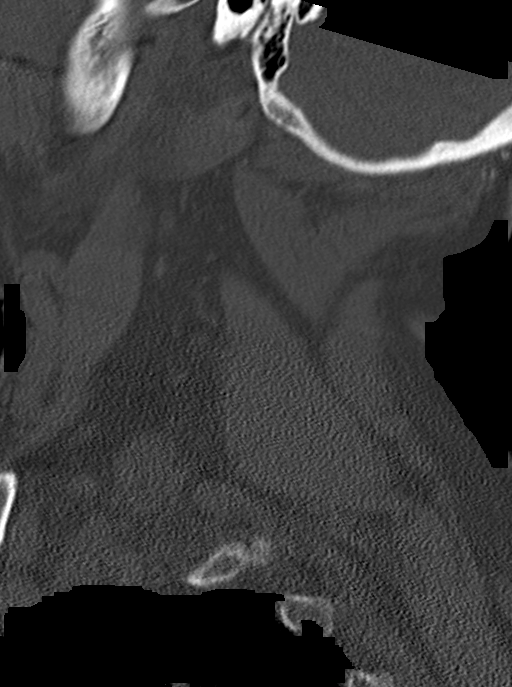
[im 20/59  bone]
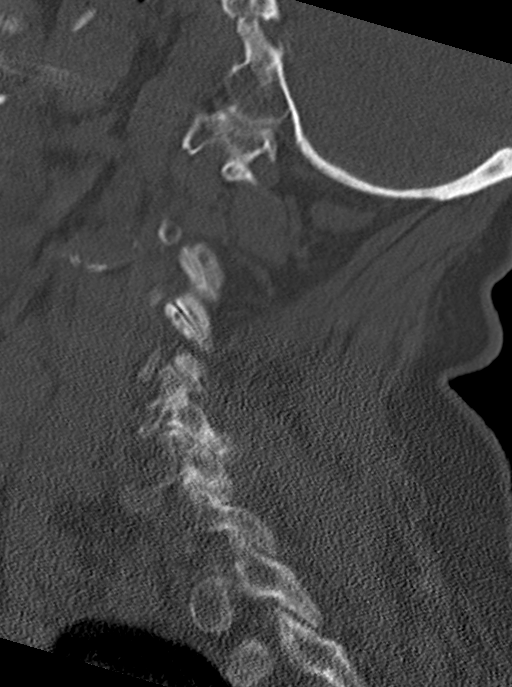
[im 30/59  bone]
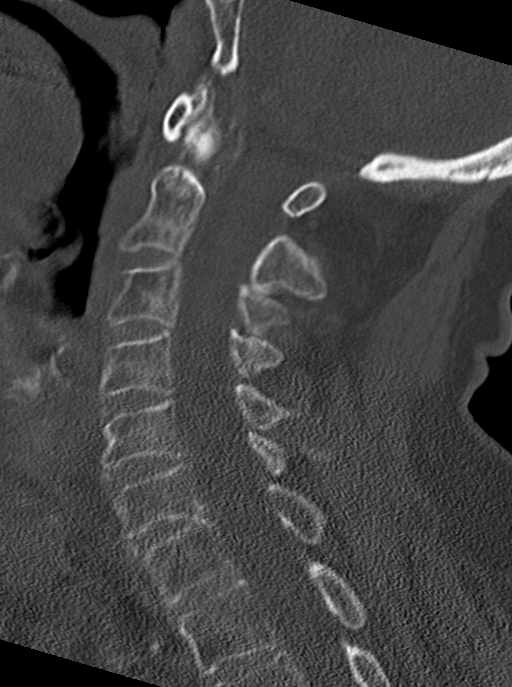
[im 39/59  bone]
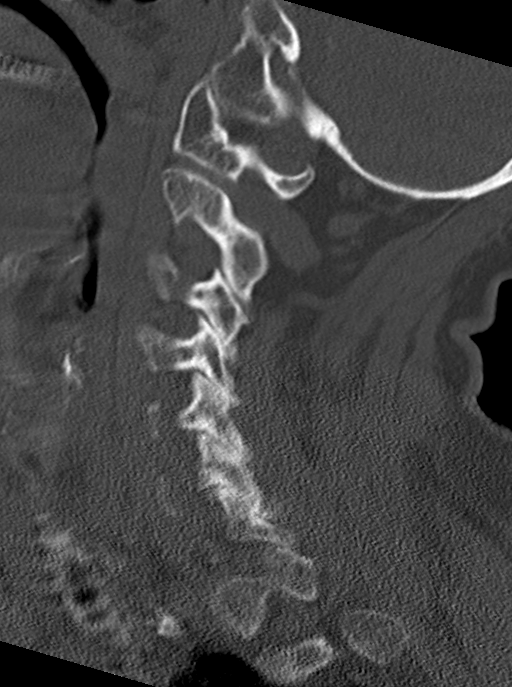
[im 49/59  bone]
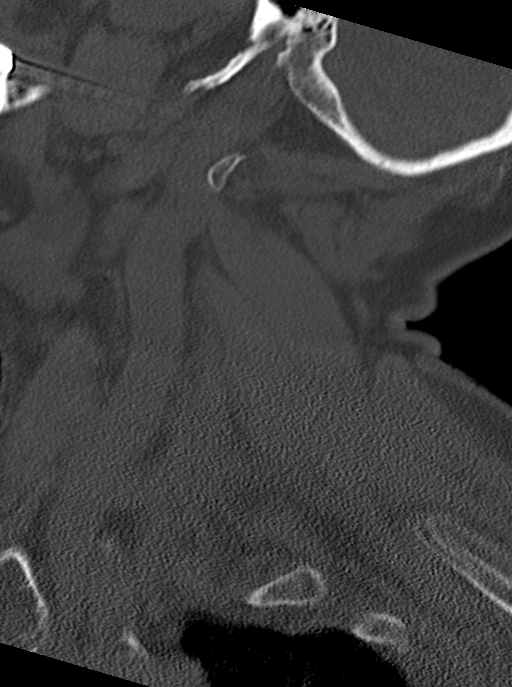

[Series 9: coronal bone · coronal · 0.26mm/px · 3 of 65 slices shown]
[im 17/65  bone]
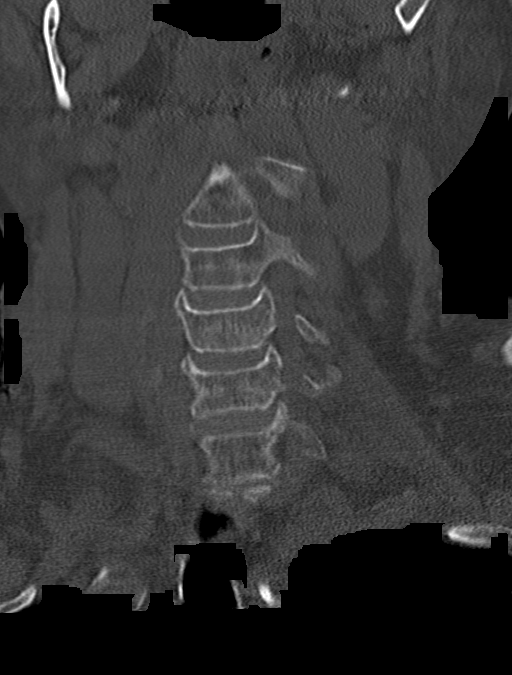
[im 33/65  bone]
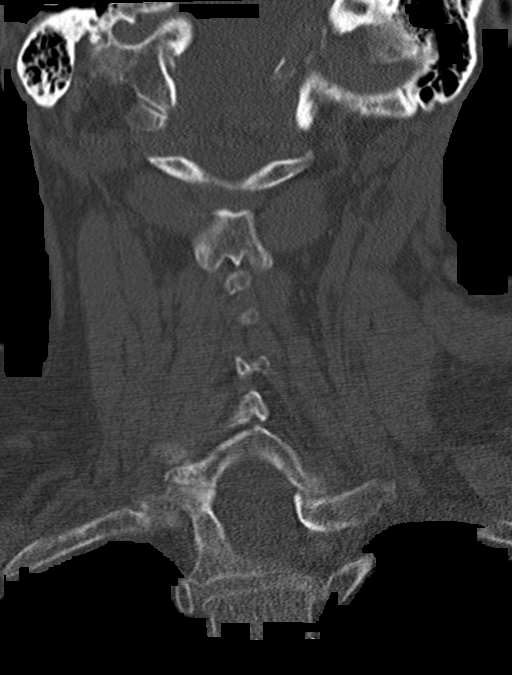
[im 49/65  bone]
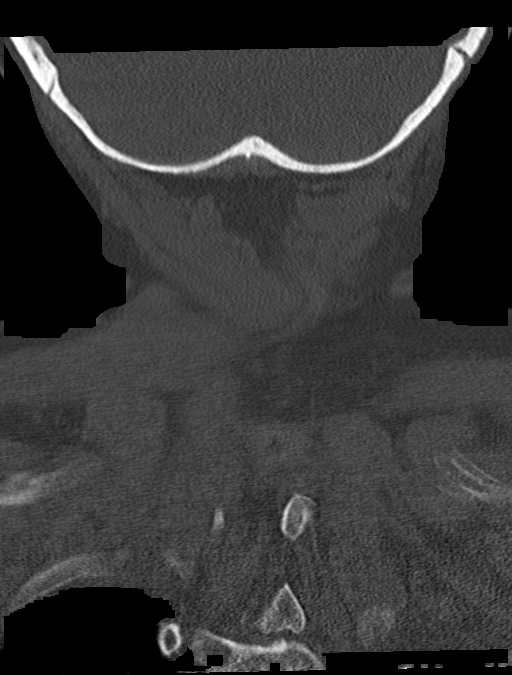

[Series 10: orthogonal bone · axial · 0.25mm/px · z∈[-302,-206]mm · 4 of 87 slices shown, 5 images]
[im 18/87  soft-tissue]
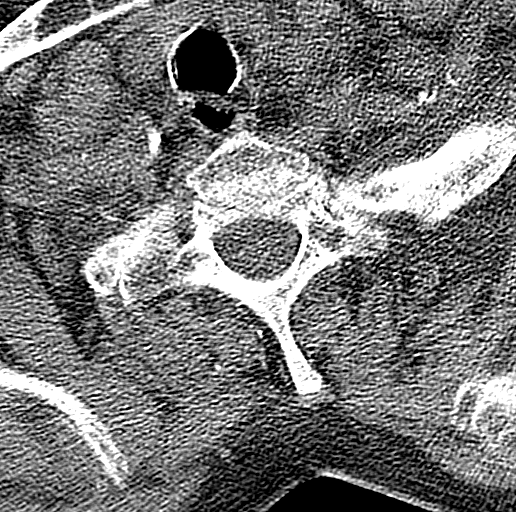
[im 18/87  bone]
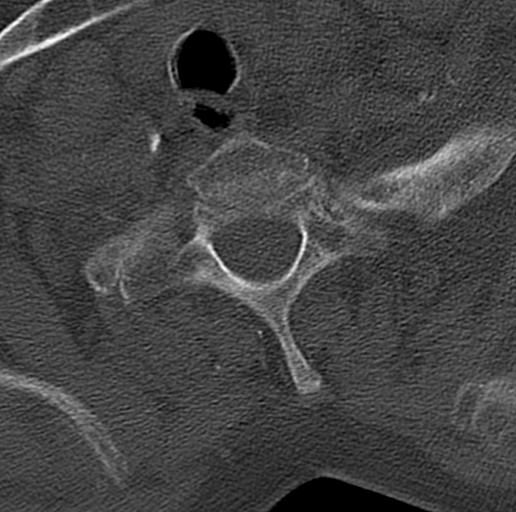
[im 35/87  bone]
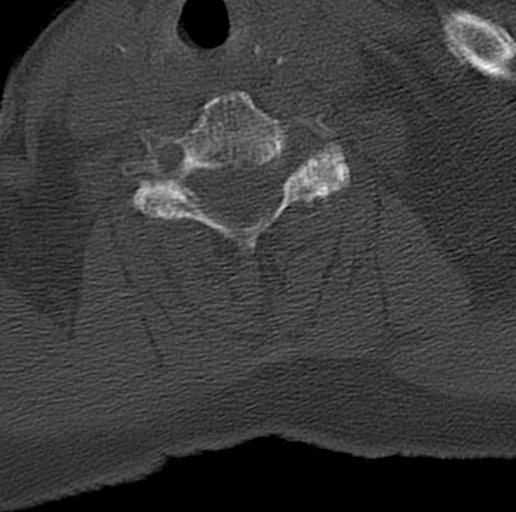
[im 52/87  bone]
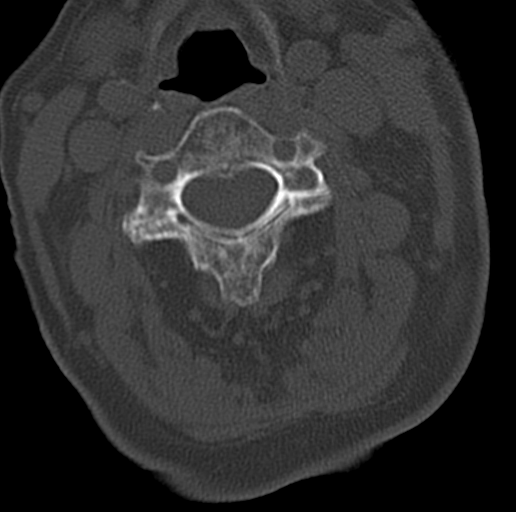
[im 69/87  bone]
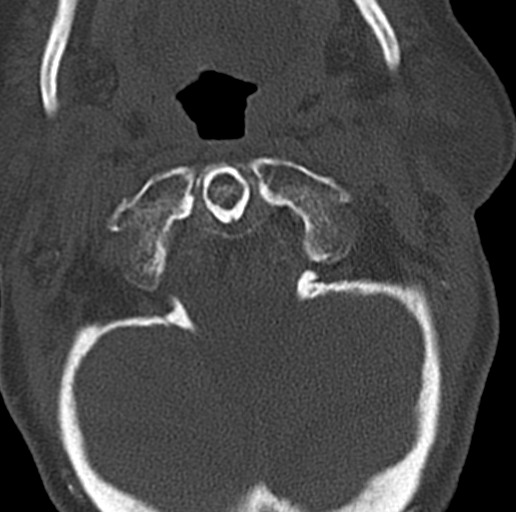

[15 of 33 positions shown; findings below may reference images not displayed]

FINDINGS: CT HEAD FINDINGS

Brain: Generalized atrophy unchanged from prior exam. Similar degree
of chronic small vessel ischemia. No intracranial hemorrhage, mass
effect, or midline shift. No hydrocephalus. The basilar cisterns are
patent. No evidence of territorial infarct or acute ischemia. No
extra-axial or intracranial fluid collection. Minimal air in the
region of the cavernous sinus, typically seen with IV catheter
placement.

Vascular: Atherosclerosis of skullbase vasculature without
hyperdense vessel or abnormal calcification.

Skull: No fracture or focal lesion.

Sinuses/Orbits: No sinus fluid level or signs of inflammation.
Orbits are unremarkable.

Other: None.

CT CERVICAL SPINE FINDINGS

Alignment: Normal.

Skull base and vertebrae: No acute fracture. Vertebral body heights
are maintained. The dens and skull base are intact.

Soft tissues and spinal canal: No prevertebral fluid or swelling. No
visible canal hematoma.

Disc levels: Mild diffuse endplate spurring. Disc space narrowing at
C6-C7. Scattered facet arthropathy.

Upper chest: Carotid calcifications.

Other: None.
IMPRESSION: 1. No acute intracranial abnormality. Unchanged atrophy and chronic
small vessel ischemia.
2. Degenerative change in the cervical spine without acute fracture
or subluxation.

## 2020-05-21 IMAGING — DX DG CHEST 1V PORT
1 series · 1 of 1 positions shown · non-contrast
Comparison: Radiograph April 11, 2018.

CLINICAL DATA: PICC placement.

EXAM:
PORTABLE CHEST 1 VIEW

[chest ap]
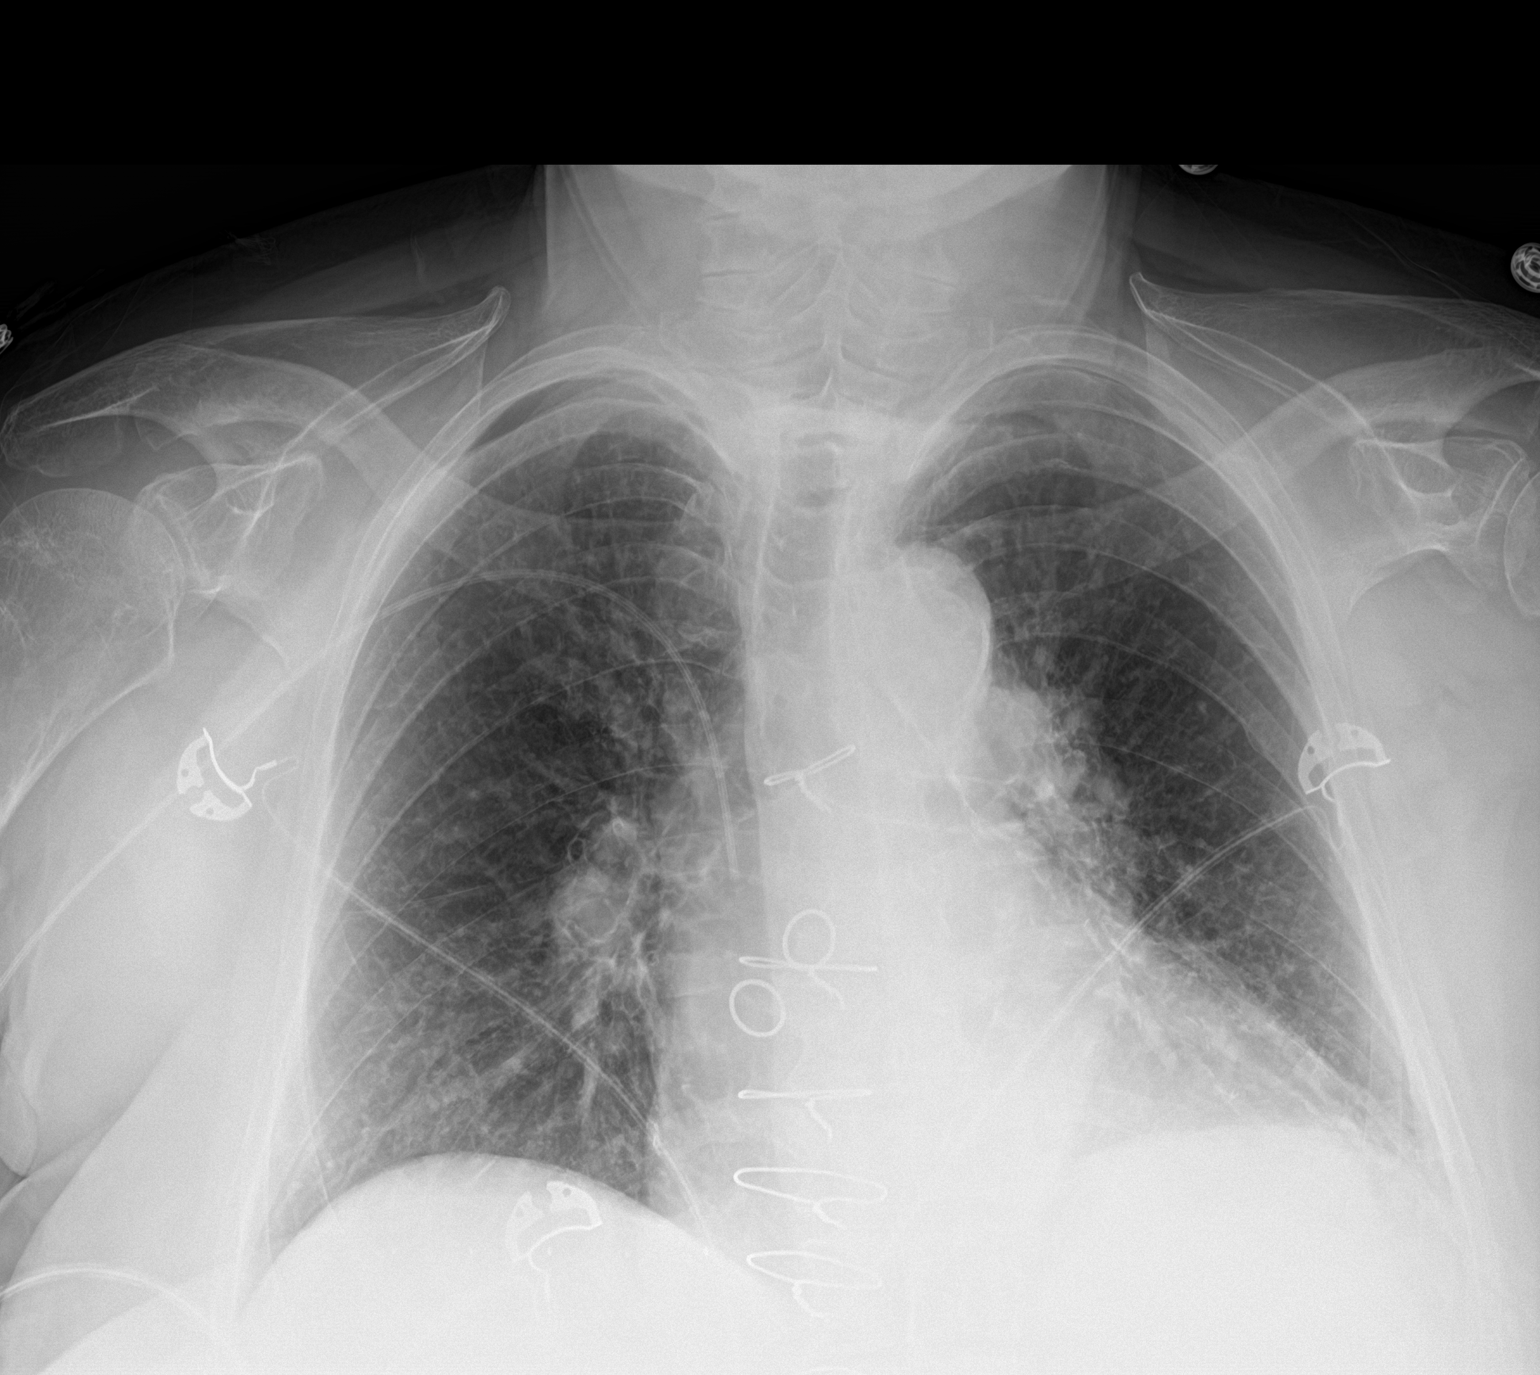

[1 of 1 positions shown; findings below may reference images not displayed]

FINDINGS: Stable cardiomegaly. Atherosclerosis of thoracic aorta is noted. No
pneumothorax or pleural effusion is noted. Interval placement of
right-sided PICC line with distal tip in expected position of SVC.
No acute pulmonary disease is noted. Bony thorax is unremarkable.
IMPRESSION: Interval placement of right-sided PICC line with distal tip in
expected position of the SVC.
